# Patient Record
Sex: Male | Born: 1935 | Race: White | Hispanic: No | Marital: Single | Smoking: Former smoker
Health system: Southern US, Community
[De-identification: ages and names within clinical notes are randomized; demographics above are authoritative.]

## PROBLEM LIST (undated history)

## (undated) DIAGNOSIS — Z7401 Bed confinement status: Secondary | ICD-10-CM

## (undated) DIAGNOSIS — J189 Pneumonia, unspecified organism: Secondary | ICD-10-CM

## (undated) DIAGNOSIS — J45909 Unspecified asthma, uncomplicated: Secondary | ICD-10-CM

## (undated) DIAGNOSIS — K219 Gastro-esophageal reflux disease without esophagitis: Secondary | ICD-10-CM

## (undated) DIAGNOSIS — I10 Essential (primary) hypertension: Secondary | ICD-10-CM

## (undated) DIAGNOSIS — F0390 Unspecified dementia without behavioral disturbance: Secondary | ICD-10-CM

## (undated) DIAGNOSIS — Z66 Do not resuscitate: Secondary | ICD-10-CM

## (undated) DIAGNOSIS — E785 Hyperlipidemia, unspecified: Secondary | ICD-10-CM

## (undated) DIAGNOSIS — F419 Anxiety disorder, unspecified: Secondary | ICD-10-CM

---

## 2003-06-19 ENCOUNTER — Other Ambulatory Visit: Payer: Self-pay

## 2006-02-10 ENCOUNTER — Emergency Department: Payer: Self-pay | Admitting: Emergency Medicine

## 2006-02-21 ENCOUNTER — Emergency Department: Payer: Self-pay | Admitting: Emergency Medicine

## 2007-05-29 ENCOUNTER — Emergency Department: Payer: Self-pay | Admitting: Emergency Medicine

## 2008-02-04 ENCOUNTER — Emergency Department: Payer: Self-pay | Admitting: Emergency Medicine

## 2008-02-05 ENCOUNTER — Emergency Department (HOSPITAL_COMMUNITY): Admission: EM | Admit: 2008-02-05 | Discharge: 2008-02-05 | Payer: Self-pay | Admitting: Emergency Medicine

## 2009-07-25 ENCOUNTER — Ambulatory Visit: Payer: Self-pay | Admitting: Internal Medicine

## 2011-04-06 ENCOUNTER — Emergency Department (HOSPITAL_COMMUNITY)
Admission: EM | Admit: 2011-04-06 | Discharge: 2011-04-06 | Disposition: A | Discharge status: Home / Self Care | Payer: Medicare Other | Attending: Emergency Medicine | Admitting: Emergency Medicine

## 2011-04-06 ENCOUNTER — Emergency Department (HOSPITAL_COMMUNITY): Payer: Medicare Other

## 2011-04-06 DIAGNOSIS — R55 Syncope and collapse: Secondary | ICD-10-CM | POA: Insufficient documentation

## 2011-04-06 DIAGNOSIS — R5381 Other malaise: Secondary | ICD-10-CM | POA: Insufficient documentation

## 2011-04-06 DIAGNOSIS — R42 Dizziness and giddiness: Secondary | ICD-10-CM | POA: Insufficient documentation

## 2011-04-06 LAB — CK TOTAL AND CKMB (NOT AT ARMC)
CK, MB: 1.6 ng/mL (ref 0.3–4.0)
Total CK: 55 U/L (ref 7–232)

## 2011-04-06 LAB — DIFFERENTIAL
Basophils Absolute: 0 10*3/uL (ref 0.0–0.1)
Basophils Relative: 0 % (ref 0–1)
Eosinophils Absolute: 0.1 10*3/uL (ref 0.0–0.7)
Eosinophils Relative: 1 % (ref 0–5)
Monocytes Absolute: 1 10*3/uL (ref 0.1–1.0)
Neutro Abs: 6.8 10*3/uL (ref 1.7–7.7)

## 2011-04-06 LAB — URINALYSIS, ROUTINE W REFLEX MICROSCOPIC
Hgb urine dipstick: NEGATIVE
Protein, ur: >300 mg/dL — AB
Urobilinogen, UA: 1 mg/dL (ref 0.0–1.0)

## 2011-04-06 LAB — CBC
Hemoglobin: 13.2 g/dL (ref 13.0–17.0)
MCHC: 35.3 g/dL (ref 30.0–36.0)
Platelets: 280 10*3/uL (ref 150–400)
RDW: 13.6 % (ref 11.5–15.5)

## 2011-04-06 LAB — POCT I-STAT TROPONIN I: Troponin i, poc: 0 ng/mL (ref 0.00–0.08)

## 2011-04-06 LAB — POCT I-STAT, CHEM 8
Calcium, Ion: 1.12 mmol/L (ref 1.12–1.32)
Chloride: 99 mEq/L (ref 96–112)
HCT: 37 % — ABNORMAL LOW (ref 39.0–52.0)
Hemoglobin: 12.6 g/dL — ABNORMAL LOW (ref 13.0–17.0)

## 2011-04-06 LAB — URINE MICROSCOPIC-ADD ON

## 2011-04-07 LAB — URINE CULTURE: Culture: NO GROWTH

## 2011-05-08 LAB — POCT CARDIAC MARKERS
CKMB, poc: <1 — ABNORMAL LOW
Myoglobin, poc: 37.3
Operator id: 133351

## 2011-09-29 ENCOUNTER — Emergency Department: Payer: Self-pay | Admitting: Emergency Medicine

## 2011-09-29 LAB — URINALYSIS, COMPLETE
Bilirubin,UR: NEGATIVE
Glucose,UR: 150 mg/dL (ref 0–75)
Hyaline Cast: 3
Ph: 6 (ref 4.5–8.0)
Protein: 100
RBC,UR: 4 /HPF (ref 0–5)
Specific Gravity: 1.019 (ref 1.003–1.030)
WBC UR: 2 /HPF (ref 0–5)

## 2011-09-29 LAB — COMPREHENSIVE METABOLIC PANEL
Albumin: 3.8 g/dL (ref 3.4–5.0)
Anion Gap: 11 (ref 7–16)
BUN: 8 mg/dL (ref 7–18)
Chloride: 100 mmol/L (ref 98–107)
Creatinine: 0.75 mg/dL (ref 0.60–1.30)
Glucose: 100 mg/dL — ABNORMAL HIGH (ref 65–99)
Osmolality: 278 (ref 275–301)
Potassium: 3.4 mmol/L — ABNORMAL LOW (ref 3.5–5.1)
SGPT (ALT): 18 U/L
Sodium: 140 mmol/L (ref 136–145)
Total Protein: 7.9 g/dL (ref 6.4–8.2)

## 2011-09-29 LAB — CBC
HCT: 40.7 % (ref 40.0–52.0)
MCH: 31.4 pg (ref 26.0–34.0)
MCHC: 34.3 g/dL (ref 32.0–36.0)
Platelet: 261 10*3/uL (ref 150–440)
RDW: 14.1 % (ref 11.5–14.5)

## 2011-09-29 LAB — TROPONIN I: Troponin-I: 0.04 ng/mL

## 2011-09-29 LAB — CK TOTAL AND CKMB (NOT AT ARMC)
CK, Total: 84 U/L (ref 35–232)
CK-MB: 1.1 ng/mL (ref 0.5–3.6)

## 2012-02-13 LAB — COMPREHENSIVE METABOLIC PANEL
Alkaline Phosphatase: 83 U/L (ref 50–136)
BUN: 6 mg/dL — ABNORMAL LOW (ref 7–18)
Calcium, Total: 9.3 mg/dL (ref 8.5–10.1)
Chloride: 100 mmol/L (ref 98–107)
Co2: 32 mmol/L (ref 21–32)
EGFR (Non-African Amer.): >60
SGPT (ALT): 15 U/L
Sodium: 140 mmol/L (ref 136–145)

## 2012-02-13 LAB — URINALYSIS, COMPLETE
Bacteria: NONE SEEN
Blood: NEGATIVE
Glucose,UR: 150 mg/dL (ref 0–75)
Leukocyte Esterase: NEGATIVE
Nitrite: NEGATIVE
RBC,UR: 8 /HPF (ref 0–5)
WBC UR: <1 /HPF (ref 0–5)

## 2012-02-13 LAB — CBC
HGB: 14.1 g/dL (ref 13.0–18.0)
MCHC: 32.8 g/dL (ref 32.0–36.0)
Platelet: 267 10*3/uL (ref 150–440)
RDW: 13.8 % (ref 11.5–14.5)

## 2012-02-13 LAB — LIPASE, BLOOD: Lipase: 123 U/L (ref 73–393)

## 2012-02-14 ENCOUNTER — Inpatient Hospital Stay: Payer: Self-pay | Admitting: Surgery

## 2012-02-14 LAB — BASIC METABOLIC PANEL
Anion Gap: 7 (ref 7–16)
BUN: 5 mg/dL — ABNORMAL LOW (ref 7–18)
Creatinine: 1.01 mg/dL (ref 0.60–1.30)
EGFR (African American): >60
EGFR (Non-African Amer.): >60
Glucose: 130 mg/dL — ABNORMAL HIGH (ref 65–99)
Sodium: 136 mmol/L (ref 136–145)

## 2012-02-14 LAB — CBC WITH DIFFERENTIAL/PLATELET
Basophil %: 0.4 %
Eosinophil #: 0.1 10*3/uL (ref 0.0–0.7)
Eosinophil %: 0.7 %
HCT: 41.9 % (ref 40.0–52.0)
HGB: 13.7 g/dL (ref 13.0–18.0)
Lymphocyte #: 1.7 10*3/uL (ref 1.0–3.6)
MCH: 30.5 pg (ref 26.0–34.0)
MCHC: 32.7 g/dL (ref 32.0–36.0)
MCV: 93 fL (ref 80–100)
Monocyte #: 1.3 x10 3/mm — ABNORMAL HIGH (ref 0.2–1.0)
Neutrophil #: 7.3 10*3/uL — ABNORMAL HIGH (ref 1.4–6.5)
Neutrophil %: 70.1 %
RDW: 14 % (ref 11.5–14.5)

## 2012-02-15 LAB — COMPREHENSIVE METABOLIC PANEL WITH GFR
Albumin: 3 g/dL — ABNORMAL LOW
Alkaline Phosphatase: 80 U/L
Anion Gap: 9
BUN: 4 mg/dL — ABNORMAL LOW
Bilirubin,Total: 1.3 mg/dL — ABNORMAL HIGH
Calcium, Total: 8.2 mg/dL — ABNORMAL LOW
Chloride: 102 mmol/L
Co2: 26 mmol/L
Creatinine: 0.79 mg/dL
EGFR (African American): >60
EGFR (Non-African Amer.): >60
Glucose: 115 mg/dL — ABNORMAL HIGH
Osmolality: 272
Potassium: 3.6 mmol/L
SGOT(AST): 17 U/L
SGPT (ALT): 10 U/L — ABNORMAL LOW
Sodium: 137 mmol/L
Total Protein: 6.9 g/dL

## 2012-02-15 LAB — CBC WITH DIFFERENTIAL/PLATELET
Basophil #: 0 x10 3/mm 3
Basophil %: 0.5 %
Eosinophil #: 0.1 x10 3/mm 3
Eosinophil %: 1.3 %
HCT: 38 % — ABNORMAL LOW
HGB: 13.1 g/dL
Lymphocyte %: 12 %
Lymphs Abs: 1.2 x10 3/mm 3
MCH: 31.8 pg
MCHC: 34.3 g/dL
MCV: 93 fL
Monocyte #: 1.3 "x10 3/mm " — ABNORMAL HIGH
Monocyte %: 13.2 %
Neutrophil #: 7.3 x10 3/mm 3 — ABNORMAL HIGH
Neutrophil %: 73 %
Platelet: 195 x10 3/mm 3
RBC: 4.1 x10 6/mm 3 — ABNORMAL LOW
RDW: 13.8 %
WBC: 10 x10 3/mm 3

## 2012-02-17 LAB — CBC WITH DIFFERENTIAL/PLATELET
Basophil %: 0.9 %
Eosinophil #: 0.4 10*3/uL (ref 0.0–0.7)
Eosinophil %: 6.5 %
Lymphocyte #: 1.9 10*3/uL (ref 1.0–3.6)
Lymphocyte %: 29.8 %
MCH: 31.5 pg (ref 26.0–34.0)
MCHC: 33.9 g/dL (ref 32.0–36.0)
MCV: 93 fL (ref 80–100)
Monocyte #: 0.7 x10 3/mm (ref 0.2–1.0)
Monocyte %: 11.4 %
Neutrophil %: 51.4 %
Platelet: 192 10*3/uL (ref 150–440)
RBC: 3.83 10*6/uL — ABNORMAL LOW (ref 4.40–5.90)
RDW: 14.2 % (ref 11.5–14.5)
WBC: 6.3 10*3/uL (ref 3.8–10.6)

## 2012-02-17 LAB — COMPREHENSIVE METABOLIC PANEL
Albumin: 2.6 g/dL — ABNORMAL LOW (ref 3.4–5.0)
Alkaline Phosphatase: 70 U/L (ref 50–136)
BUN: 4 mg/dL — ABNORMAL LOW (ref 7–18)
Bilirubin,Total: 0.8 mg/dL (ref 0.2–1.0)
Co2: 26 mmol/L (ref 21–32)
Creatinine: 0.73 mg/dL (ref 0.60–1.30)
EGFR (Non-African Amer.): >60
Glucose: 88 mg/dL (ref 65–99)
Osmolality: 278 (ref 275–301)
Potassium: 3.9 mmol/L (ref 3.5–5.1)
SGPT (ALT): 12 U/L
Sodium: 141 mmol/L (ref 136–145)
Total Protein: 6.7 g/dL (ref 6.4–8.2)

## 2013-02-12 ENCOUNTER — Emergency Department (HOSPITAL_COMMUNITY)
Admission: EM | Admit: 2013-02-12 | Discharge: 2013-02-12 | Disposition: A | Discharge status: Home / Self Care | Payer: Medicare Other | Attending: Emergency Medicine | Admitting: Emergency Medicine

## 2013-02-12 ENCOUNTER — Emergency Department (HOSPITAL_COMMUNITY): Payer: Medicare Other

## 2013-02-12 ENCOUNTER — Encounter (HOSPITAL_COMMUNITY): Payer: Self-pay | Admitting: *Deleted

## 2013-02-12 DIAGNOSIS — Z79899 Other long term (current) drug therapy: Secondary | ICD-10-CM | POA: Insufficient documentation

## 2013-02-12 DIAGNOSIS — R509 Fever, unspecified: Secondary | ICD-10-CM | POA: Insufficient documentation

## 2013-02-12 DIAGNOSIS — Y9389 Activity, other specified: Secondary | ICD-10-CM | POA: Insufficient documentation

## 2013-02-12 DIAGNOSIS — IMO0002 Reserved for concepts with insufficient information to code with codable children: Secondary | ICD-10-CM | POA: Insufficient documentation

## 2013-02-12 DIAGNOSIS — W19XXXA Unspecified fall, initial encounter: Secondary | ICD-10-CM

## 2013-02-12 DIAGNOSIS — K219 Gastro-esophageal reflux disease without esophagitis: Secondary | ICD-10-CM | POA: Insufficient documentation

## 2013-02-12 DIAGNOSIS — S40012A Contusion of left shoulder, initial encounter: Secondary | ICD-10-CM

## 2013-02-12 DIAGNOSIS — S0003XA Contusion of scalp, initial encounter: Secondary | ICD-10-CM | POA: Insufficient documentation

## 2013-02-12 DIAGNOSIS — Y9289 Other specified places as the place of occurrence of the external cause: Secondary | ICD-10-CM | POA: Insufficient documentation

## 2013-02-12 DIAGNOSIS — S0083XA Contusion of other part of head, initial encounter: Secondary | ICD-10-CM

## 2013-02-12 DIAGNOSIS — Z7982 Long term (current) use of aspirin: Secondary | ICD-10-CM | POA: Insufficient documentation

## 2013-02-12 DIAGNOSIS — S40019A Contusion of unspecified shoulder, initial encounter: Secondary | ICD-10-CM | POA: Insufficient documentation

## 2013-02-12 DIAGNOSIS — F0390 Unspecified dementia without behavioral disturbance: Secondary | ICD-10-CM | POA: Insufficient documentation

## 2013-02-12 DIAGNOSIS — R296 Repeated falls: Secondary | ICD-10-CM | POA: Insufficient documentation

## 2013-02-12 HISTORY — DX: Unspecified dementia, unspecified severity, without behavioral disturbance, psychotic disturbance, mood disturbance, and anxiety: F03.90

## 2013-02-12 HISTORY — DX: Gastro-esophageal reflux disease without esophagitis: K21.9

## 2013-02-12 NOTE — ED Notes (Signed)
Family member states pt was outside earlier and fell (usure what caused the fall) Pain to left arm, limited mobility per family member. C/O right leg hurting earlier per family member. Abrasions to the face. NAD. Temp of 100.5 in triage.

## 2013-02-12 NOTE — ED Provider Notes (Signed)
History    CSN: [id number] Arrival date & time 02/12/13  1657  First MD Initiated Contact with Patient 02/12/13 1727     Chief Complaint  Patient presents with  . Fall  . Fever   (Consider location/radiation/quality/duration/timing/severity/associated sxs/prior Treatment) Patient is a 79 y.o. male presenting with fall and fever. The history is provided by a relative (pt fell in the grass and scraped his face and has left shoulder pain).  Fall This is a new problem. The current episode started 6 to 12 hours ago. The problem occurs constantly. The problem has not changed since onset.The symptoms are aggravated by bending. Nothing relieves the symptoms.  Fever  Past Medical History  Diagnosis Date  . Dementia   . GERD (gastroesophageal reflux disease)    History reviewed. No pertinent past surgical history. No family history on file. History  Substance Use Topics  . Smoking status: Never Smoker   . Smokeless tobacco: Not on file  . Alcohol Use: No    Review of Systems  Unable to perform ROS: Dementia    Allergies  Review of patient's allergies indicates no known allergies.  Home Medications   Current Outpatient Rx  Name  Route  Sig  Dispense  Refill  . aspirin EC 81 MG tablet   Oral   Take 81 mg by mouth daily.         . mirtazapine (REMERON) 15 MG tablet   Oral   Take 15 mg by mouth at bedtime.         Marland Kitchen omeprazole (PRILOSEC) 20 MG capsule   Oral   Take 20 mg by mouth daily.         . risperiDONE (RISPERDAL) 1 MG tablet   Oral   Take 1 mg by mouth at bedtime.         . senna (SENOKOT) 8.6 MG tablet   Oral   Take 2 tablets by mouth 2 (two) times daily.          BP 144/79  Pulse 93  Temp(Src) 100.5 F (38.1 C) (Oral)  Resp 20  Ht 5\' 7"  (1.702 m)  Wt 155 lb (70.308 kg)  BMI 24.27 kg/m2  SpO2 98% Physical Exam  Constitutional: He is oriented to person, place, and time. He appears well-developed.  HENT:  Abrasions to face  Eyes:  Conjunctivae and EOM are normal. No scleral icterus.  Neck: Neck supple. No thyromegaly present.  Cardiovascular: Normal rate and regular rhythm.  Exam reveals no gallop and no friction rub.   No murmur heard. Pulmonary/Chest: No stridor. He has no wheezes. He has no rales. He exhibits no tenderness.  Abdominal: He exhibits no distension. There is no tenderness. There is no rebound.  Musculoskeletal: He exhibits no edema.  Tender left shoulder  Lymphadenopathy:    He has no cervical adenopathy.  Neurological: He is oriented to person, place, and time. Coordination normal.  Skin: No rash noted. No erythema.  Psychiatric: He has a normal mood and affect. His behavior is normal.    ED Course  Procedures (including critical care time) Labs Reviewed - No data to display Dg Elbow Complete Left  02/12/2013   *RADIOLOGY REPORT*  Clinical Data: Elbow pain after fall.  LEFT ELBOW - COMPLETE 3+ VIEW  Comparison: None.  Findings: No fracture, foreign body, or acute bony findings are identified.  No elbow effusion noted.  IMPRESSION:  No significant abnormality identified.   Original Report Authenticated By: Gaylyn Rong, M.D.  Dg Shoulder Left  02/12/2013   *RADIOLOGY REPORT*  Clinical Data: Left shoulder pain. Fall.  LEFT SHOULDER - 2+ VIEW  Comparison: 02/05/2008  Findings: No fracture, dislocation, or acute bony findings identified.  IMPRESSION:  1.  No acute bony findings are identified.   Original Report Authenticated By: Gaylyn Rong, M.D.   1. Fall, initial encounter   2. Facial contusion, initial encounter   3. Shoulder contusion, left, initial encounter     MDM    Benny Lennert, MD 02/12/13 [phone]

## 2013-05-16 ENCOUNTER — Other Ambulatory Visit: Payer: Self-pay

## 2013-05-16 ENCOUNTER — Observation Stay (HOSPITAL_COMMUNITY)
Admission: EM | Admit: 2013-05-16 | Discharge: 2013-05-18 | Disposition: A | Discharge status: Home / Self Care | Payer: Medicare Other | Attending: Internal Medicine | Admitting: Internal Medicine

## 2013-05-16 ENCOUNTER — Encounter (HOSPITAL_COMMUNITY): Payer: Self-pay | Admitting: Emergency Medicine

## 2013-05-16 DIAGNOSIS — F0390 Unspecified dementia without behavioral disturbance: Secondary | ICD-10-CM | POA: Diagnosis present

## 2013-05-16 DIAGNOSIS — R42 Dizziness and giddiness: Secondary | ICD-10-CM | POA: Insufficient documentation

## 2013-05-16 DIAGNOSIS — K219 Gastro-esophageal reflux disease without esophagitis: Secondary | ICD-10-CM | POA: Diagnosis present

## 2013-05-16 DIAGNOSIS — R5381 Other malaise: Secondary | ICD-10-CM | POA: Insufficient documentation

## 2013-05-16 DIAGNOSIS — R404 Transient alteration of awareness: Principal | ICD-10-CM | POA: Insufficient documentation

## 2013-05-16 DIAGNOSIS — Z7982 Long term (current) use of aspirin: Secondary | ICD-10-CM | POA: Insufficient documentation

## 2013-05-16 DIAGNOSIS — Z79899 Other long term (current) drug therapy: Secondary | ICD-10-CM | POA: Insufficient documentation

## 2013-05-16 DIAGNOSIS — R55 Syncope and collapse: Secondary | ICD-10-CM | POA: Diagnosis present

## 2013-05-16 DIAGNOSIS — R0602 Shortness of breath: Secondary | ICD-10-CM | POA: Insufficient documentation

## 2013-05-16 DIAGNOSIS — F411 Generalized anxiety disorder: Secondary | ICD-10-CM | POA: Insufficient documentation

## 2013-05-16 DIAGNOSIS — F419 Anxiety disorder, unspecified: Secondary | ICD-10-CM

## 2013-05-16 DIAGNOSIS — R079 Chest pain, unspecified: Secondary | ICD-10-CM | POA: Insufficient documentation

## 2013-05-16 HISTORY — DX: Anxiety disorder, unspecified: F41.9

## 2013-05-16 LAB — COMPREHENSIVE METABOLIC PANEL
ALT: 9 U/L (ref 0–53)
AST: 14 U/L (ref 0–37)
Albumin: 3.6 g/dL (ref 3.5–5.2)
Alkaline Phosphatase: 94 U/L (ref 39–117)
Calcium: 9.7 mg/dL (ref 8.4–10.5)
GFR calc Af Amer: >90 mL/min (ref >90)
Glucose, Bld: 134 mg/dL — ABNORMAL HIGH (ref 70–99)
Potassium: 4.2 mEq/L (ref 3.5–5.1)
Sodium: 136 mEq/L (ref 135–145)
Total Protein: 8 g/dL (ref 6.0–8.3)

## 2013-05-16 LAB — CBC WITH DIFFERENTIAL/PLATELET
Basophils Absolute: 0 10*3/uL (ref 0.0–0.1)
Eosinophils Absolute: 0.1 10*3/uL (ref 0.0–0.7)
Eosinophils Relative: 1 % (ref 0–5)
Lymphs Abs: 1.6 10*3/uL (ref 0.7–4.0)
MCH: 31.6 pg (ref 26.0–34.0)
Neutrophils Relative %: 71 % (ref 43–77)
Platelets: 287 10*3/uL (ref 150–400)
RBC: 4.87 MIL/uL (ref 4.22–5.81)
RDW: 13.8 % (ref 11.5–15.5)
WBC: 8.5 10*3/uL (ref 4.0–10.5)

## 2013-05-16 NOTE — ED Notes (Signed)
Patient presents to ER via [facility] s/p syncope.  Patient was in shower and when he got out, he called out for his daughter and had a syncopal episode.  Patient was caught by family; denies hitting head.  CBG 109.

## 2013-05-16 NOTE — ED Provider Notes (Signed)
CSN: [id number]     Arrival date & time 05/16/13  2130 History   None    Chief Complaint  Patient presents with  . Loss of Consciousness   (Consider location/radiation/quality/duration/timing/severity/associated sxs/prior Treatment) Patient is a 79 y.o. male presenting with syncope. The history is provided by the patient and the EMS personnel. No language interpreter was used.  Loss of Consciousness Episode history:  Single Most recent episode:  Today Progression:  Worsening Chronicity:  New Witnessed: yes   Relieved by:  Nothing Worsened by:  Nothing tried Ineffective treatments:  None tried Associated symptoms: chest pain and weakness   Pt reports he was taking a shower and had pain in his chest.   EMS reports family told them pt was weak and almost   Pt denies any current chest pain.     Past Medical History  Diagnosis Date  . Dementia   . GERD (gastroesophageal reflux disease)    History reviewed. No pertinent past surgical history. No family history on file. History  Substance Use Topics  . Smoking status: Never Smoker   . Smokeless tobacco: Not on file  . Alcohol Use: No    Review of Systems  Cardiovascular: Positive for chest pain and syncope.  Neurological: Positive for weakness.  All other systems reviewed and are negative.    Allergies  Review of patient's allergies indicates no known allergies.  Home Medications   Current Outpatient Rx  Name  Route  Sig  Dispense  Refill  . aspirin EC 81 MG tablet   Oral   Take 81 mg by mouth daily.         . mirtazapine (REMERON) 15 MG tablet   Oral   Take 15 mg by mouth at bedtime.         Marland Kitchen omeprazole (PRILOSEC) 20 MG capsule   Oral   Take 20 mg by mouth daily.         . risperiDONE (RISPERDAL) 1 MG tablet   Oral   Take 1 mg by mouth at bedtime.         . senna (SENOKOT) 8.6 MG tablet   Oral   Take 2 tablets by mouth 2 (two) times daily.          BP 142/70  Pulse 60  Temp(Src) 98.6 F (37  C) (Oral)  Resp 20  Wt 150 lb (68.04 kg)  BMI 23.49 kg/m2  SpO2 98% Physical Exam  Nursing note and vitals reviewed. Constitutional: He appears well-developed and well-nourished.  HENT:  Head: Normocephalic and atraumatic.  Mouth/Throat: Oropharynx is clear and moist.  Very hard of hearing  Eyes: Pupils are equal, round, and reactive to light.  Neck: Normal range of motion.  Cardiovascular:  irregular  Pulmonary/Chest: Effort normal and breath sounds normal.  Abdominal: Soft.  Musculoskeletal: Normal range of motion.  Neurological: He is alert.  Skin: Skin is warm.  Psychiatric: He has a normal mood and affect.    ED Course  Procedures (including critical care time) Labs Review Labs Reviewed - No data to display Imaging Review No results found.  MDM   1. Near syncope     Date: 05/16/2013  Rate: 64  Rhythm: sinus arrhythmia  QRS Axis: normal  Intervals: normal  ST/T Wave abnormalities: normal  Conduction Disutrbances:none  Narrative Interpretation:   Old EKG Reviewed: none available  Pt followed by [facility]  I spoke to Dr. Orvan Falconer hospitalist who will admit  Elson Areas, PA-C 05/16/13  [address]  [place] Sk[place], [place] 05/16/13 2353

## 2013-05-17 ENCOUNTER — Encounter (HOSPITAL_COMMUNITY): Payer: Self-pay

## 2013-05-17 DIAGNOSIS — F419 Anxiety disorder, unspecified: Secondary | ICD-10-CM | POA: Diagnosis present

## 2013-05-17 DIAGNOSIS — K219 Gastro-esophageal reflux disease without esophagitis: Secondary | ICD-10-CM | POA: Diagnosis present

## 2013-05-17 DIAGNOSIS — R55 Syncope and collapse: Secondary | ICD-10-CM | POA: Diagnosis present

## 2013-05-17 DIAGNOSIS — F0390 Unspecified dementia without behavioral disturbance: Secondary | ICD-10-CM | POA: Diagnosis present

## 2013-05-17 LAB — CBC
HCT: 41.3 % (ref 39.0–52.0)
MCH: 31 pg (ref 26.0–34.0)
MCHC: 33.7 g/dL (ref 30.0–36.0)
MCV: 92.2 fL (ref 78.0–100.0)
RDW: 13.9 % (ref 11.5–15.5)
WBC: 8.2 10*3/uL (ref 4.0–10.5)

## 2013-05-17 LAB — URINALYSIS, ROUTINE W REFLEX MICROSCOPIC
Bilirubin Urine: NEGATIVE
Glucose, UA: NEGATIVE mg/dL
Specific Gravity, Urine: 1.02 (ref 1.005–1.030)
Urobilinogen, UA: 0.2 mg/dL (ref 0.0–1.0)
pH: 6 (ref 5.0–8.0)

## 2013-05-17 LAB — COMPREHENSIVE METABOLIC PANEL
Albumin: 3.3 g/dL — ABNORMAL LOW (ref 3.5–5.2)
Alkaline Phosphatase: 81 U/L (ref 39–117)
BUN: 18 mg/dL (ref 6–23)
Creatinine, Ser: 0.78 mg/dL (ref 0.50–1.35)
GFR calc non Af Amer: 83 mL/min — ABNORMAL LOW (ref >90)
Total Protein: 7 g/dL (ref 6.0–8.3)

## 2013-05-17 LAB — HEMOGLOBIN A1C: Hgb A1c MFr Bld: 6 % — ABNORMAL HIGH (ref <5.7)

## 2013-05-17 LAB — TSH: TSH: 0.863 u[IU]/mL (ref 0.350–4.500)

## 2013-05-17 LAB — MAGNESIUM: Magnesium: 2 mg/dL (ref 1.5–2.5)

## 2013-05-17 LAB — TROPONIN I: Troponin I: <0.3 ng/mL (ref <0.30)

## 2013-05-17 LAB — URINE MICROSCOPIC-ADD ON

## 2013-05-17 MED ORDER — SODIUM CHLORIDE 0.9 % IJ SOLN
3.0000 mL | Freq: Two times a day (BID) | INTRAMUSCULAR | Status: DC
  Administered 2013-05-17 – 2013-05-18 (×2): 3 mL via INTRAVENOUS

## 2013-05-17 MED ORDER — ALBUTEROL SULFATE HFA 108 (90 BASE) MCG/ACT IN AERS: 2.0000 | INHALATION_SPRAY | Freq: Four times a day (QID) | RESPIRATORY_TRACT | Status: DC | PRN

## 2013-05-17 MED ORDER — ONDANSETRON HCL 4 MG/2ML IJ SOLN: 4.0000 mg | INTRAMUSCULAR | Status: DC | PRN

## 2013-05-17 MED ORDER — ACETAMINOPHEN 325 MG PO TABS: 650.0000 mg | ORAL_TABLET | ORAL | Status: DC | PRN

## 2013-05-17 MED ORDER — ASPIRIN EC 81 MG PO TBEC
81.0000 mg | DELAYED_RELEASE_TABLET | Freq: Every morning | ORAL | Status: DC
  Administered 2013-05-17 – 2013-05-18 (×2): 81 mg via ORAL
  Filled 2013-05-17 (×2): qty 1

## 2013-05-17 MED ORDER — PANTOPRAZOLE SODIUM 40 MG PO TBEC
40.0000 mg | DELAYED_RELEASE_TABLET | Freq: Every day | ORAL | Status: DC
  Administered 2013-05-17 – 2013-05-18 (×2): 40 mg via ORAL
  Filled 2013-05-17 (×2): qty 1

## 2013-05-17 MED ORDER — RISPERIDONE 1 MG PO TABS
1.0000 mg | ORAL_TABLET | Freq: Every day | ORAL | Status: DC
  Administered 2013-05-17: 1 mg via ORAL
  Filled 2013-05-17: qty 1

## 2013-05-17 MED ORDER — ENOXAPARIN SODIUM 40 MG/0.4ML ~~LOC~~ SOLN
40.0000 mg | SUBCUTANEOUS | Status: DC
  Administered 2013-05-17 – 2013-05-18 (×2): 40 mg via SUBCUTANEOUS
  Filled 2013-05-17 (×2): qty 0.4

## 2013-05-17 MED ORDER — MIRTAZAPINE 30 MG PO TABS
15.0000 mg | ORAL_TABLET | Freq: Every day | ORAL | Status: DC
  Administered 2013-05-17: 15 mg via ORAL
  Filled 2013-05-17: qty 1

## 2013-05-17 MED ORDER — POTASSIUM CHLORIDE IN NACL 20-0.9 MEQ/L-% IV SOLN
INTRAVENOUS | Status: DC
  Administered 2013-05-17: 05:00:00 via INTRAVENOUS

## 2013-05-17 MED ORDER — FLEET ENEMA 7-19 GM/118ML RE ENEM: 1.0000 | ENEMA | Freq: Once | RECTAL | Status: AC | PRN

## 2013-05-17 NOTE — Progress Notes (Signed)
UR Chart Review Completed  

## 2013-05-17 NOTE — Progress Notes (Signed)
[facility] PROGRESS NOTE  Yashas Camilli RUE:[id number] DOB: 01-Nov-1930 DOA: 05/16/2013 PCP: No primary provider on file.  Assessment/Plan: 1. Syncope versus presyncope: Orthostatic blood pressures were negative. Orthostatic pulse equivocal. 2. Possible chest pain: patient denies this now. History unreliable.  3. Dementia:Appears to be stable.  4. Generalized weakness: Nonfocal exam. Physical therapy consultation.   Check orthostatics  Discontinue IV fluids  Followup 2-D echocardiogram   Physical therapy consultation  Would anticipate discharge within the next 24 hours  Pending studies:   TSH  hemoglobin A1c  Code Status: Full code DVT prophylaxis: Lovenox Family Communication: None present Disposition Plan: Home  Brendia Sacks, MD  [facility]  Pager [phone] If 7PM-7AM, please contact night-coverage at [email or web address], password [facility] 05/17/2013, 11:14 AM  LOS: 1 day   Summary: 79 year old man presented to the emergency department with history of syncope with associated chest pain.  Consultants:    Procedures:  2-D echocardiogram  HPI/Subjective: No complaints. No chest pain or shortness of breath. He does remember passing out yesterday. He had lightheadedness before he passed out. History appears to be unreliable. He has no memory of chest pain. Admission history and physical is not available.  Objective: Filed Vitals:   05/17/13 0112 05/17/13 0803 05/17/13 0805 05/17/13 0806  BP: 144/70 111/57 125/78 144/72  Pulse: 60 59 84 88  Temp: 98.4 F (36.9 C) 98.2 F (36.8 C) 98.3 F (36.8 C) 98 F (36.7 C)  TempSrc: Oral Oral Oral Oral  Resp: 18 18 18 20   Height:    6' (1.829 m)  Weight: 65.7 kg (144 lb 13.5 oz)     SpO2: 100% 98% 99% 95%    Intake/Output Summary (Last 24 hours) at 05/17/13 1114 Last data filed at 05/17/13 0855  Gross per 24 hour  Intake    215 ml  Output    350 ml  Net   -135 ml     Filed Weights   05/16/13 2132  05/17/13 0112  Weight: 68.04 kg (150 lb) 65.7 kg (144 lb 13.5 oz)    Exam:   Afebrile, vital signs stable.  General: Appears calm and comfortable. Speech fluent and clear.  Psychiatric: Grossly normal mood and affect. Alert. Oriented to self only.  Cardiovascular: Regular rate and rhythm. No murmur, rub, gallop. No lower extremity edema.  Respiratory: Clear to auscultation bilaterally. No wheezes, rales, rhonchi. Normal respiratory effort.  Musculoskeletal: Grossly normal.  Data Reviewed:  CBC unremarkable.  Troponin negative.  Complete metabolic panel unremarkable.  Urinalysis negative.  No imaging  EKG: Sinus rhythm, PVC. No acute changes.  Scheduled Meds: . aspirin EC  81 mg Oral q morning - 10a  . enoxaparin (LOVENOX) injection  40 mg Subcutaneous Q24H  . mirtazapine  15 mg Oral QHS  . pantoprazole  40 mg Oral Daily  . risperiDONE  1 mg Oral QHS  . sodium chloride  3 mL Intravenous Q12H   Continuous Infusions: . 0.9 % NaCl with KCl 20 mEq / L 125 mL/hr at 05/17/13 0507    Principal Problem:   Syncope Active Problems:   Near syncope   Dementia   GERD (gastroesophageal reflux disease)   Anxiety   Time spent 25 minutes

## 2013-05-17 NOTE — Evaluation (Signed)
Physical Therapy Evaluation Patient Details Name: Chad Everett MRN: [id number] DOB: 1930/11/17 Today's Date: 05/17/2013 Time: 4098-1191 PT Time Calculation (min): 28 min  PT Assessment / Plan / Recommendation History of Present Illness  Pt with a hx of dementia is admitted for an episode of syncope at home.  No other hx is available at this time.  Pt is alert and cooperative, extremely HOH and when asked with whom he lives he replies "my mother and my family".  Clinical Impression   Pt was seen for evaluation.  There were no episodes of syncope nor did he acknowledge having any sx of syncope.  His gait is stable with no assistive device for functional distances.  No further PT is needed.    PT Assessment  Patent does not need any further PT services    Follow Up Recommendations  No PT follow up    Does the patient have the potential to tolerate intense rehabilitation      Barriers to Discharge        Equipment Recommendations  None recommended by PT    Recommendations for Other Services     Frequency      Precautions / Restrictions Precautions Precautions: None Restrictions Weight Bearing Restrictions: No   Pertinent Vitals/Pain       Mobility  Bed Mobility Bed Mobility: Supine to Sit;Sit to Supine Supine to Sit: 7: Independent;HOB flat Sit to Supine: HOB flat Transfers Transfers: Sit to Stand;Stand to Sit Sit to Stand: 7: Independent;From bed;Without upper extremity assist Stand to Sit: 7: Independent;To bed;Without upper extremity assist Ambulation/Gait Ambulation/Gait Assistance: 7: Independent Ambulation Distance (Feet): 250 Feet Assistive device: None Gait Pattern: Trunk flexed;Decreased hip/knee flexion - right;Decreased hip/knee flexion - left Gait velocity: WNL Stairs: No Wheelchair Mobility Wheelchair Mobility: No    Exercises     PT Diagnosis:    PT Problem List:   PT Treatment Interventions:       PT Goals(Current goals can be found in the  care plan section) Acute Rehab PT Goals PT Goal Formulation: No goals set, d/c therapy  Visit Information  Last PT Received On: 05/17/13 History of Present Illness: Pt with a hx of dementia is admitted for an episode of syncope at home.  No other hx is available at this time.  Pt is alert and cooperative, extremely HOH and when asked with whom he lives he replies "my mother and my family".       Prior Functioning  Home Living Family/patient expects to be discharged to:: Private residence Additional Comments: pt is unable to provide information above Prior Function Level of Independence: Independent Comments: this is assumed Communication Communication: Surveyor, mining Arousal/Alertness: Awake/alert Behavior During Therapy: WFL for tasks assessed/performed Overall Cognitive Status: History of cognitive impairments - at baseline    Extremity/Trunk Assessment Lower Extremity Assessment Lower Extremity Assessment: Overall WFL for tasks assessed Cervical / Trunk Assessment Cervical / Trunk Assessment: Kyphotic   Balance Balance Balance Assessed: Yes High Level Balance High Level Balance Activites: Side stepping;Backward walking;Direction changes;Turns;Sudden stops;Head turns High Level Balance Comments: no LOB with above activities  End of Session PT - End of Session Equipment Utilized During Treatment: Gait belt Activity Tolerance: Patient tolerated treatment well Patient left: in bed;with call bell/phone within reach;with bed alarm set;with nursing/sitter in room Nurse Communication: Mobility status  GP Functional Assessment Tool Used: clinical judgement Functional Limitation: Mobility: Walking and moving around Mobility: Walking and Moving Around Current Status ([id number]): 0 percent impaired,  limited or restricted Mobility: Walking and Moving Around Goal Status [phone]): 0 percent impaired, limited or restricted Mobility: Walking and Moving Around Discharge Status  [phone]): 0 percent impaired, limited or restricted   Myrlene Broker L 05/17/2013, 1:20 PM

## 2013-05-17 NOTE — H&P (Signed)
[facility] History and Physical  Chad Everett  WUJ:[id number]  DOB: October 14, 1930   DOA: 05/17/2013   PCP:   No primary provider on file.   Chief Complaint:  Syncopal in shower  HPI: Chad Everett is a 79 y.o. male.   Elderly Caucasian gentleman with dementia, lives in home with his daughter, who brings him to our emergency room after she heard him in difficulties in the shower. She found them slumped over not passed out but unable to support himself and sweating profusely.  She reports a shower was not particularly hot to than usual; she denies previous similar episode  Episode was apparently associated with chest pain and shortness of breath; no fever cough or cold no nausea, vomiting or diarrhea.   His daughter was present reports that he is now back to its baseline  Rewiew of Systems:   Unable to obtain because of patient's dementia   Past Medical History  Diagnosis Date  . Dementia   . GERD (gastroesophageal reflux disease)   . Anxiety     History reviewed. No pertinent past surgical history.  Medications:  HOME MEDS: Prior to Admission medications   Medication Sig Start Date End Date Taking? Authorizing Provider  albuterol (PROVENTIL HFA;VENTOLIN HFA) 108 (90 BASE) MCG/ACT inhaler Inhale 2 puffs into the lungs every 6 (six) hours as needed for wheezing or shortness of breath.   Yes Historical Provider, MD  aspirin EC 81 MG tablet Take 81 mg by mouth every morning.    Yes Historical Provider, MD  mirtazapine (REMERON) 15 MG tablet Take 15 mg by mouth at bedtime.   Yes Historical Provider, MD  omeprazole (PRILOSEC) 20 MG capsule Take 20 mg by mouth daily.   Yes Historical Provider, MD  risperiDONE (RISPERDAL) 1 MG tablet Take 1 mg by mouth at bedtime.   Yes Historical Provider, MD     Allergies:  Allergies  Allergen Reactions  . Onion     Social History:   reports that he has never smoked. He does not have any smokeless tobacco history on file. He reports that  he does not drink alcohol or use illicit drugs.  Family History: Family History  Problem Relation Age of Onset  . Stroke Sister      Physical Exam: Filed Vitals:   05/16/13 2200 05/16/13 2300 05/17/13 0000 05/17/13 0112  BP: 128/80 100/59 99/38 144/70  Pulse: 62 73  60  Temp:    98.4 F (36.9 C)  TempSrc:    Oral  Resp: 10 18 14 18   Weight:    65.7 kg (144 lb 13.5 oz)  SpO2: 97% 97%  100%   Blood pressure 144/70, pulse 60, temperature 98.4 F (36.9 C), temperature source Oral, resp. rate 18, weight 65.7 kg (144 lb 13.5 oz), SpO2 100.00%. Body mass index is 22.68 kg/(m^2).   GEN:  Pleasant elderly Caucasian gentleman lying bed in no acute distress;  HEENT: Mucous membranes pink and anicteric; PERRLA; EOM intact; no cervical lymphadenopathy nor thyromegaly or carotid bruit; no JVD; Breasts:: Not examined CHEST WALL: No tenderness CHEST: Normal respiration, clear to auscultation bilaterally HEART: Regular rate and rhythm; no murmurs rubs or gallops ABDOMEN:  soft non-tender; no masses, no organomegaly, normal abdominal bowel sounds; no pannus; no intertriginous candida. Rectal Exam: Not done EXTREMITIES:  age-appropriate arthropathy of the hands and knees; no edema; no ulcerations. Genitalia: not examined PULSES: 2+ and symmetric SKIN: Normal hydration no rash or ulceration CNS:  no focal lateralizing neurologic deficit  Labs on Admission:  Basic Metabolic Panel:  Recent Labs Lab 05/16/13 2203  NA 136  K 4.2  CL 97  CO2 29  GLUCOSE 134*  BUN 16  CREATININE 0.84  CALCIUM 9.7   Liver Function Tests:  Recent Labs Lab 05/16/13 2203  AST 14  ALT 9  ALKPHOS 94  BILITOT 1.0  PROT 8.0  ALBUMIN 3.6   No results found for this basename: LIPASE, AMYLASE,  in the last 168 hours No results found for this basename: AMMONIA,  in the last 168 hours CBC:  Recent Labs Lab 05/16/13 2203  WBC 8.5  NEUTROABS 6.0  HGB 15.4  HCT 44.8  MCV 92.0  PLT 287   Cardiac  Enzymes:  Recent Labs Lab 05/16/13 2203  TROPONINI <0.30   BNP: No components found with this basename: POCBNP,  D-dimer: No components found with this basename: D-DIMER,  CBG: No results found for this basename: GLUCAP,  in the last 168 hours  Radiological Exams on Admission: No results found.  EKG: Independently reviewed. Sinus rhythm with occasional PVCs   Assessment/Plan  Principal Problem:   Syncope Active Problems:   Near syncope   Dementia   GERD (gastroesophageal reflux disease)   Anxiety    PLAN: Observe this gentleman overnight for hydration and syncope work up   Other plans as per orders.  Code Status: Full code Family Communication:  Plans discuss with daughter at bedside Disposition Plan:    Leatrice Parilla Nocturnist [facility] Pager [phone]   05/17/2013, 2:57 AM

## 2013-05-17 NOTE — Care Management Note (Addendum)
    Page 1 of 1   05/18/2013     2:09:05 PM   CARE MANAGEMENT NOTE 05/18/2013  Patient:  Chad Everett, Chad Everett   Account Number:  [id number]  Date Initiated:  05/17/2013  Documentation initiated by:  Sharrie Rothman  Subjective/Objective Assessment:   Pt admitted from home with CP and near syncope. Pt lives with his son and granddaughter. The pts granddaughter states that pt is independent at home with ADL's. Does not require walking aides.     Action/Plan:   Pt to return home at discharge. No CM needs noted.   Anticipated DC Date:  05/18/2013   Anticipated DC Plan:  HOME/SELF CARE      DC Planning Services  CM consult      Choice offered to / List presented to:             Status of service:   Medicare Important Message given?   (If response is "NO", the following Medicare IM given date fields will be blank) Date Medicare IM given:   Date Additional Medicare IM given:    Discharge Disposition:  HOME/SELF CARE  Per UR Regulation:    If discussed at Long Length of Stay Meetings, dates discussed:    Comments:  05/18/13 1410 Arlyss Queen, RN BSN CM Pt discharged home today with granddaughter. No CM needs noted.  05/17/13 1522 Arlyss Queen, RN BSN CM

## 2013-05-18 DIAGNOSIS — F411 Generalized anxiety disorder: Secondary | ICD-10-CM

## 2013-05-18 DIAGNOSIS — I517 Cardiomegaly: Secondary | ICD-10-CM

## 2013-05-18 DIAGNOSIS — K219 Gastro-esophageal reflux disease without esophagitis: Secondary | ICD-10-CM

## 2013-05-18 NOTE — ED Provider Notes (Signed)
Medical screening examination/treatment/procedure(s) were conducted as a shared visit with non-physician practitioner(s) and myself.  I personally evaluated the patient during the encounter Pt had syncope.  Lungs clear.  Heart nl.    Benny Lennert, MD 05/18/13 [phone]

## 2013-05-18 NOTE — Progress Notes (Signed)
Patient's family states understanding of discharge instructions 

## 2013-05-18 NOTE — Discharge Summary (Signed)
Physician Discharge Summary  Mike Hamre XBJ:[id number] DOB: Mar 09, 1931 DOA: 05/16/2013  PCP: No primary provider on file.  Admit date: 05/16/2013 Discharge date: 05/18/2013  Time spent: 35 minutes  Recommendations for Outpatient Follow-up:  1. Follow up with primary care doctor in 1-2 weeks  Discharge Diagnoses:  Principal Problem:   Syncope Active Problems:   Near syncope   Dementia   GERD (gastroesophageal reflux disease)   Anxiety   Discharge Condition: improved  Diet recommendation: low salt  Filed Weights   05/16/13 2132 05/17/13 0112 05/18/13 0656  Weight: 68.04 kg (150 lb) 65.7 kg (144 lb 13.5 oz) 66.3 kg (146 lb 2.6 oz)    History of present illness:  Chad Everett is a 79 y.o. male. Elderly Caucasian gentleman with dementia, lives in home with his daughter, who brings him to our emergency room after she heard him in difficulties in the shower. She found them slumped over not passed out but unable to support himself and sweating profusely.  She reports a shower was not particularly hot to than usual; she denies previous similar episode  Episode was apparently associated with chest pain and shortness of breath; no fever cough or cold no nausea, vomiting or diarrhea.  His daughter was present reports that he is now back to its baseline   Hospital Course:  This patient was admitted to the hospital with an episode of possible syncope. EKG was found to be unremarkable. Cardiac enzymes were negative. Orthostatic blood pressures were found to be negative. Orthostatic pulse was equivocal. Patient was initially started on IV fluids which were subsequently discontinued. 2-D echocardiogram has been done with results pending. This will be followed up. The patient feels significantly improved and does not have any complaints at this time. He does not have any dizziness on standing, no chest pain or shortness of breath. Physical therapy has seen the patient and did not recommend any  followup. He'll be discharged home today.  Procedures:  Echo, results pending at time of discharge  Consultations:  none  Discharge Exam: Filed Vitals:   05/18/13 1058  BP: 104/70  Pulse: 80  Temp: 98 F (36.7 C)  Resp: 20    General: NAD, no complaints Cardiovascular: S1, S2, RRR, no m,g,r Respiratory: CTA B  Discharge Instructions     Medication List    ASK your doctor about these medications       albuterol 108 (90 BASE) MCG/ACT inhaler  Commonly known as:  PROVENTIL HFA;VENTOLIN HFA  Inhale 2 puffs into the lungs every 6 (six) hours as needed for wheezing or shortness of breath.     aspirin EC 81 MG tablet  Take 81 mg by mouth every morning.     mirtazapine 15 MG tablet  Commonly known as:  REMERON  Take 15 mg by mouth at bedtime.     omeprazole 20 MG capsule  Commonly known as:  PRILOSEC  Take 20 mg by mouth daily.     risperiDONE 1 MG tablet  Commonly known as:  RISPERDAL  Take 1 mg by mouth at bedtime.       Allergies  Allergen Reactions  . Onion       The results of significant diagnostics from this hospitalization (including imaging, microbiology, ancillary and laboratory) are listed below for reference.    Significant Diagnostic Studies: No results found.  Microbiology: No results found for this or any previous visit (from the past 240 hour(s)).   Labs: Basic Metabolic Panel:  Recent Labs Lab  05/16/13 2203 05/17/13 0447  NA 136 137  K 4.2 3.6  CL 97 100  CO2 29 27  GLUCOSE 134* 97  BUN 16 18  CREATININE 0.84 0.78  CALCIUM 9.7 9.1  MG  --  2.0   Liver Function Tests:  Recent Labs Lab 05/16/13 2203 05/17/13 0447  AST 14 11  ALT 9 8  ALKPHOS 94 81  BILITOT 1.0 0.9  PROT 8.0 7.0  ALBUMIN 3.6 3.3*   No results found for this basename: LIPASE, AMYLASE,  in the last 168 hours No results found for this basename: AMMONIA,  in the last 168 hours CBC:  Recent Labs Lab 05/16/13 2203 05/17/13 0447  WBC 8.5 8.2   NEUTROABS 6.0  --   HGB 15.4 13.9  HCT 44.8 41.3  MCV 92.0 92.2  PLT 287 296   Cardiac Enzymes:  Recent Labs Lab 05/16/13 2203 05/17/13 1232 05/17/13 1821  TROPONINI <0.30 <0.30 <0.30   BNP: BNP (last 3 results) No results found for this basename: PROBNP,  in the last 8760 hours CBG: No results found for this basename: GLUCAP,  in the last 168 hours     Signed:  Mikiyah Glasner  [facility] 05/18/2013, 1:38 PM

## 2013-05-18 NOTE — Progress Notes (Signed)
[facility]   2D echo completed 05/18/2013.   Cindy Kaisei Gilbo, RDCS  

## 2014-08-10 ENCOUNTER — Emergency Department (HOSPITAL_COMMUNITY): Payer: Medicare Other

## 2014-08-10 ENCOUNTER — Inpatient Hospital Stay (HOSPITAL_COMMUNITY)
Admission: EM | Admit: 2014-08-10 | Discharge: 2014-08-16 | DRG: 177 | Disposition: A | Discharge status: Home / Self Care | Payer: Medicare Other | Attending: Internal Medicine | Admitting: Internal Medicine

## 2014-08-10 ENCOUNTER — Encounter (HOSPITAL_COMMUNITY): Payer: Self-pay | Admitting: *Deleted

## 2014-08-10 DIAGNOSIS — F0390 Unspecified dementia without behavioral disturbance: Secondary | ICD-10-CM | POA: Diagnosis present

## 2014-08-10 DIAGNOSIS — G92 Toxic encephalopathy: Secondary | ICD-10-CM | POA: Diagnosis present

## 2014-08-10 DIAGNOSIS — I5032 Chronic diastolic (congestive) heart failure: Secondary | ICD-10-CM | POA: Diagnosis present

## 2014-08-10 DIAGNOSIS — J690 Pneumonitis due to inhalation of food and vomit: Principal | ICD-10-CM | POA: Diagnosis present

## 2014-08-10 DIAGNOSIS — J9601 Acute respiratory failure with hypoxia: Secondary | ICD-10-CM | POA: Diagnosis present

## 2014-08-10 DIAGNOSIS — Z7982 Long term (current) use of aspirin: Secondary | ICD-10-CM

## 2014-08-10 DIAGNOSIS — E872 Acidosis: Secondary | ICD-10-CM | POA: Diagnosis present

## 2014-08-10 DIAGNOSIS — E876 Hypokalemia: Secondary | ICD-10-CM | POA: Diagnosis present

## 2014-08-10 DIAGNOSIS — E860 Dehydration: Secondary | ICD-10-CM | POA: Diagnosis present

## 2014-08-10 DIAGNOSIS — R531 Weakness: Secondary | ICD-10-CM

## 2014-08-10 DIAGNOSIS — R1110 Vomiting, unspecified: Secondary | ICD-10-CM

## 2014-08-10 DIAGNOSIS — F0391 Unspecified dementia with behavioral disturbance: Secondary | ICD-10-CM | POA: Diagnosis present

## 2014-08-10 DIAGNOSIS — J189 Pneumonia, unspecified organism: Secondary | ICD-10-CM

## 2014-08-10 DIAGNOSIS — J449 Chronic obstructive pulmonary disease, unspecified: Secondary | ICD-10-CM | POA: Diagnosis present

## 2014-08-10 DIAGNOSIS — I2510 Atherosclerotic heart disease of native coronary artery without angina pectoris: Secondary | ICD-10-CM | POA: Diagnosis present

## 2014-08-10 DIAGNOSIS — K219 Gastro-esophageal reflux disease without esophagitis: Secondary | ICD-10-CM | POA: Diagnosis present

## 2014-08-10 DIAGNOSIS — R1114 Bilious vomiting: Secondary | ICD-10-CM

## 2014-08-10 DIAGNOSIS — Z823 Family history of stroke: Secondary | ICD-10-CM

## 2014-08-10 LAB — BASIC METABOLIC PANEL
ANION GAP: 8 (ref 5–15)
BUN: 12 mg/dL (ref 6–23)
CHLORIDE: 101 meq/L (ref 96–112)
CO2: 28 mmol/L (ref 19–32)
CREATININE: 0.98 mg/dL (ref 0.50–1.35)
Calcium: 8.7 mg/dL (ref 8.4–10.5)
GFR calc Af Amer: 87 mL/min — ABNORMAL LOW (ref >90)
GFR calc non Af Amer: 75 mL/min — ABNORMAL LOW (ref >90)
GLUCOSE: 155 mg/dL — AB (ref 70–99)
Potassium: 3.7 mmol/L (ref 3.5–5.1)
Sodium: 137 mmol/L (ref 135–145)

## 2014-08-10 LAB — CBC
HCT: 41.3 % (ref 39.0–52.0)
HEMOGLOBIN: 13.7 g/dL (ref 13.0–17.0)
MCH: 31.7 pg (ref 26.0–34.0)
MCHC: 33.2 g/dL (ref 30.0–36.0)
MCV: 95.6 fL (ref 78.0–100.0)
Platelets: 231 10*3/uL (ref 150–400)
RBC: 4.32 MIL/uL (ref 4.22–5.81)
RDW: 14 % (ref 11.5–15.5)
WBC: 9 10*3/uL (ref 4.0–10.5)

## 2014-08-10 LAB — I-STAT CG4 LACTIC ACID, ED: LACTIC ACID, VENOUS: 3.59 mmol/L — AB (ref 0.5–2.2)

## 2014-08-10 MED ORDER — IPRATROPIUM-ALBUTEROL 0.5-2.5 (3) MG/3ML IN SOLN
RESPIRATORY_TRACT | Status: AC
  Administered 2014-08-10: 3 mL
  Filled 2014-08-10: qty 3

## 2014-08-10 MED ORDER — ALBUTEROL SULFATE (2.5 MG/3ML) 0.083% IN NEBU
5.0000 mg | INHALATION_SOLUTION | Freq: Once | RESPIRATORY_TRACT | Status: AC
  Administered 2014-08-10: 2.5 mg via RESPIRATORY_TRACT
  Filled 2014-08-10: qty 6

## 2014-08-10 NOTE — ED Provider Notes (Signed)
CSN: [id number]     Arrival date & time 08/10/14  2225 History  This chart was scribed for Joya Gaskins, MD by Modena Jansky, ED Scribe. This patient was seen in room APA04/APA04 and the patient's care was started at 11:19 PM.   Chief Complaint  Patient presents with  . Shortness of Breath   Patient is a 80 y.o. male presenting with weakness. The history is provided by the patient. No language interpreter was used.  Weakness This is a new problem. The current episode started 3 to 5 hours ago. The problem occurs constantly. The problem has been resolved. Associated symptoms include shortness of breath. Nothing aggravates the symptoms. Nothing relieves the symptoms.   Level 5 Caveat due to dementia HPI Comments: Chad Everett is a 80 y.o. male with a hx of dementia who presents to the Emergency Department complaining of constant moderate weakness that started today. Granddaughter reports that pt has had the sniffles and cough the last couple of days. She states that then today pt stumbled into kitchen during dinner and said "I'm drunk" with slurred speech. She reports that pt was weak and his eyes rolled back. She states that pt had diaphoresis, vomiting, and bowel incontinence. She denies any LOC in pt.  He will have episodes of incontinence at times per granddaughter   Past Medical History  Diagnosis Date  . Dementia   . GERD (gastroesophageal reflux disease)   . Anxiety    History reviewed. No pertinent past surgical history. Family History  Problem Relation Age of Onset  . Stroke Sister    History  Substance Use Topics  . Smoking status: Never Smoker   . Smokeless tobacco: Not on file  . Alcohol Use: No    Review of Systems  Unable to perform ROS: Dementia  Constitutional: Positive for diaphoresis.  Respiratory: Positive for cough and shortness of breath.   Gastrointestinal: Positive for vomiting.  Neurological: Positive for weakness. Negative for syncope.    Allergies   Onion  Home Medications   Prior to Admission medications   Medication Sig Start Date End Date Taking? Authorizing Provider  albuterol (PROVENTIL HFA;VENTOLIN HFA) 108 (90 BASE) MCG/ACT inhaler Inhale 2 puffs into the lungs every 6 (six) hours as needed for wheezing or shortness of breath.    Historical Provider, MD  aspirin EC 81 MG tablet Take 81 mg by mouth every morning.     Historical Provider, MD  mirtazapine (REMERON) 15 MG tablet Take 15 mg by mouth at bedtime.    Historical Provider, MD  omeprazole (PRILOSEC) 20 MG capsule Take 20 mg by mouth daily.    Historical Provider, MD  risperiDONE (RISPERDAL) 1 MG tablet Take 1 mg by mouth at bedtime.    Historical Provider, MD   BP 152/53 mmHg  Pulse 85  Temp(Src) 98 F (36.7 C) (Oral)  Resp 14  SpO2 99% Physical Exam  Nursing note and vitals reviewed. CONSTITUTIONAL: Elderly and frail HEAD: Normocephalic/atraumatic EYES: EOMI/PERRL ENMT: Mucous membranes moist NECK: supple no meningeal signs SPINE/BACK:entire spine nontender CV: S1/S2 noted, no murmurs/rubs/gallops noted LUNGS: Coarse breath sounds bilaterally,  ABDOMEN: soft, nontender, no rebound or guarding, bowel sounds noted throughout abdomen GU:no cva tenderness NEURO: Pt is awake/alert/appropriate, moves all extremitiesx4.  No facial droop. No arm or leg drift, pt is mildly confused at baseline, EXTREMITIES: pulses normal/equal, full ROM SKIN: warm, color normal PSYCH: no abnormalities of mood noted, alert and oriented to situation   ED Course  Procedures  DIAGNOSTIC STUDIES: Oxygen Saturation is 99% on RA, normal by my interpretation.    COORDINATION OF CARE: 11:23 PM- Pt advised of plan for treatment which includes medication, radiology, and labs and pt agrees.   12:58 AM Lactate elevated IV fluids ordered He is in no distress He denies HA/CP/abd pain He has no focal weakness on exam 4:42 AM After IV fluids, lactate increased He walked to bathroom,  felt weak and vomited in toilet (nurse reports it was "bilious") While in bed he denies complaint and has no focal abd tenderness Due to persistent vomiting and elevated lactate will admit D/w dr Alvester [place], will admit but he requests CT imaging of abdomen/pelvis Labs Review Labs Reviewed  BASIC METABOLIC PANEL - Abnormal; Notable for the following:    Glucose, Bld 155 (*)    GFR calc non Af Amer 75 (*)    GFR calc Af Amer 87 (*)    All other components within normal limits  I-STAT CG4 LACTIC ACID, ED - Abnormal; Notable for the following:    Lactic Acid, Venous 3.59 (*)    All other components within normal limits  CBC  TROPONIN I  URINALYSIS, ROUTINE W REFLEX MICROSCOPIC    Imaging Review Dg Chest 2 View (if Patient Has Fever And/or Copd)  08/11/2014   CLINICAL DATA:  Cold symptoms for 2 days. Short of breath. Weakness. Initial encounter.  EXAM: CHEST  2 VIEW  COMPARISON:  04/06/2011.  FINDINGS: Cardiopericardial silhouette within normal limits. Mediastinal contours normal. Trachea midline. No airspace disease or effusion. Monitoring leads project over the chest. Lung volumes are lower than on the prior exam.  IMPRESSION: No active cardiopulmonary disease.   Electronically Signed   By: Andreas NewportGeoffrey  Lamke M.D.   On: 08/11/2014 00:11   Ct Head Wo Contrast  08/11/2014   CLINICAL DATA:  Altered mental status  EXAM: CT HEAD WITHOUT CONTRAST  TECHNIQUE: Contiguous axial images were obtained from the base of the skull through the vertex without intravenous contrast.  COMPARISON:  None.  FINDINGS: Skull and Sinuses:Negative for fracture or destructive process. The mastoids, middle ears, and imaged paranasal sinuses are clear.  Orbits: No acute abnormality.  Brain: No evidence of acute infarction, hemorrhage, hydrocephalus, or mass lesion/mass effect.  There is diffuse brain atrophy with prominent hippocampal volume loss that is suggestive of Alzheimer's disease in this patient with history of dementia.  There is mild small-vessel ischemic change around the lateral ventricles, typical for age.  IMPRESSION: 1. No acute intracranial disease. 2. Brain atrophy, as above.   Electronically Signed   By: Tiburcio PeaJonathan  Watts M.D.   On: 08/11/2014 00:24     Date: 08/11/2014 [id number]  Rate: 96  Rhythm: normal sinus rhythm  QRS Axis: normal  Intervals: normal  ST/T Wave abnormalities: nonspecific ST changes  Conduction Disutrbances:none   Medications  albuterol (PROVENTIL) (2.5 MG/3ML) 0.083% nebulizer solution 5 mg (2.5 mg Nebulization Given 08/10/14 2333)  ipratropium-albuterol (DUONEB) 0.5-2.5 (3) MG/3ML nebulizer solution (3 mLs  Given 08/10/14 2333)  sodium chloride 0.9 % bolus 1,000 mL (0 mLs Intravenous Stopped 08/11/14 0209)  sodium chloride 0.9 % bolus 1,000 mL (0 mLs Intravenous Stopped 08/11/14 0310)      MDM   Final diagnoses:  Weakness  Vomiting  Dehydration  Bilious vomiting with nausea    Nursing notes including past medical history and social history reviewed and considered in documentation xrays/imaging reviewed by myself and considered during evaluation Labs/vital reviewed myself and considered during evaluation   I  personally performed the services described in this documentation, which was scribed in my presence. The recorded information has been reviewed and is accurate.       Joya Gaskinsonald W Doyt Castellana, MD 08/11/14 [phone]

## 2014-08-10 NOTE — ED Notes (Signed)
Pt arrived by EMS from home. Reported pt has had a cold for the past few days & SOB for the past 2 days. Pt was given an albuterol tx by EMS.

## 2014-08-11 ENCOUNTER — Inpatient Hospital Stay (HOSPITAL_COMMUNITY): Payer: Medicare Other

## 2014-08-11 ENCOUNTER — Emergency Department (HOSPITAL_COMMUNITY): Payer: Medicare Other

## 2014-08-11 DIAGNOSIS — Z823 Family history of stroke: Secondary | ICD-10-CM | POA: Diagnosis not present

## 2014-08-11 DIAGNOSIS — F0391 Unspecified dementia with behavioral disturbance: Secondary | ICD-10-CM | POA: Diagnosis present

## 2014-08-11 DIAGNOSIS — E876 Hypokalemia: Secondary | ICD-10-CM | POA: Diagnosis present

## 2014-08-11 DIAGNOSIS — G92 Toxic encephalopathy: Secondary | ICD-10-CM | POA: Diagnosis present

## 2014-08-11 DIAGNOSIS — E860 Dehydration: Secondary | ICD-10-CM | POA: Diagnosis present

## 2014-08-11 DIAGNOSIS — J9610 Chronic respiratory failure, unspecified whether with hypoxia or hypercapnia: Secondary | ICD-10-CM

## 2014-08-11 DIAGNOSIS — I2510 Atherosclerotic heart disease of native coronary artery without angina pectoris: Secondary | ICD-10-CM | POA: Diagnosis present

## 2014-08-11 DIAGNOSIS — Z7982 Long term (current) use of aspirin: Secondary | ICD-10-CM | POA: Diagnosis not present

## 2014-08-11 DIAGNOSIS — E872 Acidosis: Secondary | ICD-10-CM | POA: Diagnosis present

## 2014-08-11 DIAGNOSIS — J449 Chronic obstructive pulmonary disease, unspecified: Secondary | ICD-10-CM | POA: Diagnosis present

## 2014-08-11 DIAGNOSIS — J690 Pneumonitis due to inhalation of food and vomit: Secondary | ICD-10-CM | POA: Diagnosis present

## 2014-08-11 DIAGNOSIS — R531 Weakness: Secondary | ICD-10-CM

## 2014-08-11 DIAGNOSIS — I5032 Chronic diastolic (congestive) heart failure: Secondary | ICD-10-CM | POA: Diagnosis present

## 2014-08-11 DIAGNOSIS — K219 Gastro-esophageal reflux disease without esophagitis: Secondary | ICD-10-CM | POA: Diagnosis present

## 2014-08-11 DIAGNOSIS — J9601 Acute respiratory failure with hypoxia: Secondary | ICD-10-CM | POA: Diagnosis present

## 2014-08-11 LAB — CBC WITH DIFFERENTIAL/PLATELET
BASOS ABS: 0 10*3/uL (ref 0.0–0.1)
Basophils Absolute: 0 10*3/uL (ref 0.0–0.1)
Basophils Relative: 0 % (ref 0–1)
Basophils Relative: 0 % (ref 0–1)
EOS ABS: 0 10*3/uL (ref 0.0–0.7)
EOS PCT: 0 % (ref 0–5)
Eosinophils Absolute: 0 10*3/uL (ref 0.0–0.7)
Eosinophils Relative: 0 % (ref 0–5)
HCT: 34.3 % — ABNORMAL LOW (ref 39.0–52.0)
HEMATOCRIT: 34.7 % — AB (ref 39.0–52.0)
HEMOGLOBIN: 11.8 g/dL — AB (ref 13.0–17.0)
Hemoglobin: 11.5 g/dL — ABNORMAL LOW (ref 13.0–17.0)
LYMPHS ABS: 0.5 10*3/uL — AB (ref 0.7–4.0)
LYMPHS ABS: 0.7 10*3/uL (ref 0.7–4.0)
LYMPHS PCT: 7 % — AB (ref 12–46)
Lymphocytes Relative: 6 % — ABNORMAL LOW (ref 12–46)
MCH: 31.9 pg (ref 26.0–34.0)
MCH: 31.7 pg (ref 26.0–34.0)
MCHC: 34 g/dL (ref 30.0–36.0)
MCHC: 33.5 g/dL (ref 30.0–36.0)
MCV: 93.8 fL (ref 78.0–100.0)
MCV: 94.5 fL (ref 78.0–100.0)
MONO ABS: 0.8 10*3/uL (ref 0.1–1.0)
MONO ABS: 1 10*3/uL (ref 0.1–1.0)
Monocytes Relative: 9 % (ref 3–12)
Monocytes Relative: 9 % (ref 3–12)
NEUTROS ABS: 9 10*3/uL — AB (ref 1.7–7.7)
Neutro Abs: 8.2 10*3/uL — ABNORMAL HIGH (ref 1.7–7.7)
Neutrophils Relative %: 85 % — ABNORMAL HIGH (ref 43–77)
Neutrophils Relative %: 84 % — ABNORMAL HIGH (ref 43–77)
PLATELETS: 191 10*3/uL (ref 150–400)
Platelets: 202 10*3/uL (ref 150–400)
RBC: 3.7 MIL/uL — ABNORMAL LOW (ref 4.22–5.81)
RBC: 3.63 MIL/uL — ABNORMAL LOW (ref 4.22–5.81)
RDW: 13.9 % (ref 11.5–15.5)
RDW: 14.1 % (ref 11.5–15.5)
WBC: 9.6 10*3/uL (ref 4.0–10.5)
WBC: 10.8 10*3/uL — ABNORMAL HIGH (ref 4.0–10.5)

## 2014-08-11 LAB — URINALYSIS, ROUTINE W REFLEX MICROSCOPIC
Bilirubin Urine: NEGATIVE
Glucose, UA: NEGATIVE mg/dL
Glucose, UA: NEGATIVE mg/dL
KETONES UR: NEGATIVE mg/dL
LEUKOCYTES UA: NEGATIVE
LEUKOCYTES UA: NEGATIVE
Nitrite: NEGATIVE
Nitrite: NEGATIVE
Protein, ur: 100 mg/dL — AB
Protein, ur: 30 mg/dL — AB
Specific Gravity, Urine: 1.02 (ref 1.005–1.030)
UROBILINOGEN UA: 1 mg/dL (ref 0.0–1.0)
Urobilinogen, UA: 1 mg/dL (ref 0.0–1.0)
pH: 6 (ref 5.0–8.0)
pH: 6 (ref 5.0–8.0)

## 2014-08-11 LAB — COMPREHENSIVE METABOLIC PANEL
ALT: 9 U/L (ref 0–53)
AST: 16 U/L (ref 0–37)
Albumin: 3.1 g/dL — ABNORMAL LOW (ref 3.5–5.2)
Alkaline Phosphatase: 77 U/L (ref 39–117)
Anion gap: 6 (ref 5–15)
BUN: 10 mg/dL (ref 6–23)
CO2: 24 mmol/L (ref 19–32)
Calcium: 7.6 mg/dL — ABNORMAL LOW (ref 8.4–10.5)
Chloride: 102 mEq/L (ref 96–112)
Creatinine, Ser: 0.71 mg/dL (ref 0.50–1.35)
GFR calc non Af Amer: 86 mL/min — ABNORMAL LOW (ref >90)
GLUCOSE: 146 mg/dL — AB (ref 70–99)
Potassium: 3.5 mmol/L (ref 3.5–5.1)
Sodium: 132 mmol/L — ABNORMAL LOW (ref 135–145)
TOTAL PROTEIN: 5.9 g/dL — AB (ref 6.0–8.3)
Total Bilirubin: 1 mg/dL (ref 0.3–1.2)

## 2014-08-11 LAB — SALICYLATE LEVEL: Salicylate Lvl: <4 mg/dL (ref 2.8–20.0)

## 2014-08-11 LAB — LACTIC ACID, PLASMA
LACTIC ACID, VENOUS: 1.8 mmol/L (ref 0.5–2.2)
Lactic Acid, Venous: 3 mmol/L — ABNORMAL HIGH (ref 0.5–2.2)
Lactic Acid, Venous: 2.1 mmol/L (ref 0.5–2.2)
Lactic Acid, Venous: 2.6 mmol/L — ABNORMAL HIGH (ref 0.5–2.2)
Lactic Acid, Venous: 1.1 mmol/L (ref 0.5–2.2)

## 2014-08-11 LAB — HEPATIC FUNCTION PANEL
ALBUMIN: 3.3 g/dL — AB (ref 3.5–5.2)
ALK PHOS: 82 U/L (ref 39–117)
ALT: 11 U/L (ref 0–53)
AST: 17 U/L (ref 0–37)
BILIRUBIN TOTAL: 0.9 mg/dL (ref 0.3–1.2)
Bilirubin, Direct: 0.2 mg/dL (ref 0.0–0.3)
Indirect Bilirubin: 0.7 mg/dL (ref 0.3–0.9)
Total Protein: 6.4 g/dL (ref 6.0–8.3)

## 2014-08-11 LAB — URINE MICROSCOPIC-ADD ON

## 2014-08-11 LAB — VOLATILES,BLD-ACETONE,ETHANOL,ISOPROP,METHANOL
ACETONE, BLOOD: NOT DETECTED
ETHANOL, BLOOD: NOT DETECTED
ISOPROPANOL, BLOOD: NOT DETECTED
Methanol, blood: NOT DETECTED

## 2014-08-11 LAB — I-STAT CG4 LACTIC ACID, ED: Lactic Acid, Venous: 3.77 mmol/L — ABNORMAL HIGH (ref 0.5–2.2)

## 2014-08-11 LAB — TROPONIN I: Troponin I: <0.03 ng/mL (ref <0.031)

## 2014-08-11 LAB — LIPASE, BLOOD: LIPASE: 24 U/L (ref 11–59)

## 2014-08-11 LAB — PROCALCITONIN

## 2014-08-11 MED ORDER — IOHEXOL 300 MG/ML  SOLN
100.0000 mL | Freq: Once | INTRAMUSCULAR | Status: AC | PRN
  Administered 2014-08-11: 100 mL via INTRAVENOUS

## 2014-08-11 MED ORDER — SODIUM CHLORIDE 0.9 % IJ SOLN
3.0000 mL | Freq: Two times a day (BID) | INTRAMUSCULAR | Status: DC
  Administered 2014-08-11 – 2014-08-13 (×4): 3 mL via INTRAVENOUS

## 2014-08-11 MED ORDER — SODIUM CHLORIDE 0.9 % IV BOLUS (SEPSIS)
1000.0000 mL | Freq: Once | INTRAVENOUS | Status: AC
  Administered 2014-08-11: 1000 mL via INTRAVENOUS

## 2014-08-11 MED ORDER — AZITHROMYCIN 250 MG PO TABS
500.0000 mg | ORAL_TABLET | Freq: Every day | ORAL | Status: DC
  Administered 2014-08-11 – 2014-08-12 (×2): 500 mg via ORAL
  Filled 2014-08-11 (×2): qty 2

## 2014-08-11 MED ORDER — MIRTAZAPINE 30 MG PO TABS
15.0000 mg | ORAL_TABLET | Freq: Every day | ORAL | Status: DC
  Administered 2014-08-11 – 2014-08-14 (×4): 15 mg via ORAL
  Filled 2014-08-11 (×5): qty 1

## 2014-08-11 MED ORDER — AZITHROMYCIN 250 MG PO TABS: 500.0000 mg | ORAL_TABLET | Freq: Every day | ORAL | Status: DC

## 2014-08-11 MED ORDER — ACETAMINOPHEN 500 MG PO TABS
500.0000 mg | ORAL_TABLET | Freq: Four times a day (QID) | ORAL | Status: DC | PRN
  Administered 2014-08-11 – 2014-08-13 (×3): 500 mg via ORAL
  Filled 2014-08-11 (×3): qty 1

## 2014-08-11 MED ORDER — PANTOPRAZOLE SODIUM 40 MG PO TBEC
40.0000 mg | DELAYED_RELEASE_TABLET | Freq: Every day | ORAL | Status: DC
  Administered 2014-08-11 – 2014-08-15 (×5): 40 mg via ORAL
  Filled 2014-08-11 (×5): qty 1

## 2014-08-11 MED ORDER — SODIUM CHLORIDE 0.9 % IV SOLN
INTRAVENOUS | Status: DC
  Administered 2014-08-11 (×2): via INTRAVENOUS

## 2014-08-11 MED ORDER — RISPERIDONE 1 MG PO TABS
1.0000 mg | ORAL_TABLET | Freq: Every day | ORAL | Status: DC
  Administered 2014-08-11 – 2014-08-14 (×4): 1 mg via ORAL
  Filled 2014-08-11 (×6): qty 1

## 2014-08-11 MED ORDER — ALBUTEROL SULFATE (2.5 MG/3ML) 0.083% IN NEBU: 3.0000 mL | INHALATION_SOLUTION | Freq: Four times a day (QID) | RESPIRATORY_TRACT | Status: DC | PRN

## 2014-08-11 MED ORDER — DEXTROSE 5 % IV SOLN
INTRAVENOUS | Status: AC
  Filled 2014-08-11: qty 10

## 2014-08-11 MED ORDER — IOHEXOL 300 MG/ML  SOLN
25.0000 mL | Freq: Once | INTRAMUSCULAR | Status: AC | PRN
  Administered 2014-08-11: 25 mL via ORAL

## 2014-08-11 MED ORDER — ONDANSETRON HCL 4 MG PO TABS: 4.0000 mg | ORAL_TABLET | Freq: Four times a day (QID) | ORAL | Status: DC | PRN

## 2014-08-11 MED ORDER — CEFTRIAXONE SODIUM IN DEXTROSE 20 MG/ML IV SOLN
1.0000 g | INTRAVENOUS | Status: DC
  Administered 2014-08-11: 1 g via INTRAVENOUS
  Filled 2014-08-11 (×3): qty 50

## 2014-08-11 MED ORDER — HEPARIN SODIUM (PORCINE) 5000 UNIT/ML IJ SOLN
5000.0000 [IU] | Freq: Three times a day (TID) | INTRAMUSCULAR | Status: DC
  Administered 2014-08-11 – 2014-08-15 (×14): 5000 [IU] via SUBCUTANEOUS
  Filled 2014-08-11 (×13): qty 1

## 2014-08-11 MED ORDER — ONDANSETRON HCL 4 MG/2ML IJ SOLN: 4.0000 mg | Freq: Four times a day (QID) | INTRAMUSCULAR | Status: DC | PRN

## 2014-08-11 MED ORDER — ALBUTEROL SULFATE HFA 108 (90 BASE) MCG/ACT IN AERS
2.0000 | INHALATION_SPRAY | Freq: Four times a day (QID) | RESPIRATORY_TRACT | Status: DC | PRN
  Filled 2014-08-11: qty 6.7

## 2014-08-11 MED ORDER — ASPIRIN EC 81 MG PO TBEC
81.0000 mg | DELAYED_RELEASE_TABLET | Freq: Every morning | ORAL | Status: DC
  Administered 2014-08-11 – 2014-08-15 (×5): 81 mg via ORAL
  Filled 2014-08-11 (×5): qty 1

## 2014-08-11 NOTE — H&P (Signed)
Hospitalist Admission History and Physical  Patient name: Chad Everett Medical record number: [id number] Date of birth: [birth year] Age: 81 y.o. Gender: male  Primary Care Provider: No primary care provider on file.  Chief Complaint: weakness, lactic acidosis  History of Present Illness:This is a 81 y.o. year old male with significant past medical history of dementia, GERD, anxiety presenting with weakness, lactic acidosis. Level V caveat as pt poor historian in setting of dementia. No family at bedside. Per report, pt w/ cold like sxs over last 2 days. + cough. Unclear about fever. Was acting confused at dinner yesterday with family. Had some ? Slurred speech. Pt denies any CP, SOB. Does report weakness. Minimal to mild abd pain.  On presentation to ER, T 98, HR 70s-100s, resp 10s, BP 120s-160s, Satting 99%. WBC 9, hgb 13.7, Cr 0.98, lactate 28. UA negative for infection. CXR WNL. Head CT WNL. Lactate 3.59-->3.77 s/p IVF. Lipase, LFTs, CT abd and pel pending.    Assessment and Plan: Chad Everett is a 81 y.o. year old male presenting with weakness, lactic acidosis   Active Problems:   Weakness   1- Weakness -unclear etiology currently  -workup fairly benign as of yet apart from elevated lactate -MRI brain w/o contrast r/o CVA -hydrate pt  -f/u LFTs, lipase, CT abd and pelv   2- Lactic acidosis -unclear etiology  -minimal to mild abd pain on exam  -f/u CT abd and pelvis w/ contrast  -check methanol, ethanol, salicylate level  -hydrate  -trend lactate   3- Mood  -stable  -cont home regimen   FEN/GI: npo for now. PPI Prophylaxis: sub q heparin  Disposition: pending further evaluation Code Status: Full Code    Patient Active Problem List   Diagnosis Date Noted  . Weakness 08/11/2014  . Near syncope 05/17/2013  . Syncope 05/17/2013  . Dementia   . GERD (gastroesophageal reflux disease)   . Anxiety    Past Medical History: Past Medical History  Diagnosis Date  .  Dementia   . GERD (gastroesophageal reflux disease)   . Anxiety     Past Surgical History: History reviewed. No pertinent past surgical history.  Social History: History   Social History  . Marital Status: Widowed    Spouse Name: N/A    Number of Children: N/A  . Years of Education: N/A   Social History Main Topics  . Smoking status: Never Smoker   . Smokeless tobacco: None  . Alcohol Use: No  . Drug Use: No  . Sexual Activity: Not Currently   Other Topics Concern  . None   Social History Narrative    Family History: Family History  Problem Relation Age of Onset  . Stroke Sister     Allergies: Allergies  Allergen Reactions  . Onion     Current Facility-Administered Medications  Medication Dose Route Frequency Provider Last Rate Last Dose  . 0.9 %  sodium chloride infusion   Intravenous Continuous Doree Albee, MD      . albuterol (PROVENTIL HFA;VENTOLIN HFA) 108 (90 BASE) MCG/ACT inhaler 2 puff  2 puff Inhalation Q6H PRN Doree Albee, MD      . aspirin EC tablet 81 mg  81 mg Oral q morning - 10a Doree Albee, MD      . heparin injection 5,000 Units  5,000 Units Subcutaneous 3 times per day Doree Albee, MD      . mirtazapine (REMERON) tablet 15 mg  15 mg Oral QHS Doree Albee, MD      .  ondansetron (ZOFRAN) tablet 4 mg  4 mg Oral Q6H PRN Doree Albee, MD       Or  . ondansetron [facility]) injection 4 mg  4 mg Intravenous Q6H PRN Doree Albee, MD      . pantoprazole (PROTONIX) EC tablet 40 mg  40 mg Oral Daily Doree Albee, MD      . risperiDONE (RISPERDAL) tablet 1 mg  1 mg Oral QHS Doree Albee, MD      . sodium chloride 0.9 % injection 3 mL  3 mL Intravenous Q12H Doree Albee, MD       Current Outpatient Prescriptions  Medication Sig Dispense Refill  . albuterol (PROVENTIL HFA;VENTOLIN HFA) 108 (90 BASE) MCG/ACT inhaler Inhale 2 puffs into the lungs every 6 (six) hours as needed for wheezing or shortness of breath.    Marland Kitchen aspirin EC 81 MG tablet Take  81 mg by mouth every morning.     . mirtazapine (REMERON) 15 MG tablet Take 15 mg by mouth at bedtime.    Marland Kitchen omeprazole (PRILOSEC) 20 MG capsule Take 20 mg by mouth daily.    . risperiDONE (RISPERDAL) 1 MG tablet Take 1 mg by mouth at bedtime.     Review Of Systems: 12 point ROS negative except as noted above in HPI.  Physical Exam: Filed Vitals:   08/11/14 0330  BP: 149/62  Pulse: 74  Temp:   Resp:     General: alert and cooperative HEENT: PERRLA and extra ocular movement intact Heart: S1, S2 normal, no murmur, rub or gallop, regular rate and rhythm Lungs: clear to auscultation, no wheezes or rales and unlabored breathing Abdomen: + bowel sounds, minimal to mild generalized abd TTP  Extremities: extremities normal, atraumatic, no cyanosis or edema Skin:no rashes Neurology: normal without focal findings  Labs and Imaging: Lab Results  Component Value Date/Time   NA 137 08/10/2014 10:50 PM   K 3.7 08/10/2014 10:50 PM   CL 101 08/10/2014 10:50 PM   CO2 28 08/10/2014 10:50 PM   BUN 12 08/10/2014 10:50 PM   CREATININE 0.98 08/10/2014 10:50 PM   GLUCOSE 155* 08/10/2014 10:50 PM   Lab Results  Component Value Date   WBC 9.0 08/10/2014   HGB 13.7 08/10/2014   HCT 41.3 08/10/2014   MCV 95.6 08/10/2014   PLT 231 08/10/2014   Urinalysis    Component Value Date/Time   COLORURINE AMBER* 08/11/2014 0025   APPEARANCEUR CLEAR 08/11/2014 0025   LABSPEC >1.030* 08/11/2014 0025   PHURINE 6.0 08/11/2014 0025   GLUCOSEU NEGATIVE 08/11/2014 0025   HGBUR TRACE* 08/11/2014 0025   BILIRUBINUR SMALL* 08/11/2014 0025   KETONESUR TRACE* 08/11/2014 0025   PROTEINUR 100* 08/11/2014 0025   UROBILINOGEN 1.0 08/11/2014 0025   NITRITE NEGATIVE 08/11/2014 0025   LEUKOCYTESUR NEGATIVE 08/11/2014 0025       Dg Chest 2 View (if Patient Has Fever And/or Copd)  08/11/2014   CLINICAL DATA:  Cold symptoms for 2 days. Short of breath. Weakness. Initial encounter.  EXAM: CHEST  2 VIEW  COMPARISON:   04/06/2011.  FINDINGS: Cardiopericardial silhouette within normal limits. Mediastinal contours normal. Trachea midline. No airspace disease or effusion. Monitoring leads project over the chest. Lung volumes are lower than on the prior exam.  IMPRESSION: No active cardiopulmonary disease.   Electronically Signed   By: Andreas Newport M.D.   On: 08/11/2014 00:11   Ct Head Wo Contrast  08/11/2014   CLINICAL DATA:  Altered mental status  EXAM: CT HEAD WITHOUT  CONTRAST  TECHNIQUE: Contiguous axial images were obtained from the base of the skull through the vertex without intravenous contrast.  COMPARISON:  None.  FINDINGS: Skull and Sinuses:Negative for fracture or destructive process. The mastoids, middle ears, and imaged paranasal sinuses are clear.  Orbits: No acute abnormality.  Brain: No evidence of acute infarction, hemorrhage, hydrocephalus, or mass lesion/mass effect.  There is diffuse brain atrophy with prominent hippocampal volume loss that is suggestive of Alzheimer's disease in this patient with history of dementia. There is mild small-vessel ischemic change around the lateral ventricles, typical for age.  IMPRESSION: 1. No acute intracranial disease. 2. Brain atrophy, as above.   Electronically Signed   By: Tiburcio Pea M.D.   On: 08/11/2014 00:24           Doree Albee MD  Pager: [phone]

## 2014-08-11 NOTE — Progress Notes (Signed)
ANTIBIOTIC CONSULT NOTE - INITIAL  Pharmacy Consult for Rocephin Indication: UTI  Allergies  Allergen Reactions  . Onion    Patient Measurements: Weight: 144 lb 14.4 oz (65.726 kg)  Vital Signs: Temp: 98 F (36.7 C) (01/01 1356) Temp Source: Oral (01/01 1356) BP: 112/48 mmHg (01/01 1356) Pulse Rate: 68 (01/01 1617) Intake/Output from previous day: 12/31 0701 - 01/01 0700 In: -  Out: 175 [Urine:175] Intake/Output from this shift:    Labs:  Recent Labs  08/10/14 2250 08/11/14 0533 08/11/14 0850  WBC 9.0 9.6 10.8*  HGB 13.7 11.8* 11.5*  PLT 231 191 202  CREATININE 0.98 0.71  --    CrCl cannot be calculated (Unknown ideal weight.). No results for input(s): VANCOTROUGH, VANCOPEAK, VANCORANDOM, GENTTROUGH, GENTPEAK, GENTRANDOM, TOBRATROUGH, TOBRAPEAK, TOBRARND, AMIKACINPEAK, AMIKACINTROU, AMIKACIN in the last 72 hours.   Microbiology: Recent Results (from the past 720 hour(s))  Culture, blood (routine x 2)     Status: None (Preliminary result)   Collection Time: 08/11/14  6:48 AM  Result Value Ref Range Status   Specimen Description ARM LEFT  Final   Special Requests BOTTLES DRAWN AEROBIC AND ANAEROBIC 8CC EACH  Final   Culture PENDING  Incomplete   Report Status PENDING  Incomplete  Culture, blood (routine x 2)     Status: None (Preliminary result)   Collection Time: 08/11/14  6:52 AM  Result Value Ref Range Status   Specimen Description HAND LEFT  Final   Special Requests BOTTLES DRAWN AEROBIC AND ANAEROBIC [facility] EACH  Final   Culture PENDING  Incomplete   Report Status PENDING  Incomplete    Medical History: Past Medical History  Diagnosis Date  . Dementia   . GERD (gastroesophageal reflux disease)   . Anxiety    Anti-infectives    Start     Dose/Rate Route Frequency Ordered Stop   08/11/14 1900  azithromycin (ZITHROMAX) tablet 500 mg  Status:  Discontinued     500 mg Oral Daily 08/11/14 1858 08/11/14 1859   08/11/14 1900  azithromycin (ZITHROMAX)  tablet 500 mg     500 mg Oral Daily 08/11/14 1859 08/16/14 0959   08/11/14 1615  cefTRIAXone (ROCEPHIN) 1 g in dextrose 5 % 50 mL IVPB - Premix     1 g100 mL/hr over 30 Minutes Intravenous Every 24 hours 08/11/14 1611       Assessment: 81yo male admitted with fever and suspected UTI.  Asked to initiate Rocephin.  Goal of Therapy:  Eradicate infection.  Plan:  Rocephin 1gm IV q24hrs Monitor labs, progress, and cultures  Valrie Hart A 08/11/2014,7:16 PM

## 2014-08-11 NOTE — ED Notes (Signed)
Family contact information:    Edson Snowball- Granddaughter [phone] Orley Lawry- Son [phone]

## 2014-08-11 NOTE — Care Management Note (Unsigned)
    Page 1 of 1   08/15/2014     11:55:45 AM CARE MANAGEMENT NOTE 08/15/2014  Patient:  Chad Everett, Chad Everett   Account Number:  [id number]  Date Initiated:  08/11/2014  Documentation initiated by:  Anibal Henderson  Subjective/Objective Assessment:   Admitted with sepsis, dementia. pt is from home with family and their plan is to return home at present     Action/Plan:   will follow for needs   Anticipated DC Date:  08/14/2014   Anticipated DC Plan:  HOME/SELF CARE      DC Planning Services  CM consult      Choice offered to / List presented to:             Status of service:  Completed, signed off Medicare Important Message given?  YES (If response is "NO", the following Medicare IM given date fields will be blank) Date Medicare IM given:  08/11/2014 Medicare IM given by:  Anibal Henderson Date Additional Medicare IM given:  08/15/2014 Additional Medicare IM given by:  JESSICA CHILDRESS  Discharge Disposition:  HOME/SELF CARE  Per UR Regulation:    If discussed at Long Length of Stay Meetings, dates discussed:   08/15/2014    Comments:  08/15/2013 1130 Kathyrn Sheriff, RN, MSN, PCCN Pt plans to discharge home with family today. Pt has no CM needs at this time. 08/11/14 1200 Anibal Henderson RN/CM

## 2014-08-11 NOTE — Significant Event (Signed)
CXR=? PNA Start zithro in addition.

## 2014-08-11 NOTE — Progress Notes (Signed)
1:08 PM I agree with HPI/GPe and A/P per Dr. Alvester Morin         81 y/o m prior h/o syncope, gerd, false-positive stress test anxiety 07/02/2004 with negative cardiac catheterization LVH, potential COPD, weakness , moderate to severe dementia admitted overnight 08/11/14 with weakness, lactic acidosis with unclear etiology and found ultimately to have a temperature of 101.6, urinalysis showed specific gravity 1.030 trace hemoglobin greater than 100 protein but no leukocyte esterase or nitrates., 2 view chest x-ray showed no active process, CT of the head showed no acute intracranial abnormality, CT abdomen pelvis showed no acute abnormality Repeat CBC trended up for in terms of WBC going from 9.6-10.8 with a fever next line patient was cultured for urine source.  HEENT PLEAS[employer]ADY DISORIENTED. THINKS THAT HE IS AT A [employer]STRUCTION PLACE TO WORK  CHEST Clinically clear no added sound  CARDIAC S1-S2 no murmur rub or gallop  ABDOMEN Soft nontender nondistended no rebound no guarding  NEURO Confused  SKIN/MUSCULAR Range of motion is regular    Patient Active Problem List   Diagnosis Date Noted  . Weakness 08/11/2014  . Near syncope 05/17/2013  . Syncope 05/17/2013  . Dementia   . GERD (gastroesophageal reflux disease)   . Anxiety     MRIs unavailable at [facility] today therefore because patient is nonfocal and has no neurological findings or neck stiffness, I will discontinue this order I believe the etiology is infectious in ? either bronchitis, aspiration or urinary tract infection. I will start him on Rocephin pending urine culture , monitor closely and repeat chest x-ray today. He will need to be monitored relatively closely although his lactic acid is now trending downward from 3-2 range  I have added a Pro calcitonin to his labs to see if this is a bacterial infection I will update family when available   Rest as per history of present illness   Pleas Koch, MD [facility] [phone]

## 2014-08-11 NOTE — ED Notes (Signed)
Patient ambulated to restroom with 1 stand by assist. Patient had a shakey gait while ambulating and became nauseated and had one episode of emesis. Patient states "i am cold" when asked why he was shaking. Patient placed back in bed, family at bedside, EDP notified of patients progress.

## 2014-08-12 ENCOUNTER — Inpatient Hospital Stay (HOSPITAL_COMMUNITY): Payer: Medicare Other

## 2014-08-12 DIAGNOSIS — K219 Gastro-esophageal reflux disease without esophagitis: Secondary | ICD-10-CM

## 2014-08-12 DIAGNOSIS — G92 Toxic encephalopathy: Secondary | ICD-10-CM

## 2014-08-12 LAB — COMPREHENSIVE METABOLIC PANEL
ALK PHOS: 61 U/L (ref 39–117)
ALT: 10 U/L (ref 0–53)
AST: 12 U/L (ref 0–37)
Albumin: 2.7 g/dL — ABNORMAL LOW (ref 3.5–5.2)
Anion gap: 3 — ABNORMAL LOW (ref 5–15)
BILIRUBIN TOTAL: 1.1 mg/dL (ref 0.3–1.2)
BUN: 15 mg/dL (ref 6–23)
CO2: 25 mmol/L (ref 19–32)
Calcium: 7.7 mg/dL — ABNORMAL LOW (ref 8.4–10.5)
Chloride: 106 mEq/L (ref 96–112)
Creatinine, Ser: 0.92 mg/dL (ref 0.50–1.35)
GFR calc Af Amer: 89 mL/min — ABNORMAL LOW (ref >90)
GFR, EST NON AFRICAN AMERICAN: 77 mL/min — AB (ref >90)
Glucose, Bld: 97 mg/dL (ref 70–99)
POTASSIUM: 3.1 mmol/L — AB (ref 3.5–5.1)
Sodium: 134 mmol/L — ABNORMAL LOW (ref 135–145)
Total Protein: 5.5 g/dL — ABNORMAL LOW (ref 6.0–8.3)

## 2014-08-12 LAB — CBC WITH DIFFERENTIAL/PLATELET
BASOS ABS: 0 10*3/uL (ref 0.0–0.1)
Basophils Relative: 0 % (ref 0–1)
EOS ABS: 0.1 10*3/uL (ref 0.0–0.7)
Eosinophils Relative: 1 % (ref 0–5)
HCT: 32.6 % — ABNORMAL LOW (ref 39.0–52.0)
HEMOGLOBIN: 10.9 g/dL — AB (ref 13.0–17.0)
Lymphocytes Relative: 12 % (ref 12–46)
Lymphs Abs: 1.4 10*3/uL (ref 0.7–4.0)
MCH: 31.8 pg (ref 26.0–34.0)
MCHC: 33.4 g/dL (ref 30.0–36.0)
MCV: 95 fL (ref 78.0–100.0)
MONO ABS: 1.4 10*3/uL — AB (ref 0.1–1.0)
Monocytes Relative: 12 % (ref 3–12)
NEUTROS ABS: 8.4 10*3/uL — AB (ref 1.7–7.7)
Neutrophils Relative %: 75 % (ref 43–77)
PLATELETS: 178 10*3/uL (ref 150–400)
RBC: 3.43 MIL/uL — ABNORMAL LOW (ref 4.22–5.81)
RDW: 14.3 % (ref 11.5–15.5)
WBC: 11.3 10*3/uL — ABNORMAL HIGH (ref 4.0–10.5)

## 2014-08-12 LAB — LACTIC ACID, PLASMA
LACTIC ACID, VENOUS: 0.7 mmol/L (ref 0.5–2.2)
Lactic Acid, Venous: 0.8 mmol/L (ref 0.5–2.2)
Lactic Acid, Venous: 0.7 mmol/L (ref 0.5–2.2)
Lactic Acid, Venous: 0.7 mmol/L (ref 0.5–2.2)

## 2014-08-12 MED ORDER — POTASSIUM CHLORIDE CRYS ER 20 MEQ PO TBCR
40.0000 meq | EXTENDED_RELEASE_TABLET | Freq: Every day | ORAL | Status: DC
  Administered 2014-08-12 – 2014-08-15 (×4): 40 meq via ORAL
  Filled 2014-08-12 (×4): qty 2

## 2014-08-12 MED ORDER — CLINDAMYCIN HCL 150 MG PO CAPS
300.0000 mg | ORAL_CAPSULE | Freq: Three times a day (TID) | ORAL | Status: DC
  Administered 2014-08-12 – 2014-08-13 (×4): 300 mg via ORAL
  Filled 2014-08-12 (×4): qty 2

## 2014-08-12 NOTE — Progress Notes (Signed)
Chad Everett RUE:[id number] DOB: 21-Sep-1930 DOA: 08/10/2014 PCP: No primary care provider on file.  Brief narrative: 81 y/o m prior h/o syncope, gerd, false-positive stress test anxiety 07/02/2004 with negative cardiac catheterization LVH, potential COPD, weakness , moderate to severe dementia admitted overnight 08/11/14 with weakness, lactic acidosis with unclear etiology and found ultimately to have a temperature of 101.6, urinalysis showed specific gravity 1.030 trace hemoglobin greater than 100 protein but no leukocyte esterase or nitrates., 2 view chest x-ray showed no active process, CT of the head showed no acute intracranial abnormality, CT abdomen pelvis showed no acute abnormality Patient ultimately was found to have a potential pneumonia and was started on ceftriaxone and azithromycin. He remained confused   Past medical history-As per Problem list  Chart reviewed as below- Reviewed  Consultants:  None  Procedures:  None  Antibiotics:  Ceftriaxone 08/11/14  Azithromycin 08/11/14   Subjective  Confused Does not know where he is Remembers names of his children however Does not know the date or the year No spontaneous other complaints and his review of systems is unreliable given his level of confusion and potential dementia   Objective    Interim History:   Telemetry: pvc's c NSR   Objective: Filed Vitals:   08/11/14 1356 08/11/14 1617 08/11/14 2120 08/12/14 0631  BP: 112/48  120/46 130/42  Pulse: 68 68 72 91  Temp: 98 F (36.7 C)  99.8 F (37.7 C) 99.9 F (37.7 C)  TempSrc: Oral  Oral Oral  Resp: Height:      Weight:    67 kg (147 lb 11.3 oz)  SpO2: 95% 95% 91% 90%    Intake/Output Summary (Last 24 hours) at 08/12/14 1414 Last data filed at 08/12/14 1000  Gross per 24 hour  Intake 2068.33 ml  Output    650 ml  Net 1418.33 ml    Exam:  General: eomi, ncat Cardiovascular: s1 s2 no m/r/g Respiratory: crackles diffusely in the  lower posterior lung fields however no rales or fremitus Abdomen: Soft nontender nondistended no rebound no guarding Skin no lower extremity edema Neuro intact  Data Reviewed: Basic Metabolic Panel:  Recent Labs Lab 08/10/14 2250 08/11/14 0533 08/12/14 0525  NA 137 132* 134*  K 3.7 3.5 3.1*  CL 101 102 106  CO2 GLUCOSE 155* 146* 97  BUN CREATININE 0.98 0.71 0.92  CALCIUM 8.7 7.6* 7.7*   Liver Function Tests:  Recent Labs Lab 08/11/14 0419 08/11/14 0533 08/12/14 0525  AST ALT ALKPHOS 82 77 61  BILITOT 0.9 1.0 1.1  PROT 6.4 5.9* 5.5*  ALBUMIN 3.3* 3.1* 2.7*    Recent Labs Lab 08/11/14 0419  LIPASE 24   No results for input(s): AMMONIA in the last 168 hours. CBC:  Recent Labs Lab 08/10/14 2250 08/11/14 0533 08/11/14 0850 08/12/14 0525  WBC 9.0 9.6 10.8* 11.3*  NEUTROABS  --  8.2* 9.0* 8.4*  HGB 13.7 11.8* 11.5* 10.9*  HCT 41.3 34.7* 34.3* 32.6*  MCV 95.6 93.8 94.5 95.0  PLT 231 191 202 178   Cardiac Enzymes:  Recent Labs Lab 08/10/14 2245 08/11/14 0419  TROPONINI <0.03 <0.03   BNP: Invalid input(s): POCBNP CBG: No results for input(s): GLUCAP in the last 168 hours.  Recent Results (from the past 240 hour(s))  Culture, blood (routine x 2)     Status: None (Preliminary result)   Collection  Time: 08/11/14  6:48 AM  Result Value Ref Range Status   Specimen Description ARM LEFT  Final   Special Requests BOTTLES DRAWN AEROBIC AND ANAEROBIC 8CC EACH  Final   Culture NO GROWTH 1 DAY  Final   Report Status PENDING  Incomplete  Culture, blood (routine x 2)     Status: None (Preliminary result)   Collection Time: 08/11/14  6:52 AM  Result Value Ref Range Status   Specimen Description HAND LEFT  Final   Special Requests BOTTLES DRAWN AEROBIC AND ANAEROBIC 6CC EACH  Final   Culture NO GROWTH 1 DAY  Final   Report Status PENDING  Incomplete     Studies:              All Imaging reviewed and is as per above  notation   Scheduled Meds: . aspirin EC  81 mg Oral q morning - 10a  . azithromycin  500 mg Oral Daily  . cefTRIAXone (ROCEPHIN)  IV  1 g Intravenous Q24H  . heparin  5,000 Units Subcutaneous 3 times per day  . mirtazapine  15 mg Oral QHS  . pantoprazole  40 mg Oral Daily  . risperiDONE  1 mg Oral QHS  . sodium chloride  3 mL Intravenous Q12H   Continuous Infusions: . sodium chloride 50 mL/hr at 08/11/14 2053     Assessment/Plan: 1. Potential aspiration pneumonia-transitioned ceftriaxone and azithromycin to oral clindamycin 300 mg every 8 hourly. We will ask speech therapy to see the patient in consult. Saline lock IV. Lactic acid trending downward, Pro calcitonin negligible. We will change patient's diet in the interim to a chopped dysphagia 2 diet-I suspect the patient will need some amount of support at home with home health and may benefit from skilled nursing facility placement  2. Toxic metabolic encephalopathy-likely infectious as above 3. ? Pyelonephritis-UC from 08/11/13 pending 4. Diastolic CHF, compensated d/c saline-Rpt CXR 2 view in am 5. Hypokalemia-replace orally kdur 40 daily 6. Dementia with behavioral disturbances-moderate to severe -corresponding with an MMSE of at least 20 to 23 , continue Remeron 15 daily at bedtime, risperidone 1 mg daily at bedtime 7. Nonobstructive CAD-monitor, continue aspirin 81 mg 8. COPD-no wheeze 9. GERD-pantoprazole 40 daily  Code Status: full Family Communication:  [place], Bou [phone]     [place], [place] [phone]   Called both #'s -no asnwer Disposition Plan: likely will need SNF   Pleas Koch, MD  [facility] Pager [phone] 08/12/2014, 2:14 PM    LOS: 2 days

## 2014-08-13 DIAGNOSIS — J690 Pneumonitis due to inhalation of food and vomit: Principal | ICD-10-CM

## 2014-08-13 DIAGNOSIS — F0390 Unspecified dementia without behavioral disturbance: Secondary | ICD-10-CM

## 2014-08-13 DIAGNOSIS — J449 Chronic obstructive pulmonary disease, unspecified: Secondary | ICD-10-CM | POA: Diagnosis present

## 2014-08-13 DIAGNOSIS — E876 Hypokalemia: Secondary | ICD-10-CM

## 2014-08-13 DIAGNOSIS — I5032 Chronic diastolic (congestive) heart failure: Secondary | ICD-10-CM

## 2014-08-13 DIAGNOSIS — J9601 Acute respiratory failure with hypoxia: Secondary | ICD-10-CM

## 2014-08-13 LAB — COMPREHENSIVE METABOLIC PANEL
ALBUMIN: 2.8 g/dL — AB (ref 3.5–5.2)
ALK PHOS: 59 U/L (ref 39–117)
ALT: 9 U/L (ref 0–53)
AST: 11 U/L (ref 0–37)
Anion gap: 5 (ref 5–15)
BILIRUBIN TOTAL: 1.2 mg/dL (ref 0.3–1.2)
BUN: 12 mg/dL (ref 6–23)
CO2: 27 mmol/L (ref 19–32)
Calcium: 8.1 mg/dL — ABNORMAL LOW (ref 8.4–10.5)
Chloride: 108 mEq/L (ref 96–112)
Creatinine, Ser: 0.83 mg/dL (ref 0.50–1.35)
GFR, EST NON AFRICAN AMERICAN: 80 mL/min — AB (ref >90)
GLUCOSE: 103 mg/dL — AB (ref 70–99)
POTASSIUM: 3.3 mmol/L — AB (ref 3.5–5.1)
Sodium: 140 mmol/L (ref 135–145)
Total Protein: 6.1 g/dL (ref 6.0–8.3)

## 2014-08-13 LAB — URINE CULTURE
Colony Count: NO GROWTH
Culture: NO GROWTH

## 2014-08-13 LAB — CBC WITH DIFFERENTIAL/PLATELET
BASOS ABS: 0 10*3/uL (ref 0.0–0.1)
Basophils Relative: 0 % (ref 0–1)
EOS ABS: 0.1 10*3/uL (ref 0.0–0.7)
Eosinophils Relative: 1 % (ref 0–5)
HCT: 35.3 % — ABNORMAL LOW (ref 39.0–52.0)
Hemoglobin: 11.6 g/dL — ABNORMAL LOW (ref 13.0–17.0)
LYMPHS ABS: 1.6 10*3/uL (ref 0.7–4.0)
Lymphocytes Relative: 16 % (ref 12–46)
MCH: 31.7 pg (ref 26.0–34.0)
MCHC: 32.9 g/dL (ref 30.0–36.0)
MCV: 96.4 fL (ref 78.0–100.0)
Monocytes Absolute: 1.1 10*3/uL — ABNORMAL HIGH (ref 0.1–1.0)
Monocytes Relative: 12 % (ref 3–12)
NEUTROS ABS: 7 10*3/uL (ref 1.7–7.7)
NEUTROS PCT: 71 % (ref 43–77)
Platelets: 189 10*3/uL (ref 150–400)
RBC: 3.66 MIL/uL — AB (ref 4.22–5.81)
RDW: 14.8 % (ref 11.5–15.5)
WBC: 10 10*3/uL (ref 4.0–10.5)

## 2014-08-13 MED ORDER — PIPERACILLIN-TAZOBACTAM 3.375 G IVPB
3.3750 g | Freq: Three times a day (TID) | INTRAVENOUS | Status: DC
  Administered 2014-08-13 – 2014-08-15 (×6): 3.375 g via INTRAVENOUS
  Filled 2014-08-13 (×9): qty 50

## 2014-08-13 MED ORDER — POTASSIUM CHLORIDE CRYS ER 20 MEQ PO TBCR
40.0000 meq | EXTENDED_RELEASE_TABLET | Freq: Once | ORAL | Status: AC
  Administered 2014-08-13: 40 meq via ORAL
  Filled 2014-08-13: qty 2

## 2014-08-13 MED ORDER — PIPERACILLIN-TAZOBACTAM 3.375 G IVPB
INTRAVENOUS | Status: AC
  Filled 2014-08-13: qty 100

## 2014-08-13 NOTE — Evaluation (Signed)
Clinical/Bedside Swallow Evaluation Patient Details  Name: Chad Everett MRN: [id number] DOB: 02/16/1931  Today's Date: 08/13/2014 Time: 10:45 AM  - 11:04 AM    Past Medical History:  Past Medical History  Diagnosis Date  . Dementia   . GERD (gastroesophageal reflux disease)   . Anxiety    Past Surgical History: History reviewed. No pertinent past surgical history. HPI:  [name] is an 81 y/o m prior h/o syncope, gerd, false-positive stress test anxiety 07/02/2004 with negative cardiac catheterization LVH, potential COPD, weakness , moderate to severe dementia admitted overnight 08/11/14 with weakness, lactic acidosis with unclear etiology and found ultimately to have a temperature of 101.6, urinalysis showed specific gravity 1.030 trace hemoglobin greater than 100 protein but no leukocyte esterase or nitrates., 2 view chest x-ray showed no active process, CT of the head showed no acute intracranial abnormality, CT abdomen pelvis showed no acute abnormality. Patient ultimately was found to have a potential pneumonia and was started on ceftriaxone and azithromycin. MD notes "Potential aspiration pneumonia-transitioned ceftriaxone and azithromycin to oral clindamycin 300 mg every 8 hourly. We will ask speech therapy to see the patient in consult. Saline lock IV. Lactic acid trending downward, Pro calcitonin negligible. We will change patient's diet in the interim to a chopped dysphagia 2 diet-I suspect the patient will need some amount of support at home with home health and may benefit from skilled nursing facility placement". SLP asked to evaluate swallow function.   Assessment/Recommendations/Treatment Plan   SLP Assessment Clinical Impression Statement: [name] denies difficulty swallowing, agreeable to clinical swallow evaluation. Oral motor examination is unremarkable except for upper dentures (edentulous lower), strong but congested volitional cough. Pt tolerated thin water via straw  sips and puree without incident. When taking potassium pill with water, the pill was noted to pocket in right buccal cavity and pt unable to clear with liquid wash. SLP manipulated pill slightly and presented puree and pt able to swallow pill. Pt with one episode coughing when taking pills with water. Mild lingual residuals with solid trials, improved with mech soft. Recommend continuing diet as ordered (D2/thin) with SLP follow up for diet tolerance, upgrades, and determine whether MBSS indicated (does not appear so at this time). Above to RN. Place larger pills in puree and follow with liquid wash. Risk for Aspiration: Mild Other Related Risk Factors: History of pneumonia, Decreased respiratory status, Cognitive impairment  Swallow Evaluation Recommendations Diet Recommendations: Dysphagia 2 (Fine chop), Thin liquid Liquid Administration via: Cup, Straw Medication Administration: Whole meds with puree (smaller pills ok with water) Supervision: Full supervision/cueing for compensatory strategies Compensations: Slow rate, Small sips/bites, Multiple dry swallows after each bite/sip Postural Changes and/or Swallow Maneuvers: Seated upright 90 degrees, Upright 30-60 min after meal Oral Care Recommendations: Oral care BID, Staff/trained caregiver to provide oral care Other Recommendations: Clarify dietary restrictions Follow up Recommendations: 24 hour supervision/assistance  Treatment Plan Treatment Plan Recommendations: Therapy as outlined in treatment plan below Speech Therapy Frequency (ACUTE ONLY): min 2x/week Treatment Duration: 2 weeks Interventions: Aspiration precaution training, Patient/family education, Trials of upgraded texture/liquids, Diet toleration management by SLP, Compensatory techniques  Prognosis Prognosis for Safe Diet Advancement: Good Barriers to Reach Goals: Cognitive deficits  Individuals Consulted Consulted and Agree with Results and Recommendations: Patient, Patient  unable/family or caregiver not available, RN  Swallowing Goals    Pt will demonstrate safe and efficient consumption of least restrictive diet with use of strategies as needed.   Swallow Study Prior Functional Status  Lives with family at home, history of dementia  General  Date of Onset: 08/10/14 HPI: [name] is an 81 y/o m prior h/o syncope, gerd, false-positive stress test anxiety 07/02/2004 with negative cardiac catheterization LVH, potential COPD, weakness , moderate to severe dementia admitted overnight 08/11/14 with weakness, lactic acidosis with unclear etiology and found ultimately to have a temperature of 101.6, urinalysis showed specific gravity 1.030 trace hemoglobin greater than 100 protein but no leukocyte esterase or nitrates., 2 view chest x-ray showed no active process, CT of the head showed no acute intracranial abnormality, CT abdomen pelvis showed no acute abnormality. Patient ultimately was found to have a potential pneumonia and was started on ceftriaxone and azithromycin. MD notes "Potential aspiration pneumonia-transitioned ceftriaxone and azithromycin to oral clindamycin 300 mg every 8 hourly. We will ask speech therapy to see the patient in consult. Saline lock IV. Lactic acid trending downward, Pro calcitonin negligible. We will change patient's diet in the interim to a chopped dysphagia 2 diet-I suspect the patient will need some amount of support at home with home health and may benefit from skilled nursing facility placement". SLP asked to evaluate swallow function. Type of Study: Bedside swallow evaluation Previous Swallow Assessment: None on record Diet Prior to this Study: Dysphagia 2 (chopped), Thin liquids Temperature Spikes Noted: No Respiratory Status: Nasal cannula History of Recent Intubation: No Behavior/Cognition: Alert, Cooperative, Pleasant mood, Hard of hearing Oral Cavity - Dentition: Dentures, top Self-Feeding Abilities: Able to feed  self Patient Positioning: Upright in bed Baseline Vocal Quality: Clear Volitional Cough: Strong, Congested Volitional Swallow: Able to elicit  Oral Motor/Sensory Function  Overall Oral Motor/Sensory Function: Appears within functional limits for tasks assessed  Consistency Results  Ice Chips Ice chips: Not tested  Thin Liquid Thin Liquid: Within functional limits Presentation: Self Fed;Straw  Nectar Thick Liquid Nectar Thick Liquid: Not tested  Honey Thick Liquid Honey Thick Liquid: Not tested  Puree Puree: Within functional limits Presentation: Spoon  Solid Solid: Impaired Presentation: Self Fed Oral Phase Impairments: Impaired mastication Oral Phase Functional Implications: Oral residue  Thank you,  Chad Everett, CCC-SLP [phone]   Chad Everett 08/13/2014,11:14 AM

## 2014-08-13 NOTE — Progress Notes (Signed)
Utilization review Completed Shaiann Mcmanamon RN BSN   

## 2014-08-13 NOTE — Progress Notes (Signed)
[facility] PROGRESS NOTE  Chad Everett ONG:[id number] DOB: 08-02-1931 DOA: 08/10/2014 PCP: No primary care provider on file.  Assessment/Plan: 1. Aspiration pneumonia. Initially started on Rocephin and azithromycin. Patient continues to have fevers. Will change antibiotic therapy to Zosyn for better anaerobic coverage. Seen by speech therapy recommendations were for dysphagia 2 diet. Continue current treatments. 2. Acute encephalopathy superimposed on underlying dementia. Appears to be approaching his baseline. 3. COPD. No evidence of wheezing. 4. Acute respiratory failure with hypoxia. Related to #1. Wean off oxygen as tolerated. 5. Chronic diastolic congestive heart failure. Appears compensated at this point. 6. Hypokalemia. Replace  Code Status: full code Family Communication: no family present Disposition Plan: may need SNF   Consultants:    Procedures:    Antibiotics:  Ceftriaxone 08/11/14>>1/2  Azithromycin 08/11/14>>1/2  Zosyn 1/3  HPI/Subjective: Confused. Denies any complaints  Objective: Filed Vitals:   08/13/14 1858  BP:   Pulse:   Temp: 98.9 F (37.2 C)  Resp:     Intake/Output Summary (Last 24 hours) at 08/13/14 1943 Last data filed at 08/13/14 1846  Gross per 24 hour  Intake    480 ml  Output   1675 ml  Net  -1195 ml   Filed Weights   08/11/14 0700 08/12/14 0631 08/13/14 0528  Weight: 65.726 kg (144 lb 14.4 oz) 67 kg (147 lb 11.3 oz) 64.9 kg (143 lb 1.3 oz)    Exam:   General:  NAD  Cardiovascular: s1, s2, rrr  Respiratory: crackles at bases  Abdomen: soft, nt, nd, bs+  Musculoskeletal: no edema b/l   Data Reviewed: Basic Metabolic Panel:  Recent Labs Lab 08/10/14 2250 08/11/14 0533 08/12/14 0525 08/13/14 0543  NA 137 132* 134* 140  K 3.7 3.5 3.1* 3.3*  CL 101 102 106 108  CO2 GLUCOSE 155* 146* 97 103*  BUN CREATININE 0.98 0.71 0.92 0.83  CALCIUM 8.7 7.6* 7.7* 8.1*   Liver Function  Tests:  Recent Labs Lab 08/11/14 0419 08/11/14 0533 08/12/14 0525 08/13/14 0543  AST ALT ALKPHOS 82 77 61 59  BILITOT 0.9 1.0 1.1 1.2  PROT 6.4 5.9* 5.5* 6.1  ALBUMIN 3.3* 3.1* 2.7* 2.8*    Recent Labs Lab 08/11/14 0419  LIPASE 24   No results for input(s): AMMONIA in the last 168 hours. CBC:  Recent Labs Lab 08/10/14 2250 08/11/14 0533 08/11/14 0850 08/12/14 0525 08/13/14 0543  WBC 9.0 9.6 10.8* 11.3* 10.0  NEUTROABS  --  8.2* 9.0* 8.4* 7.0  HGB 13.7 11.8* 11.5* 10.9* 11.6*  HCT 41.3 34.7* 34.3* 32.6* 35.3*  MCV 95.6 93.8 94.5 95.0 96.4  PLT 231 191 202 178 189   Cardiac Enzymes:  Recent Labs Lab 08/10/14 2245 08/11/14 0419  TROPONINI <0.03 <0.03   BNP (last 3 results) No results for input(s): PROBNP in the last 8760 hours. CBG: No results for input(s): GLUCAP in the last 168 hours.  Recent Results (from the past 240 hour(s))  Culture, blood (routine x 2)     Status: None (Preliminary result)   Collection Time: 08/11/14  6:48 AM  Result Value Ref Range Status   Specimen Description ARM LEFT  Final   Special Requests BOTTLES DRAWN AEROBIC AND ANAEROBIC 8CC EACH  Final   Culture NO GROWTH 2 DAYS  Final   Report Status PENDING  Incomplete  Culture, blood (routine x 2)  Status: None (Preliminary result)   Collection Time: 08/11/14  6:52 AM  Result Value Ref Range Status   Specimen Description HAND LEFT  Final   Special Requests BOTTLES DRAWN AEROBIC AND ANAEROBIC 6CC EACH  Final   Culture NO GROWTH 2 DAYS  Final   Report Status PENDING  Incomplete  Culture, Urine     Status: None   Collection Time: 08/11/14  8:34 AM  Result Value Ref Range Status   Specimen Description URINE, CATHETERIZED  Final   Special Requests NONE  Final   Colony Count NO GROWTH Performed at [facility]   Final   Culture NO GROWTH Performed at [facility]   Final   Report Status 08/13/2014 FINAL  Final     Studies: Dg Chest  1 View  08/12/2014   CLINICAL DATA:  Pneumonia.  Gastroesophageal reflux.  EXAM: CHEST - 1 VIEW  COMPARISON:  08/11/2014  FINDINGS: Progressive right perihilar and infrahilar airspace opacity. The patient is rotated to the Right on today's radiograph, reducing diagnostic sensitivity and specificity. Upper zone pulmonary vascular indistinctness. Upper normal heart size.  IMPRESSION: 1. Progressive right perihilar and infrahilar airspace opacity, suspicious for pneumonia. Followup chest radiography is recommended in 4 weeks time to ensure resolution and exclude underlying malignancy. 2. Mild cephalization of blood flow could reflect pulmonary venous hypertension.   Electronically Signed   By: Herbie Baltimore M.D.   On: 08/12/2014 15:37    Scheduled Meds: . aspirin EC  81 mg Oral q morning - 10a  . clindamycin  300 mg Oral 3 times per day  . heparin  5,000 Units Subcutaneous 3 times per day  . mirtazapine  15 mg Oral QHS  . pantoprazole  40 mg Oral Daily  . potassium chloride  40 mEq Oral Daily  . risperiDONE  1 mg Oral QHS  . sodium chloride  3 mL Intravenous Q12H   Continuous Infusions:   Active Problems:   Dementia   Weakness   Aspiration pneumonia   COPD (chronic obstructive pulmonary disease)   Acute respiratory failure with hypoxia   Hypokalemia   Chronic diastolic congestive heart failure    Time spent:    [facility]  [facility] Pager [phone]. If 7PM-7AM, please contact night-coverage at [email or web address], password [facility] 08/13/2014, 7:43 PM  LOS: 3 days

## 2014-08-14 DIAGNOSIS — F0391 Unspecified dementia with behavioral disturbance: Secondary | ICD-10-CM

## 2014-08-14 LAB — CBC WITH DIFFERENTIAL/PLATELET
BASOS ABS: 0 10*3/uL (ref 0.0–0.1)
Basophils Relative: 0 % (ref 0–1)
EOS PCT: 3 % (ref 0–5)
Eosinophils Absolute: 0.2 10*3/uL (ref 0.0–0.7)
HCT: 35.8 % — ABNORMAL LOW (ref 39.0–52.0)
Hemoglobin: 11.8 g/dL — ABNORMAL LOW (ref 13.0–17.0)
LYMPHS ABS: 1.7 10*3/uL (ref 0.7–4.0)
Lymphocytes Relative: 21 % (ref 12–46)
MCH: 31.7 pg (ref 26.0–34.0)
MCHC: 33 g/dL (ref 30.0–36.0)
MCV: 96.2 fL (ref 78.0–100.0)
MONO ABS: 0.9 10*3/uL (ref 0.1–1.0)
Monocytes Relative: 11 % (ref 3–12)
NEUTROS ABS: 5.5 10*3/uL (ref 1.7–7.7)
Neutrophils Relative %: 65 % (ref 43–77)
PLATELETS: 223 10*3/uL (ref 150–400)
RBC: 3.72 MIL/uL — ABNORMAL LOW (ref 4.22–5.81)
RDW: 14.6 % (ref 11.5–15.5)
WBC: 8.4 10*3/uL (ref 4.0–10.5)

## 2014-08-14 LAB — BASIC METABOLIC PANEL
Anion gap: 5 (ref 5–15)
BUN: 9 mg/dL (ref 6–23)
CHLORIDE: 104 meq/L (ref 96–112)
CO2: 28 mmol/L (ref 19–32)
Calcium: 8.3 mg/dL — ABNORMAL LOW (ref 8.4–10.5)
Creatinine, Ser: 0.87 mg/dL (ref 0.50–1.35)
GFR calc Af Amer: >90 mL/min (ref >90)
GFR calc non Af Amer: 79 mL/min — ABNORMAL LOW (ref >90)
GLUCOSE: 100 mg/dL — AB (ref 70–99)
Potassium: 3.9 mmol/L (ref 3.5–5.1)
Sodium: 137 mmol/L (ref 135–145)

## 2014-08-14 LAB — URINE CULTURE
Colony Count: NO GROWTH
Culture: NO GROWTH

## 2014-08-14 NOTE — Progress Notes (Signed)
Speech Language Pathology Treatment: Dysphagia  Patient Details Name: Chad Everett MRN: [id number] DOB: 03/19/1931 Today's Date: 08/14/2014 Time: 1215-1225 SLP Time Calculation (min) (ACUTE ONLY): 10 min  Assessment / Plan / Recommendation Clinical Impression  Chad Everett had just finished his lunch meal upon SLP arrival. CNA assisted with his meal and said that he "did very well" with lunch. He did do some coughing with his sausage in AM. Pt agreeable to water via straw sips and he showed no overt signs or symptoms of aspiration or penetration. D2/thin continues to be appropriate. SLP will follow for upgrades as appropriate.   HPI HPI: Chad Everett is an 81 y/o m prior h/o syncope, gerd, false-positive stress test anxiety 07/02/2004 with negative cardiac catheterization LVH, potential COPD, weakness , moderate to severe dementia admitted overnight 08/11/14 with weakness, lactic acidosis with unclear etiology and found ultimately to have a temperature of 101.6, urinalysis showed specific gravity 1.030 trace hemoglobin greater than 100 protein but no leukocyte esterase or nitrates., 2 view chest x-ray showed no active process, CT of the head showed no acute intracranial abnormality, CT abdomen pelvis showed no acute abnormality. Patient ultimately was found to have a potential pneumonia and was started on ceftriaxone and azithromycin. MD notes "Potential aspiration pneumonia-transitioned ceftriaxone and azithromycin to oral clindamycin 300 mg every 8 hourly. We will ask speech therapy to see the patient in consult. Saline lock IV. Lactic acid trending downward, Pro calcitonin negligible. We will change patient's diet in the interim to a chopped dysphagia 2 diet-I suspect the patient will need some amount of support at home with home health and may benefit from skilled nursing facility placement". SLP asked to evaluate swallow function.   Pertinent Vitals Pain Assessment: No/denies pain  SLP Plan  Continue  with current plan of care    Recommendations Diet recommendations: Dysphagia 2 (fine chop);Thin liquid Liquids provided via: Cup;Straw Medication Administration: Whole meds with puree Supervision: Full supervision/cueing for compensatory strategies Compensations: Slow rate;Small sips/bites;Multiple dry swallows after each bite/sip Postural Changes and/or Swallow Maneuvers: Seated upright 90 degrees;Upright 30-60 min after meal             Oral Care Recommendations: Oral care BID;Staff/trained caregiver to provide oral care Follow up Recommendations: 24 hour supervision/assistance Plan: Continue with current plan of care   Thank you,  Havery Moros, CCC-SLP [phone]      PORTER,DABNEY 08/14/2014, 1:47 PM

## 2014-08-14 NOTE — Evaluation (Signed)
Physical Therapy Evaluation Patient Details Name: Chad Everett MRN: [id number] DOB: [birth year] Today's Date: 08/14/2014   History of Present Illness  History of Present Illness:This is a 81 y.o. year old male with significant past medical history of dementia, GERD, anxiety presenting with weakness, lactic acidosis. Level V caveat as pt poor historian in setting of dementia. No family at bedside. Per report, pt w/ cold like sxs over last 2 days. + cough. Unclear about fever. Was acting confused at dinner yesterday with family. Had some ? Slurred speech. Pt denies any CP, SOB. Does report weakness. Minimal to mild abd pain.    Clinical Impression  Pt was seen for evaluation.  He was alert and very pleasant/cooperative but quite confused.  He was not oriented to place, time or situation.  He is currently functioning at his baseline.  He states that he uses a walker or cane for gait.  He was independent with transfers and ambulated 350' using a walker with good stability.  I will refer him to nursing service to continue gait while in house.    Follow Up Recommendations No PT follow up    Equipment Recommendations  None recommended by PT    Recommendations for Other Services   none    Precautions / Restrictions Precautions Precautions: Fall Restrictions Weight Bearing Restrictions: No      Mobility  Bed Mobility Overal bed mobility: Modified Independent                Transfers Overall transfer level: Modified independent Equipment used: Rolling walker (2 wheeled)                Ambulation/Gait Ambulation/Gait assistance: Modified independent (Device/Increase time) Ambulation Distance (Feet): 350 Feet Assistive device: Rolling walker (2 wheeled) Gait Pattern/deviations: Trunk flexed;Shuffle   Gait velocity interpretation: at or above normal speed for age/gender General Gait Details: gait is stable  Stairs            Wheelchair Mobility    Modified Rankin  (Stroke Patients Only)       Balance Overall balance assessment: No apparent balance deficits (not formally assessed)                                           Pertinent Vitals/Pain Pain Assessment: No/denies pain    Home Living Family/patient expects to be discharged to:: Private residence Living Arrangements: Other relatives Available Help at Discharge: Family             Additional Comments: pt is unable to provide information above    Prior Function Level of Independence:  (unknown)               Hand Dominance        Extremity/Trunk Assessment   Upper Extremity Assessment: Overall WFL for tasks assessed           Lower Extremity Assessment: Overall WFL for tasks assessed         Communication   Communication: HOH  Cognition Arousal/Alertness: Awake/alert Behavior During Therapy: WFL for tasks assessed/performed Overall Cognitive Status: History of cognitive impairments - at baseline                      General Comments      Exercises        Assessment/Plan    PT Assessment Patent does not need any  further PT services  PT Diagnosis     PT Problem List    PT Treatment Interventions     PT Goals (Current goals can be found in the Care Plan section) Acute Rehab PT Goals PT Goal Formulation: All assessment and education complete, DC therapy    Frequency     Barriers to discharge  unknown      Co-evaluation               End of Session Equipment Utilized During Treatment: Gait belt Activity Tolerance: Patient tolerated treatment well Patient left: in chair;with call bell/phone within reach;with nursing/sitter in room           Time: 1610-9604 PT Time Calculation (min) (ACUTE ONLY): 28 min   Charges:   PT Evaluation $Initial PT Evaluation Tier I: 1 Procedure     PT G CodesMyrlene Broker L 08/14/2014, 9:36 AM

## 2014-08-14 NOTE — Progress Notes (Signed)
ANTIBIOTIC CONSULT NOTE - INITIAL  Pharmacy Consult for Zosyn Indication: aspiration pna  Allergies  Allergen Reactions  . Onion    Patient Measurements: Height:  (167.6 cm) (per patient ) Weight: 141 lb 8.6 oz (64.2 kg) IBW/kg (Calculated) : 63.8  Vital Signs: Temp: 98.4 F (36.9 C) (01/04 0617) Temp Source: Oral (01/04 0617) BP: 154/54 mmHg (01/04 0617) Pulse Rate: 74 (01/04 0617) Intake/Output from previous day: 01/03 0701 - 01/04 0700 In: 100 [IV Piggyback:100] Out: 2700 [Urine:2700] Intake/Output from this shift: Total I/O In: 240 [P.O.:240] Out: 100 [Urine:100]  Labs:  Recent Labs  08/12/14 0525 08/13/14 0543 08/14/14 0530  WBC 11.3* 10.0 8.4  HGB 10.9* 11.6* 11.8*  PLT 178 189 223  CREATININE 0.92 0.83 0.87   Estimated Creatinine Clearance: 60.1 mL/min (by C-G formula based on Cr of 0.87). No results for input(s): VANCOTROUGH, VANCOPEAK, VANCORANDOM, GENTTROUGH, GENTPEAK, GENTRANDOM, TOBRATROUGH, TOBRAPEAK, TOBRARND, AMIKACINPEAK, AMIKACINTROU, AMIKACIN in the last 72 hours.   Microbiology: Recent Results (from the past 720 hour(s))  Culture, blood (routine x 2)     Status: None (Preliminary result)   Collection Time: 08/11/14  6:48 AM  Result Value Ref Range Status   Specimen Description ARM LEFT  Final   Special Requests BOTTLES DRAWN AEROBIC AND ANAEROBIC 8CC EACH  Final   Culture NO GROWTH 3 DAYS  Final   Report Status PENDING  Incomplete  Culture, blood (routine x 2)     Status: None (Preliminary result)   Collection Time: 08/11/14  6:52 AM  Result Value Ref Range Status   Specimen Description HAND LEFT  Final   Special Requests BOTTLES DRAWN AEROBIC AND ANAEROBIC 6CC EACH  Final   Culture NO GROWTH 3 DAYS  Final   Report Status PENDING  Incomplete  Culture, Urine     Status: None   Collection Time: 08/11/14  8:34 AM  Result Value Ref Range Status   Specimen Description URINE, CATHETERIZED  Final   Special Requests NONE  Final   Colony  Count NO GROWTH Performed at [facility]   Final   Culture NO GROWTH Performed at [facility]   Final   Report Status 08/13/2014 FINAL  Final  Urine culture     Status: None   Collection Time: 08/11/14  8:34 AM  Result Value Ref Range Status   Specimen Description URINE, CATHETERIZED  Final   Special Requests NONE  Final   Colony Count NO GROWTH Performed at [facility]   Final   Culture NO GROWTH Performed at [facility]   Final   Report Status 08/14/2014 FINAL  Final    Medical History: Past Medical History  Diagnosis Date  . Dementia   . GERD (gastroesophageal reflux disease)   . Anxiety    Anti-infectives    Start     Dose/Rate Route Frequency Ordered Stop   08/13/14 2000  piperacillin-tazobactam (ZOSYN) IVPB 3.375 g     3.375 g12.5 mL/hr over 240 Minutes Intravenous Every 8 hours 08/13/14 1958     08/12/14 1430  clindamycin (CLEOCIN) capsule 300 mg  Status:  Discontinued     300 mg Oral 3 times per day 08/12/14 1420 08/13/14 1945   08/11/14 1900  azithromycin (ZITHROMAX) tablet 500 mg  Status:  Discontinued     500 mg Oral Daily 08/11/14 1858 08/11/14 1859   08/11/14 1900  azithromycin (ZITHROMAX) tablet 500 mg  Status:  Discontinued     500 mg Oral Daily  08/11/14 1859 08/12/14 1420   08/11/14 1615  cefTRIAXone (ROCEPHIN) 1 g in dextrose 5 % 50 mL IVPB - Premix  Status:  Discontinued     1 g100 mL/hr over 30 Minutes Intravenous Every 24 hours 08/11/14 1611 08/12/14 1420     Assessment: Assessment/Plan: 1. Aspiration pneumonia. Initially started on Rocephin and azithromycin. Patient continues to have fevers. Will change antibiotic therapy to Zosyn for better anaerobic coverage. Seen by speech therapy recommendations were for dysphagia 2 diet. Continue current treatments. 2. Acute encephalopathy superimposed on underlying dementia. Appears to be approaching his baseline. 3. COPD. No evidence of wheezing. 4. Acute respiratory  failure with hypoxia. Related to #1. Wean off oxygen as tolerated.  Goal of Therapy:  Eradicate infection.  Plan:  Zosyn 3.375gm IV q8h, each dose over 4 hrs Monitor labs, cultures, progress, and LOT Deescalate abx when improved / appropriate  Margo Aye, Lynnann Knudsen A 08/14/2014,10:50 AM

## 2014-08-14 NOTE — Progress Notes (Signed)
[facility] PROGRESS NOTE  Chad Everett ZOX:[id number] DOB: 1931/07/29 DOA: 08/10/2014 PCP: No primary care provider on file.  Assessment/Plan: 1. Aspiration pneumonia. Currently on Zosyn for antibiotic coverage. If remains afebrile overnight, will consider transition to Augmentin tomorrow. Seen by speech therapy recommendations were for dysphagia 2 diet. Continue current treatments. 2. Acute encephalopathy superimposed on underlying dementia. Appears to be approaching his baseline. 3. COPD. No evidence of wheezing. 4. Acute respiratory failure with hypoxia. Related to #1. Wean off oxygen as tolerated. 5. Chronic diastolic congestive heart failure. Appears compensated at this point. 6. Hypokalemia. Replace  Code Status: full code Family Communication: no family present Disposition Plan: Discharge home once improved, likely in a.m.   Consultants:    Procedures:    Antibiotics:  Ceftriaxone 08/11/14>>1/2  Azithromycin 08/11/14>>1/2  Zosyn 1/3  HPI/Subjective: Confused. Denies any complaints  Objective: Filed Vitals:   08/14/14 1412  BP: 131/62  Pulse: 77  Temp: 97.9 F (36.6 C)  Resp: 20    Intake/Output Summary (Last 24 hours) at 08/14/14 1946 Last data filed at 08/14/14 1232  Gross per 24 hour  Intake    580 ml  Output   2700 ml  Net  -2120 ml   Filed Weights   08/12/14 0631 08/13/14 0528 08/14/14 0617  Weight: 67 kg (147 lb 11.3 oz) 64.9 kg (143 lb 1.3 oz) 64.2 kg (141 lb 8.6 oz)    Exam:   General:  NAD  Cardiovascular: s1, s2, rrr  Respiratory: crackles at bases  Abdomen: soft, nt, nd, bs+  Musculoskeletal: no edema b/l   Data Reviewed: Basic Metabolic Panel:  Recent Labs Lab 08/10/14 2250 08/11/14 0533 08/12/14 0525 08/13/14 0543 08/14/14 0530  NA 137 132* 134* 140 137  K 3.7 3.5 3.1* 3.3* 3.9  CL 101 102 106 108 104  CO2 GLUCOSE 155* 146* 97 103* 100*  BUN CREATININE 0.98 0.71 0.92 0.83 0.87   CALCIUM 8.7 7.6* 7.7* 8.1* 8.3*   Liver Function Tests:  Recent Labs Lab 08/11/14 0419 08/11/14 0533 08/12/14 0525 08/13/14 0543  AST ALT ALKPHOS 82 77 61 59  BILITOT 0.9 1.0 1.1 1.2  PROT 6.4 5.9* 5.5* 6.1  ALBUMIN 3.3* 3.1* 2.7* 2.8*    Recent Labs Lab 08/11/14 0419  LIPASE 24   No results for input(s): AMMONIA in the last 168 hours. CBC:  Recent Labs Lab 08/11/14 0533 08/11/14 0850 08/12/14 0525 08/13/14 0543 08/14/14 0530  WBC 9.6 10.8* 11.3* 10.0 8.4  NEUTROABS 8.2* 9.0* 8.4* 7.0 5.5  HGB 11.8* 11.5* 10.9* 11.6* 11.8*  HCT 34.7* 34.3* 32.6* 35.3* 35.8*  MCV 93.8 94.5 95.0 96.4 96.2  PLT 191 202 178 189 223   Cardiac Enzymes:  Recent Labs Lab 08/10/14 2245 08/11/14 0419  TROPONINI <0.03 <0.03   BNP (last 3 results) No results for input(s): PROBNP in the last 8760 hours. CBG: No results for input(s): GLUCAP in the last 168 hours.  Recent Results (from the past 240 hour(s))  Culture, blood (routine x 2)     Status: None (Preliminary result)   Collection Time: 08/11/14  6:48 AM  Result Value Ref Range Status   Specimen Description ARM LEFT  Final   Special Requests BOTTLES DRAWN AEROBIC AND ANAEROBIC 8CC EACH  Final   Culture NO GROWTH 3 DAYS  Final   Report Status PENDING  Incomplete  Culture, blood (  routine x 2)     Status: None (Preliminary result)   Collection Time: 08/11/14  6:52 AM  Result Value Ref Range Status   Specimen Description HAND LEFT  Final   Special Requests BOTTLES DRAWN AEROBIC AND ANAEROBIC 6CC EACH  Final   Culture NO GROWTH 3 DAYS  Final   Report Status PENDING  Incomplete  Culture, Urine     Status: None   Collection Time: 08/11/14  8:34 AM  Result Value Ref Range Status   Specimen Description URINE, CATHETERIZED  Final   Special Requests NONE  Final   Colony Count NO GROWTH Performed at [facility]   Final   Culture NO GROWTH Performed at [facility]   Final   Report  Status 08/13/2014 FINAL  Final  Urine culture     Status: None   Collection Time: 08/11/14  8:34 AM  Result Value Ref Range Status   Specimen Description URINE, CATHETERIZED  Final   Special Requests NONE  Final   Colony Count NO GROWTH Performed at [facility]   Final   Culture NO GROWTH Performed at [facility]   Final   Report Status 08/14/2014 FINAL  Final     Studies: No results found.  Scheduled Meds: . aspirin EC  81 mg Oral q morning - 10a  . heparin  5,000 Units Subcutaneous 3 times per day  . mirtazapine  15 mg Oral QHS  . pantoprazole  40 mg Oral Daily  . piperacillin-tazobactam (ZOSYN)  IV  3.375 g Intravenous Q8H  . potassium chloride  40 mEq Oral Daily  . risperiDONE  1 mg Oral QHS  . sodium chloride  3 mL Intravenous Q12H   Continuous Infusions:   Active Problems:   Dementia   Weakness   Aspiration pneumonia   COPD (chronic obstructive pulmonary disease)   Acute respiratory failure with hypoxia   Hypokalemia   Chronic diastolic congestive heart failure    Time spent:    [facility]  [facility] Pager [phone]. If 7PM-7AM, please contact night-coverage at [email or web address], password [facility] 08/14/2014, 7:46 PM  LOS: 4 days

## 2014-08-15 LAB — CBC WITH DIFFERENTIAL/PLATELET
BASOS PCT: 0 % (ref 0–1)
Basophils Absolute: 0 10*3/uL (ref 0.0–0.1)
EOS PCT: 3 % (ref 0–5)
Eosinophils Absolute: 0.2 10*3/uL (ref 0.0–0.7)
HCT: 36 % — ABNORMAL LOW (ref 39.0–52.0)
HEMOGLOBIN: 11.9 g/dL — AB (ref 13.0–17.0)
Lymphocytes Relative: 22 % (ref 12–46)
Lymphs Abs: 1.6 10*3/uL (ref 0.7–4.0)
MCH: 31.6 pg (ref 26.0–34.0)
MCHC: 33.1 g/dL (ref 30.0–36.0)
MCV: 95.7 fL (ref 78.0–100.0)
Monocytes Absolute: 0.9 10*3/uL (ref 0.1–1.0)
Monocytes Relative: 13 % — ABNORMAL HIGH (ref 3–12)
NEUTROS ABS: 4.4 10*3/uL (ref 1.7–7.7)
NEUTROS PCT: 62 % (ref 43–77)
Platelets: 282 10*3/uL (ref 150–400)
RBC: 3.76 MIL/uL — AB (ref 4.22–5.81)
RDW: 14.5 % (ref 11.5–15.5)
WBC: 7.1 10*3/uL (ref 4.0–10.5)

## 2014-08-15 MED ORDER — AMOXICILLIN-POT CLAVULANATE 875-125 MG PO TABS: 1.0000 | ORAL_TABLET | Freq: Two times a day (BID) | ORAL | Status: DC

## 2014-08-15 NOTE — Progress Notes (Signed)
Speech Language Pathology Treatment: Dysphagia  Patient Details Name: Chad Everett MRN: [id number] DOB: 1931/06/29 Today's Date: 08/15/2014 Time: 4270-6237 SLP Time Calculation (min) (ACUTE ONLY): 25 min  Assessment / Plan / Recommendation Clinical Impression  Pt sleeping soundly with lights off, shade down upon SLP arrival; sitter present. Pt did not eat breakfast due to somnolence. Pt roused with lights on and repositioned in bed with assist from sitter. Pt observed with thin liquids via straw sips and graham crackers. Mastication is slow and deliberate due to pt only having upper dentures (edentulous lower), min lingual residue clears with liquid wash. Sitter reported that pt had some difficulty masticating sausage yesterday (some coughing). Will upgrade diet texture to D3/mechanical soft and continue thin liquids. No family present at this time; pt with cognitive impairment.   HPI HPI: Chad Everett is an 81 y/o m prior h/o syncope, gerd, false-positive stress test anxiety 07/02/2004 with negative cardiac catheterization LVH, potential COPD, weakness , moderate to severe dementia admitted overnight 08/11/14 with weakness, lactic acidosis with unclear etiology and found ultimately to have a temperature of 101.6, urinalysis showed specific gravity 1.030 trace hemoglobin greater than 100 protein but no leukocyte esterase or nitrates., 2 view chest x-ray showed no active process, CT of the head showed no acute intracranial abnormality, CT abdomen pelvis showed no acute abnormality. Patient ultimately was found to have a potential pneumonia and was started on ceftriaxone and azithromycin. MD notes "Potential aspiration pneumonia-transitioned ceftriaxone and azithromycin to oral clindamycin 300 mg every 8 hourly. We will ask speech therapy to see the patient in consult. Saline lock IV. Lactic acid trending downward, Pro calcitonin negligible. We will change patient's diet in the interim to a chopped dysphagia 2  diet-I suspect the patient will need some amount of support at home with home health and may benefit from skilled nursing facility placement". SLP asked to evaluate swallow function.   Pertinent Vitals Pain Assessment: No/denies pain  SLP Plan  All goals met    Recommendations Diet recommendations: Dysphagia 3 (mechanical soft);Thin liquid Liquids provided via: Cup;Straw Medication Administration: Whole meds with puree Supervision: Intermittent supervision to cue for compensatory strategies (due to cognitive impairment; set up assist) Compensations: Slow rate;Small sips/bites;Multiple dry swallows after each bite/sip Postural Changes and/or Swallow Maneuvers: Seated upright 90 degrees;Upright 30-60 min after meal              Oral Care Recommendations: Oral care BID;Staff/trained caregiver to provide oral care Follow up Recommendations: 24 hour supervision/assistance Plan: All goals met    Thank you,  Genene Churn, [place]      [place] 08/15/2014, 12:03 PM

## 2014-08-15 NOTE — Progress Notes (Signed)
Called patient's family at [phone] to notify them of patient discharge.  Patient's family had been notified earlier of patient's discharged and told daytime nurse they were on their way, were having family issues. Spoke to patient's daughter Lurline HareShana, [name] stated her boyfriend was on the way to pick up patient, that he was unfamiliar with the area but should be there soon.  As of 2130, family has not arrived.  Will continue to monitor patient, sitter with patient in room at bedside.

## 2014-08-15 NOTE — Progress Notes (Signed)
Patient's family arrived to pick up patient.  Went over discharge instructions with family member, no written prescriptions seen at this time in patient's chart.  Family verbalized understanding.  Patient placed in wheelchair, wheeled outside and assisted into vehicle.  Patient left floor in stable condition.  Patient's hearing aid and watch placed in white patient belonging bag and sent with patient.

## 2014-08-16 LAB — CULTURE, BLOOD (ROUTINE X 2)
CULTURE: NO GROWTH
CULTURE: NO GROWTH

## 2014-08-16 NOTE — Discharge Summary (Signed)
Physician Discharge Summary  Logon Uttech ZOX:[id number] DOB: 1931-07-12 DOA: 08/10/2014  PCP: No primary care provider on file.  Admit date: 08/10/2014 Discharge date: 08/15/2014  Time spent: 40 minutes  Recommendations for Outpatient Follow-up:  1. Follow up with primary care doctor in 1-2 weeks  Discharge Diagnoses:  Active Problems:   Dementia   Weakness   Aspiration pneumonia   COPD (chronic obstructive pulmonary disease)   Acute respiratory failure with hypoxia   Hypokalemia   Chronic diastolic congestive heart failure   Discharge Condition: improved  Diet recommendation: low salt, mechanical soft, thin liquids  Filed Weights   08/13/14 0528 08/14/14 0617 08/15/14 0458  Weight: 64.9 kg (143 lb 1.3 oz) 64.2 kg (141 lb 8.6 oz) 63.141 kg (139 lb 3.2 oz)    History of present illness:  This patient presented to the hospital with cough and possible fever. He was also noted to have generalized weakness and lactic acidosis. He did not have any clear source of infection on admission, so he was admitted for further evaluation.   Hospital Course:  Patient's generalized weakness was felt to be related to his underlying infectious process. Stroke work up was negative. He has clinically improved since admission.  He was found to have a pneumonia on imaging and had intermittent fevers in the hospital. He was started on appropriate antibiotics and appears to have clinically improved. He has not had any further fevers and wbc count is normal. He will be transitioned to oral augmentin to complete his course. He was seen by speech therapy who at the time of discharge have recommended mechanical soft diet with thin liquids  The remainder of his medical issues remained stable.  Procedures:    Consultations:  Speech therapy  Discharge Exam: Filed Vitals:   08/15/14 2224  BP: 108/50  Pulse: 76  Temp: 100.2 F (37.9 C)  Resp: 20    General: NAD Cardiovascular: s1, s2,  rrr Respiratory: cta b  Discharge Instructions   Discharge Instructions    Call MD for:  difficulty breathing, headache or visual disturbances    Complete by:  As directed      Call MD for:  temperature >100.4    Complete by:  As directed      Diet - low sodium heart healthy    Complete by:  As directed      Increase activity slowly    Complete by:  As directed           Discharge Medication List as of 08/15/2014  5:23 PM    START taking these medications   Details  amoxicillin-clavulanate (AUGMENTIN) 875-125 MG per tablet Take 1 tablet by mouth 2 (two) times daily., Starting 08/15/2014, Until Discontinued, Print      CONTINUE these medications which have NOT CHANGED   Details  albuterol (PROVENTIL HFA;VENTOLIN HFA) 108 (90 BASE) MCG/ACT inhaler Inhale 2 puffs into the lungs every 6 (six) hours as needed for wheezing or shortness of breath., Until Discontinued, Historical Med    aspirin EC 81 MG tablet Take 81 mg by mouth every morning. , Until Discontinued, Historical Med    mirtazapine (REMERON) 15 MG tablet Take 15 mg by mouth at bedtime., Until Discontinued, Historical Med    omeprazole (PRILOSEC) 20 MG capsule Take 20 mg by mouth daily., Until Discontinued, Historical Med    risperiDONE (RISPERDAL) 1 MG tablet Take 1 mg by mouth at bedtime., Until Discontinued, Historical Med       Allergies  Allergen  Reactions  . Onion    Follow-up Information    Follow up with follow up with primary care physician in 1-2 weeks.       The results of significant diagnostics from this hospitalization (including imaging, microbiology, ancillary and laboratory) are listed below for reference.    Significant Diagnostic Studies: Dg Chest 1 View  08/12/2014   CLINICAL DATA:  Pneumonia.  Gastroesophageal reflux.  EXAM: CHEST - 1 VIEW  COMPARISON:  08/11/2014  FINDINGS: Progressive right perihilar and infrahilar airspace opacity. The patient is rotated to the Right on today's radiograph,  reducing diagnostic sensitivity and specificity. Upper zone pulmonary vascular indistinctness. Upper normal heart size.  IMPRESSION: 1. Progressive right perihilar and infrahilar airspace opacity, suspicious for pneumonia. Followup chest radiography is recommended in 4 weeks time to ensure resolution and exclude underlying malignancy. 2. Mild cephalization of blood flow could reflect pulmonary venous hypertension.   Electronically Signed   By: Herbie BaltimoreWalt  Liebkemann M.D.   On: 08/12/2014 15:37   Dg Chest 2 View  08/11/2014   CLINICAL DATA:  Shortness of breath.  EXAM: CHEST - 2 VIEW  COMPARISON:  08/10/2014  FINDINGS: Stable COPD. More focal opacity in the right perihilar region present compared to the prior chest x-ray which may be consistent with developing pneumonia. No edema, pneumothorax or pleural effusion. The heart size and mediastinal contours are normal.  IMPRESSION: Developing right perihilar opacity which may represent pneumonia. Otherwise stable COPD.   Electronically Signed   By: Irish LackGlenn  Yamagata M.D.   On: 08/11/2014 14:27   Dg Chest 2 View (if Patient Has Fever And/or Copd)  08/11/2014   CLINICAL DATA:  Cold symptoms for 2 days. Short of breath. Weakness. Initial encounter.  EXAM: CHEST  2 VIEW  COMPARISON:  04/06/2011.  FINDINGS: Cardiopericardial silhouette within normal limits. Mediastinal contours normal. Trachea midline. No airspace disease or effusion. Monitoring leads project over the chest. Lung volumes are lower than on the prior exam.  IMPRESSION: No active cardiopulmonary disease.   Electronically Signed   By: Andreas NewportGeoffrey  Lamke M.D.   On: 08/11/2014 00:11   Ct Head Wo Contrast  08/11/2014   CLINICAL DATA:  Altered mental status  EXAM: CT HEAD WITHOUT CONTRAST  TECHNIQUE: Contiguous axial images were obtained from the base of the skull through the vertex without intravenous contrast.  COMPARISON:  None.  FINDINGS: Skull and Sinuses:Negative for fracture or destructive process. The mastoids,  middle ears, and imaged paranasal sinuses are clear.  Orbits: No acute abnormality.  Brain: No evidence of acute infarction, hemorrhage, hydrocephalus, or mass lesion/mass effect.  There is diffuse brain atrophy with prominent hippocampal volume loss that is suggestive of Alzheimer's disease in this patient with history of dementia. There is mild small-vessel ischemic change around the lateral ventricles, typical for age.  IMPRESSION: 1. No acute intracranial disease. 2. Brain atrophy, as above.   Electronically Signed   By: Tiburcio PeaJonathan  Watts M.D.   On: 08/11/2014 00:24   Ct Abdomen Pelvis W Contrast  08/11/2014   CLINICAL DATA:  Abdominal pain with vomiting and diarrhea  EXAM: CT ABDOMEN AND PELVIS WITH CONTRAST  TECHNIQUE: Multidetector CT imaging of the abdomen and pelvis was performed using the standard protocol following bolus administration of intravenous contrast.  CONTRAST:  25mL OMNIPAQUE IOHEXOL 300 MG/ML SOLN, 100mL OMNIPAQUE IOHEXOL 300 MG/ML SOLN  COMPARISON:  None.  FINDINGS: Diagnostic quality limited by motion.  BODY WALL: Unremarkable.  LOWER CHEST: Unremarkable.  ABDOMEN/PELVIS:  Liver: No focal abnormality.  Biliary: No evidence of biliary obstruction or stone.  Pancreas: Unremarkable.  Spleen: Unremarkable.  Adrenals: 8 mm nodule in the left adrenal gland. In this patient with no indication of malignancy history, this is likely an incidental adenoma.  Kidneys and ureters: Bilateral nephrolithiasis with stones measuring up to 5 mm. No hydronephrosis or ureteral calculus.  Bladder: Small stone at the bladder base. There is no indication of recent passage.  Reproductive: Unremarkable for age.  Bowel: No obstruction. Normal appendix.  Retroperitoneum: No mass or adenopathy.  Peritoneum: No ascites or pneumoperitoneum.  Vascular: No acute abnormality.  OSSEOUS: No acute abnormalities.  IMPRESSION: 1. No findings to explain acute abdominal pain. 2. Bilateral nephrolithiasis.   Electronically Signed    By: Tiburcio Pea M.D.   On: 08/11/2014 05:30    Microbiology: Recent Results (from the past 240 hour(s))  Culture, blood (routine x 2)     Status: None (Preliminary result)   Collection Time: 08/11/14  6:48 AM  Result Value Ref Range Status   Specimen Description BLOOD LEFT ARM  Final   Special Requests BOTTLES DRAWN AEROBIC AND ANAEROBIC 8CC EACH  Final   Culture NO GROWTH 4 DAYS  Final   Report Status PENDING  Incomplete  Culture, blood (routine x 2)     Status: None (Preliminary result)   Collection Time: 08/11/14  6:52 AM  Result Value Ref Range Status   Specimen Description BLOOD LEFT HAND  Final   Special Requests BOTTLES DRAWN AEROBIC AND ANAEROBIC 6CC EACH  Final   Culture NO GROWTH 4 DAYS  Final   Report Status PENDING  Incomplete  Culture, Urine     Status: None   Collection Time: 08/11/14  8:34 AM  Result Value Ref Range Status   Specimen Description URINE, CATHETERIZED  Final   Special Requests NONE  Final   Colony Count NO GROWTH Performed at [facility]   Final   Culture NO GROWTH Performed at [facility]   Final   Report Status 08/13/2014 FINAL  Final  Urine culture     Status: None   Collection Time: 08/11/14  8:34 AM  Result Value Ref Range Status   Specimen Description URINE, CATHETERIZED  Final   Special Requests NONE  Final   Colony Count NO GROWTH Performed at [facility]   Final   Culture NO GROWTH Performed at [facility]   Final   Report Status 08/14/2014 FINAL  Final     Labs: Basic Metabolic Panel:  Recent Labs Lab 08/10/14 2250 08/11/14 0533 08/12/14 0525 08/13/14 0543 08/14/14 0530  NA 137 132* 134* 140 137  K 3.7 3.5 3.1* 3.3* 3.9  CL 101 102 106 108 104  CO2 GLUCOSE 155* 146* 97 103* 100*  BUN CREATININE 0.98 0.71 0.92 0.83 0.87  CALCIUM 8.7 7.6* 7.7* 8.1* 8.3*   Liver Function Tests:  Recent Labs Lab 08/11/14 0419 08/11/14 0533 08/12/14 0525  08/13/14 0543  AST ALT ALKPHOS 82 77 61 59  BILITOT 0.9 1.0 1.1 1.2  PROT 6.4 5.9* 5.5* 6.1  ALBUMIN 3.3* 3.1* 2.7* 2.8*    Recent Labs Lab 08/11/14 0419  LIPASE 24   No results for input(s): AMMONIA in the last 168 hours. CBC:  Recent Labs Lab 08/11/14 0850 08/12/14 0525 08/13/14 0543 08/14/14 0530 08/15/14 0529  WBC 10.8* 11.3* 10.0 8.4  7.1  NEUTROABS 9.0* 8.4* 7.0 5.5 4.4  HGB 11.5* 10.9* 11.6* 11.8* 11.9*  HCT 34.3* 32.6* 35.3* 35.8* 36.0*  MCV 94.5 95.0 96.4 96.2 95.7  PLT 202 178 189 223 282   Cardiac Enzymes:  Recent Labs Lab 08/10/14 2245 08/11/14 0419  TROPONINI <0.03 <0.03   BNP: BNP (last 3 results) No results for input(s): PROBNP in the last 8760 hours. CBG: No results for input(s): GLUCAP in the last 168 hours.     Signed:  Harlyn Italiano  [facility] 08/16/2014, 12:03 AM

## 2014-08-16 NOTE — Progress Notes (Signed)
UR chart review completed.  

## 2014-10-18 ENCOUNTER — Inpatient Hospital Stay: Payer: Self-pay | Admitting: Specialist

## 2014-10-21 ENCOUNTER — Inpatient Hospital Stay (HOSPITAL_COMMUNITY)
Admission: EM | Admit: 2014-10-21 | Discharge: 2014-10-25 | DRG: 177 | Disposition: A | Discharge status: Home / Self Care | Payer: Medicare Other | Attending: Internal Medicine | Admitting: Internal Medicine

## 2014-10-21 ENCOUNTER — Emergency Department (HOSPITAL_COMMUNITY): Payer: Medicare Other

## 2014-10-21 ENCOUNTER — Encounter (HOSPITAL_COMMUNITY): Payer: Self-pay | Admitting: Emergency Medicine

## 2014-10-21 DIAGNOSIS — R262 Difficulty in walking, not elsewhere classified: Secondary | ICD-10-CM | POA: Diagnosis present

## 2014-10-21 DIAGNOSIS — J9601 Acute respiratory failure with hypoxia: Secondary | ICD-10-CM | POA: Diagnosis present

## 2014-10-21 DIAGNOSIS — I5031 Acute diastolic (congestive) heart failure: Secondary | ICD-10-CM

## 2014-10-21 DIAGNOSIS — E876 Hypokalemia: Secondary | ICD-10-CM | POA: Diagnosis present

## 2014-10-21 DIAGNOSIS — Z87891 Personal history of nicotine dependence: Secondary | ICD-10-CM

## 2014-10-21 DIAGNOSIS — Z66 Do not resuscitate: Secondary | ICD-10-CM | POA: Diagnosis present

## 2014-10-21 DIAGNOSIS — R1310 Dysphagia, unspecified: Secondary | ICD-10-CM | POA: Diagnosis present

## 2014-10-21 DIAGNOSIS — Z7982 Long term (current) use of aspirin: Secondary | ICD-10-CM

## 2014-10-21 DIAGNOSIS — R0609 Other forms of dyspnea: Secondary | ICD-10-CM

## 2014-10-21 DIAGNOSIS — F0390 Unspecified dementia without behavioral disturbance: Secondary | ICD-10-CM | POA: Diagnosis present

## 2014-10-21 DIAGNOSIS — E860 Dehydration: Secondary | ICD-10-CM | POA: Diagnosis present

## 2014-10-21 DIAGNOSIS — Y95 Nosocomial condition: Secondary | ICD-10-CM | POA: Diagnosis present

## 2014-10-21 DIAGNOSIS — J690 Pneumonitis due to inhalation of food and vomit: Principal | ICD-10-CM | POA: Diagnosis present

## 2014-10-21 DIAGNOSIS — I5032 Chronic diastolic (congestive) heart failure: Secondary | ICD-10-CM | POA: Diagnosis present

## 2014-10-21 DIAGNOSIS — J189 Pneumonia, unspecified organism: Secondary | ICD-10-CM | POA: Insufficient documentation

## 2014-10-21 DIAGNOSIS — R0902 Hypoxemia: Secondary | ICD-10-CM

## 2014-10-21 DIAGNOSIS — F419 Anxiety disorder, unspecified: Secondary | ICD-10-CM | POA: Diagnosis present

## 2014-10-21 DIAGNOSIS — K219 Gastro-esophageal reflux disease without esophagitis: Secondary | ICD-10-CM | POA: Diagnosis present

## 2014-10-21 DIAGNOSIS — R531 Weakness: Secondary | ICD-10-CM

## 2014-10-21 DIAGNOSIS — G9340 Encephalopathy, unspecified: Secondary | ICD-10-CM | POA: Diagnosis present

## 2014-10-21 DIAGNOSIS — R0989 Other specified symptoms and signs involving the circulatory and respiratory systems: Secondary | ICD-10-CM

## 2014-10-21 DIAGNOSIS — Z9102 Food additives allergy status: Secondary | ICD-10-CM

## 2014-10-21 DIAGNOSIS — R0600 Dyspnea, unspecified: Secondary | ICD-10-CM | POA: Diagnosis present

## 2014-10-21 DIAGNOSIS — R0602 Shortness of breath: Secondary | ICD-10-CM | POA: Diagnosis not present

## 2014-10-21 DIAGNOSIS — F0280 Dementia in other diseases classified elsewhere without behavioral disturbance: Secondary | ICD-10-CM | POA: Diagnosis present

## 2014-10-21 DIAGNOSIS — J449 Chronic obstructive pulmonary disease, unspecified: Secondary | ICD-10-CM | POA: Diagnosis present

## 2014-10-21 DIAGNOSIS — K5900 Constipation, unspecified: Secondary | ICD-10-CM | POA: Diagnosis present

## 2014-10-21 DIAGNOSIS — I5030 Unspecified diastolic (congestive) heart failure: Secondary | ICD-10-CM | POA: Insufficient documentation

## 2014-10-21 HISTORY — DX: Pneumonia, unspecified organism: J18.9

## 2014-10-21 LAB — COMPREHENSIVE METABOLIC PANEL
ALBUMIN: 2.9 g/dL — AB (ref 3.5–5.2)
ALT: 15 U/L (ref 0–53)
ANION GAP: 7 (ref 5–15)
AST: 18 U/L (ref 0–37)
Alkaline Phosphatase: 58 U/L (ref 39–117)
BILIRUBIN TOTAL: 1 mg/dL (ref 0.3–1.2)
BUN: 13 mg/dL (ref 6–23)
CALCIUM: 8.2 mg/dL — AB (ref 8.4–10.5)
CO2: 29 mmol/L (ref 19–32)
CREATININE: 0.98 mg/dL (ref 0.50–1.35)
Chloride: 102 mmol/L (ref 96–112)
GFR calc Af Amer: 86 mL/min — ABNORMAL LOW (ref >90)
GFR calc non Af Amer: 74 mL/min — ABNORMAL LOW (ref >90)
Glucose, Bld: 147 mg/dL — ABNORMAL HIGH (ref 70–99)
Potassium: 3.6 mmol/L (ref 3.5–5.1)
Sodium: 138 mmol/L (ref 135–145)
TOTAL PROTEIN: 6.7 g/dL (ref 6.0–8.3)

## 2014-10-21 LAB — CBC WITH DIFFERENTIAL/PLATELET
Basophils Absolute: 0 10*3/uL (ref 0.0–0.1)
Basophils Relative: 0 % (ref 0–1)
EOS PCT: 0 % (ref 0–5)
Eosinophils Absolute: 0 10*3/uL (ref 0.0–0.7)
HCT: 36.5 % — ABNORMAL LOW (ref 39.0–52.0)
Hemoglobin: 12.3 g/dL — ABNORMAL LOW (ref 13.0–17.0)
LYMPHS ABS: 1.2 10*3/uL (ref 0.7–4.0)
Lymphocytes Relative: 11 % — ABNORMAL LOW (ref 12–46)
MCH: 31.7 pg (ref 26.0–34.0)
MCHC: 33.7 g/dL (ref 30.0–36.0)
MCV: 94.1 fL (ref 78.0–100.0)
MONOS PCT: 12 % (ref 3–12)
Monocytes Absolute: 1.3 10*3/uL — ABNORMAL HIGH (ref 0.1–1.0)
NEUTROS PCT: 77 % (ref 43–77)
Neutro Abs: 8.8 10*3/uL — ABNORMAL HIGH (ref 1.7–7.7)
PLATELETS: 207 10*3/uL (ref 150–400)
RBC: 3.88 MIL/uL — ABNORMAL LOW (ref 4.22–5.81)
RDW: 15 % (ref 11.5–15.5)
WBC: 11.3 10*3/uL — AB (ref 4.0–10.5)

## 2014-10-21 LAB — TROPONIN I: Troponin I: 0.03 ng/mL (ref <0.031)

## 2014-10-21 LAB — BRAIN NATRIURETIC PEPTIDE: B Natriuretic Peptide: 201 pg/mL — ABNORMAL HIGH (ref 0.0–100.0)

## 2014-10-21 LAB — I-STAT CG4 LACTIC ACID, ED: Lactic Acid, Venous: 1.65 mmol/L (ref 0.5–2.0)

## 2014-10-21 MED ORDER — SODIUM CHLORIDE 0.9 % IJ SOLN
3.0000 mL | Freq: Two times a day (BID) | INTRAMUSCULAR | Status: DC
  Administered 2014-10-22 – 2014-10-24 (×5): 3 mL via INTRAVENOUS

## 2014-10-21 MED ORDER — ONDANSETRON HCL 4 MG PO TABS: 4.0000 mg | ORAL_TABLET | Freq: Four times a day (QID) | ORAL | Status: DC | PRN

## 2014-10-21 MED ORDER — SODIUM CHLORIDE 0.9 % IJ SOLN
3.0000 mL | INTRAMUSCULAR | Status: DC | PRN
  Administered 2014-10-22: 3 mL via INTRAVENOUS
  Filled 2014-10-21: qty 3

## 2014-10-21 MED ORDER — ASPIRIN EC 81 MG PO TBEC
81.0000 mg | DELAYED_RELEASE_TABLET | Freq: Every morning | ORAL | Status: DC
  Administered 2014-10-22 – 2014-10-25 (×4): 81 mg via ORAL
  Filled 2014-10-21 (×5): qty 1

## 2014-10-21 MED ORDER — PANTOPRAZOLE SODIUM 40 MG PO TBEC
40.0000 mg | DELAYED_RELEASE_TABLET | Freq: Every day | ORAL | Status: DC
  Administered 2014-10-22 – 2014-10-25 (×4): 40 mg via ORAL
  Filled 2014-10-21 (×5): qty 1

## 2014-10-21 MED ORDER — DEXTROSE 5 % IV SOLN
2.0000 g | INTRAVENOUS | Status: DC
  Filled 2014-10-21: qty 2

## 2014-10-21 MED ORDER — ONDANSETRON HCL 4 MG/2ML IJ SOLN: 4.0000 mg | Freq: Four times a day (QID) | INTRAMUSCULAR | Status: DC | PRN

## 2014-10-21 MED ORDER — IPRATROPIUM-ALBUTEROL 0.5-2.5 (3) MG/3ML IN SOLN
3.0000 mL | Freq: Four times a day (QID) | RESPIRATORY_TRACT | Status: DC
  Administered 2014-10-21 – 2014-10-25 (×13): 3 mL via RESPIRATORY_TRACT
  Filled 2014-10-21 (×13): qty 3

## 2014-10-21 MED ORDER — VANCOMYCIN HCL 500 MG IV SOLR
500.0000 mg | Freq: Two times a day (BID) | INTRAVENOUS | Status: DC
  Administered 2014-10-22 – 2014-10-24 (×6): 500 mg via INTRAVENOUS
  Filled 2014-10-21 (×7): qty 500

## 2014-10-21 MED ORDER — VANCOMYCIN HCL IN DEXTROSE 1-5 GM/200ML-% IV SOLN
1000.0000 mg | Freq: Once | INTRAVENOUS | Status: AC
Start: 2014-10-21 — End: 2014-10-21
  Administered 2014-10-21: 1000 mg via INTRAVENOUS
  Filled 2014-10-21: qty 200

## 2014-10-21 MED ORDER — ACETAMINOPHEN 325 MG PO TABS: 650.0000 mg | ORAL_TABLET | Freq: Four times a day (QID) | ORAL | Status: DC | PRN

## 2014-10-21 MED ORDER — FUROSEMIDE 10 MG/ML IJ SOLN
20.0000 mg | Freq: Two times a day (BID) | INTRAMUSCULAR | Status: DC
  Administered 2014-10-22: 20 mg via INTRAVENOUS
  Filled 2014-10-21: qty 2

## 2014-10-21 MED ORDER — SODIUM CHLORIDE 0.9 % IV SOLN
1000.0000 mL | INTRAVENOUS | Status: DC
  Administered 2014-10-21: 1000 mL via INTRAVENOUS

## 2014-10-21 MED ORDER — SODIUM CHLORIDE 0.9 % IJ SOLN
3.0000 mL | Freq: Two times a day (BID) | INTRAMUSCULAR | Status: DC
  Administered 2014-10-22 – 2014-10-24 (×6): 3 mL via INTRAVENOUS

## 2014-10-21 MED ORDER — GUAIFENESIN 100 MG/5ML PO SOLN
150.0000 mg | ORAL | Status: DC | PRN
  Filled 2014-10-21: qty 10

## 2014-10-21 MED ORDER — ALBUTEROL SULFATE (2.5 MG/3ML) 0.083% IN NEBU
2.5000 mg | INHALATION_SOLUTION | Freq: Four times a day (QID) | RESPIRATORY_TRACT | Status: DC
Start: 2014-10-21 — End: 2014-10-21

## 2014-10-21 MED ORDER — SODIUM CHLORIDE 0.9 % IV SOLN
250.0000 mL | INTRAVENOUS | Status: DC | PRN
  Administered 2014-10-22: 250 mL via INTRAVENOUS

## 2014-10-21 MED ORDER — ACETAMINOPHEN 650 MG RE SUPP: 650.0000 mg | Freq: Four times a day (QID) | RECTAL | Status: DC | PRN

## 2014-10-21 MED ORDER — DEXTROSE 5 % IV SOLN
2.0000 g | Freq: Once | INTRAVENOUS | Status: AC
  Administered 2014-10-21: 2 g via INTRAVENOUS
  Filled 2014-10-21: qty 2

## 2014-10-21 MED ORDER — FUROSEMIDE 10 MG/ML IJ SOLN
40.0000 mg | Freq: Once | INTRAMUSCULAR | Status: AC
  Administered 2014-10-21: 40 mg via INTRAVENOUS
  Filled 2014-10-21: qty 4

## 2014-10-21 MED ORDER — ALBUTEROL SULFATE (2.5 MG/3ML) 0.083% IN NEBU: 2.5000 mg | INHALATION_SOLUTION | RESPIRATORY_TRACT | Status: DC | PRN

## 2014-10-21 MED ORDER — ENOXAPARIN SODIUM 40 MG/0.4ML ~~LOC~~ SOLN
40.0000 mg | SUBCUTANEOUS | Status: DC
Start: 2014-10-21 — End: 2014-10-25
  Administered 2014-10-22 – 2014-10-24 (×4): 40 mg via SUBCUTANEOUS
  Filled 2014-10-21 (×4): qty 0.4

## 2014-10-21 MED ORDER — IPRATROPIUM BROMIDE 0.02 % IN SOLN: 0.5000 mg | Freq: Four times a day (QID) | RESPIRATORY_TRACT | Status: DC

## 2014-10-21 NOTE — H&P (Signed)
[facility]   Chief Complaint:  Lethargy  HPI:  81 yr old male who   has a past medical history of Dementia; GERD (gastroesophageal reflux disease); Anxiety; and Pneumonia.   Today  was brought to the ED for worsening lethargy and generalized weakness. Patient was [facility] yesterday after he was therefore shortness of breath. As per family patient has been sleeping most of the time, and has been weak more than usual. Patient is unable to provide any history at this time. As per family patient had a choking episode at [facility], chest x-ray today does not show pulmonary vascular congestion, with possible pneumonia.  Patient's echocardiogram showed he has grade 1 diastolic dysfunction, with EF 65-78%60-65%. BNP is mildly elevated to 201. lactic acid 1.65. CT head showed no acute abnormality.   Allergies:   Allergies  Allergen Reactions  . Onion Other (See Comments)    Indigestion      Past Medical History  Diagnosis Date  . Dementia   . GERD (gastroesophageal reflux disease)   . Anxiety   . Pneumonia     History reviewed. No pertinent past surgical history.  Prior to Admission medications   Medication Sig Start Date End Date Taking? Authorizing Provider  albuterol (PROVENTIL HFA;VENTOLIN HFA) 108 (90 BASE) MCG/ACT inhaler Inhale 2 puffs into the lungs every 6 (six) hours as needed for wheezing or shortness of breath.   Yes Historical Provider, MD  Ascorbic Acid (VITAMIN C PO) Take 500 mg by mouth daily.   Yes Historical Provider, MD  aspirin EC 81 MG tablet Take 81 mg by mouth every morning.    Yes Historical Provider, MD  guaifenesin (ROBITUSSIN) 100 MG/5ML syrup Take 150 mg by mouth every 4 (four) hours as needed for cough.   Yes Historical Provider, MD  levofloxacin (LEVAQUIN) 750 MG tablet Take 750 mg by mouth daily.   Yes Historical Provider, MD  omeprazole (PRILOSEC) 20 MG capsule Take 20  mg by mouth daily.   Yes Historical Provider, MD  predniSONE (DELTASONE) 10 MG tablet Take 10-50 mg by mouth daily with breakfast. Takes 5 tablets on day 1, 4 day 2, 3 day 3, 2 day 4, 1 day 1   Yes Historical Provider, MD  amoxicillin-clavulanate (AUGMENTIN) 875-125 MG per tablet Take 1 tablet by mouth 2 (two) times daily. Patient not taking: Reported on 10/21/2014 08/15/14   Erick BlinksJehanzeb Memon, MD    Social History:  reports that he quit smoking about 31 years ago. His smoking use included Cigarettes. He has a 10 pack-year smoking history. He has never used smokeless tobacco. He reports that he does not drink alcohol or use illicit drugs.  Family History  Problem Relation Age of Onset  . Stroke Sister      All the positives are listed in BOLD  Review of Systems:  HEENT: Headache, blurred vision, runny nose, sore throat Neck: Hypothyroidism, hyperthyroidism,,lymphadenopathy Chest : Shortness of breath, history of COPD, Asthma Heart : Chest pain, history of coronary arterey disease GI:  Nausea, vomiting, diarrhea, constipation, GERD GU: Dysuria, urgency, frequency of urination, hematuria Neuro: Stroke, seizures, syncope Psych: Depression, anxiety, hallucinations   Physical Exam: Blood pressure 132/52, pulse 86, temperature 98.5 F (36.9 C), temperature source Rectal, resp. rate 25, height 5\' 6"  (1.676 m), weight 66.225 kg (146 lb), SpO2 94 %. Constitutional:   Patient is a well-developed and well-nourished male in no acute distress and  cooperative with exam. Head: Normocephalic and atraumatic Mouth: Mucus membranes moist Eyes: PERRL, EOMI, conjunctivae normal Neck: Supple, No Thyromegaly Cardiovascular: RRR, S1 normal, S2 normal Pulmonary/Chest: Bilateral rhonchi Abdominal: Soft. Non-tender, non-distended, bowel sounds are normal, no masses, organomegaly, or guarding present.  Neurological: Somnolent , opens eyes to verbal stimuli, moving all extremities Extremities : No Cyanosis,  Clubbing or Edema  Labs on Admission:  Basic Metabolic Panel:  Recent Labs Lab 10/21/14 1756  NA 138  K 3.6  CL 102  CO2 29  GLUCOSE 147*  BUN 13  CREATININE 0.98  CALCIUM 8.2*   Liver Function Tests:  Recent Labs Lab 10/21/14 1756  AST 18  ALT 15  ALKPHOS 58  BILITOT 1.0  PROT 6.7  ALBUMIN 2.9*   No results for input(s): LIPASE, AMYLASE in the last 168 hours. No results for input(s): AMMONIA in the last 168 hours. CBC:  Recent Labs Lab 10/21/14 1756  WBC 11.3*  NEUTROABS 8.8*  HGB 12.3*  HCT 36.5*  MCV 94.1  PLT 207   Cardiac Enzymes:  Recent Labs Lab 10/21/14 1756  TROPONINI 0.03    BNP (last 3 results)  Recent Labs  10/21/14 1758  BNP 201.0*    ProBNP (last 3 results) No results for input(s): PROBNP in the last 8760 hours.  CBG: No results for input(s): GLUCAP in the last 168 hours.  Radiological Exams on Admission: Ct Head Wo Contrast  10/21/2014   CLINICAL DATA:  history of Alzheimer's and dementia who presents to the Emergency Department complaining of difficulty swallowing both solids and liquids with onset last night/early this morning. Relative states she also noticed around 0700 today some generalized weakness, left eye drooping, and decreased talking. She states he normally has no difficulty eating, is talkative though confused, and is ambulatory without assistance. Family notes he was discharged from [facility] yesterday, where he was hospitalized for 2 days for sepsis possibly due to pneumonia.  EXAM: CT HEAD WITHOUT CONTRAST  TECHNIQUE: Contiguous axial images were obtained from the base of the skull through the vertex without intravenous contrast.  COMPARISON:  08/11/2014  FINDINGS: Ventricles and cisterns are within normal. There is mild age related atrophic change as well as chronic ischemic microvascular disease. There is no mass, mass effect, shift of midline structures or acute hemorrhage. No evidence to suggest acute  infarction. Remaining bones and soft tissues are unchanged.  IMPRESSION: No acute intracranial findings.  Mild chronic ischemic microvascular disease with age related atrophic change.   Electronically Signed   By: Elberta Fortis M.D.   On: 10/21/2014 19:19   Dg Chest Port 1 View  10/21/2014   CLINICAL DATA:  Status post choking episode; unresponsive. Initial encounter.  EXAM: PORTABLE CHEST - 1 VIEW  COMPARISON:  Chest radiograph performed 08/12/2014  FINDINGS: The lungs are mildly hypoexpanded. Vascular congestion is noted, with increased interstitial markings, concerning for mild recurrent interstitial edema. No pleural effusion or pneumothorax is seen.  The cardiomediastinal silhouette is borderline normal in size. No acute osseous abnormalities are identified.  IMPRESSION: Lungs mildly hypoexpanded. Vascular congestion, with increased interstitial markings, concerning for mild interstitial edema. Pneumonia could conceivably have a similar appearance.   Electronically Signed   By: Roanna Raider M.D.   On: 10/21/2014 18:45    EKG: Independently reviewed sinus rhythm    Assessment/Plan Active Problems:   Acute dyspnea   HCAP (healthcare-associated pneumonia)   Acute diastolic heart failure   Acute diastolic heart failure- patient presenting with bilateral  rhonchi, mild elevation of BNP. Will give 1 dose of Lasix in the ED and start Lasix 20 g IV every 12 hours. Chest x-ray shows pulmonary vascular congestion increased interstitial markings concerning for mild interstitial edema. Will put Foley catheter to gravity for strict I's and O's.  Healthcare associated pneumonia- patient presenting with generalized weakness, with question of aspiration. X-ray did not show any infiltrate but says pneumonia still could be a possibility. We'll start the patient on vancomycin and cefepime per pharmacy consultation. The antibiotics can be changed to by mouth antibiotics if patient improves with IV  Lasix.  Dementia- patient has dementia at baseline, no behavioral disturbance noted at this time.\  DVT prophylaxis- Lovenox  Code status: patient is DO NOT RESUSCITATE   Family discussion: Admission, patients condition and plan of care including tests being ordered have been discussed with the  patient's son and daughter-in-law at bedside* who indicate understanding and agree with the plan and Code Status.   Time Spent on Admission 60 minutes   LAMA,GAGAN S [facility] Pager: [phone] 10/21/2014, 7:56 PM  If 7PM-7AM, please contact night-coverage  [email or web address]  Password [email or web address]

## 2014-10-21 NOTE — ED Provider Notes (Signed)
CSN: [id number]     Arrival date & time 10/21/14  1710 History  This chart was scribed for Blane OharaJoshua Caley Volkert, MD by Tonye RoyaltyJoshua Chen, ED Scribe. This patient was seen in room APA06/APA06 and the patient's care was started at 5:15 PM.    LEVEL V CAVEAT: Dementia and Confusion  Chief Complaint  Patient presents with  . Aphasia  . Fatigue   The history is provided by the patient and a relative. The history is limited by the condition of the patient. No language interpreter was used.    HPI Comments: Chad Everett is a 81 y.o. male with history of Alzheimer's and dementia who presents to the Emergency Department complaining of difficulty swallowing both solids and liquids with onset last night/early this morning. Relative states she also noticed around 0700 today some generalized weakness, left eye drooping, and decreased talking. She states he normally has no difficulty eating, is talkative though confused, and is ambulatory without assistance. Family notes he was discharged from [facility] yesterday, where he was hospitalized for 2 days for sepsis possibly due to pneumonia. Family states he does not have any history of cardiac or lung problems. They states workup at [facility] revealed evidence of past TIAs. Family denies diarrhea, vomiting, or fever.  Past Medical History  Diagnosis Date  . Dementia   . GERD (gastroesophageal reflux disease)   . Anxiety   . Pneumonia    History reviewed. No pertinent past surgical history. Family History  Problem Relation Age of Onset  . Stroke Sister    History  Substance Use Topics  . Smoking status: Former Smoker -- 0.50 packs/day for 20 years    Types: Cigarettes    Quit date: 08/12/1983  . Smokeless tobacco: Never Used  . Alcohol Use: No    Review of Systems  Unable to perform ROS: Dementia  Constitutional: Negative for fever.  HENT: Positive for trouble swallowing.   Gastrointestinal: Negative for nausea, vomiting and diarrhea.  Neurological:  Positive for facial asymmetry, speech difficulty and weakness.  Psychiatric/Behavioral: Positive for confusion.      Allergies  Onion  Home Medications   Prior to Admission medications   Medication Sig Start Date End Date Taking? Authorizing Provider  albuterol (PROVENTIL HFA;VENTOLIN HFA) 108 (90 BASE) MCG/ACT inhaler Inhale 2 puffs into the lungs every 6 (six) hours as needed for wheezing or shortness of breath.   Yes Historical Provider, MD  Ascorbic Acid (VITAMIN C PO) Take 500 mg by mouth daily.   Yes Historical Provider, MD  aspirin EC 81 MG tablet Take 81 mg by mouth every morning.    Yes Historical Provider, MD  guaifenesin (ROBITUSSIN) 100 MG/5ML syrup Take 150 mg by mouth every 4 (four) hours as needed for cough.   Yes Historical Provider, MD  levofloxacin (LEVAQUIN) 750 MG tablet Take 750 mg by mouth daily.   Yes Historical Provider, MD  omeprazole (PRILOSEC) 20 MG capsule Take 20 mg by mouth daily.   Yes Historical Provider, MD  predniSONE (DELTASONE) 10 MG tablet Take 10-50 mg by mouth daily with breakfast. Takes 5 tablets on day 1, 4 day 2, 3 day 3, 2 day 4, 1 day 1   Yes Historical Provider, MD  amoxicillin-clavulanate (AUGMENTIN) 875-125 MG per tablet Take 1 tablet by mouth 2 (two) times daily. Patient not taking: Reported on 10/21/2014 08/15/14   Erick BlinksJehanzeb Memon, MD   BP 132/52 mmHg  Pulse 86  Temp(Src) 98.5 F (36.9 C) (Rectal)  Resp 25  Ht   (1.676 m)  Wt 146 lb (66.225 kg)  BMI 23.58 kg/m2  SpO2 94% Physical Exam  Constitutional: He is oriented to person, place, and time. He appears well-developed and well-nourished.  HENT:  Head: Normocephalic and atraumatic.  Mildly dry mucous membranes  Eyes: Conjunctivae are normal.  Neck: Normal range of motion. Neck supple.  Cardiovascular: Regular rhythm.  Tachycardia present.   Pulmonary/Chest: Effort normal. Tachypnea noted. He has wheezes. He has rales.  Increased effort to breathe Wheeze bilaterally Rales  bilaterally Mild abdominal breathing  Abdominal: He exhibits no distension. There is no tenderness.  No signs of abdominal tenderness or distension  Musculoskeletal: Normal range of motion.  No significant swelling in legs  Neurological: He is alert and oriented to person, place, and time. GCS eye subscore is 4. GCS verbal subscore is 3. GCS motor subscore is 6.  general weakness bilateral, no arm drift, no facial droop Equal 4/5 strength in upper extremities bilateral Difficult to discern speech  Skin: Skin is warm and dry.  Psychiatric:  Confused  Nursing note and vitals reviewed.   ED Course  Procedures (including critical care time)  DIAGNOSTIC STUDIES: Oxygen Saturation is 92% on room air, adequate by my interpretation.    COORDINATION OF CARE: 5:25 PM Discussed treatment plan with patient at beside, the patient agrees with the plan and has no further questions at this time.   Labs Review Labs Reviewed  CBC WITH DIFFERENTIAL/PLATELET - Abnormal; Notable for the following:    WBC 11.3 (*)    RBC 3.88 (*)    Hemoglobin 12.3 (*)    HCT 36.5 (*)    Neutro Abs 8.8 (*)    Lymphocytes Relative 11 (*)    Monocytes Absolute 1.3 (*)    All other components within normal limits  COMPREHENSIVE METABOLIC PANEL - Abnormal; Notable for the following:    Glucose, Bld 147 (*)    Calcium 8.2 (*)    Albumin 2.9 (*)    GFR calc non Af Amer 74 (*)    GFR calc Af Amer 86 (*)    All other components within normal limits  BRAIN NATRIURETIC PEPTIDE - Abnormal; Notable for the following:    B Natriuretic Peptide 201.0 (*)    All other components within normal limits  CULTURE, BLOOD (ROUTINE X 2)  CULTURE, BLOOD (ROUTINE X 2)  URINE CULTURE  TROPONIN I  URINALYSIS, ROUTINE W REFLEX MICROSCOPIC  I-STAT CG4 LACTIC ACID, ED    Imaging Review Ct Head Wo Contrast  10/21/2014   CLINICAL DATA:  history of Alzheimer's and dementia who presents to the Emergency Department complaining of  difficulty swallowing both solids and liquids with onset last night/early this morning. Relative states she also noticed around 0700 today some generalized weakness, left eye drooping, and decreased talking. She states he normally has no difficulty eating, is talkative though confused, and is ambulatory without assistance. Family notes he was discharged from [facility] yesterday, where he was hospitalized for 2 days for sepsis possibly due to pneumonia.  EXAM: CT HEAD WITHOUT CONTRAST  TECHNIQUE: Contiguous axial images were obtained from the base of the skull through the vertex without intravenous contrast.  COMPARISON:  08/11/2014  FINDINGS: Ventricles and cisterns are within normal. There is mild age related atrophic change as well as chronic ischemic microvascular disease. There is no mass, mass effect, shift of midline structures or acute hemorrhage. No evidence to suggest acute infarction. Remaining bones and soft tissues are unchanged.  IMPRESSION: No acute intracranial findings.  Mild chronic ischemic microvascular disease with age related atrophic change.   Electronically Signed   By: Elberta Fortis M.D.   On: 10/21/2014 19:19   Dg Chest Port 1 View  10/21/2014   CLINICAL DATA:  Status post choking episode; unresponsive. Initial encounter.  EXAM: PORTABLE CHEST - 1 VIEW  COMPARISON:  Chest radiograph performed 08/12/2014  FINDINGS: The lungs are mildly hypoexpanded. Vascular congestion is noted, with increased interstitial markings, concerning for mild recurrent interstitial edema. No pleural effusion or pneumothorax is seen.  The cardiomediastinal silhouette is borderline normal in size. No acute osseous abnormalities are identified.  IMPRESSION: Lungs mildly hypoexpanded. Vascular congestion, with increased interstitial markings, concerning for mild interstitial edema. Pneumonia could conceivably have a similar appearance.   Electronically Signed   By: Roanna Raider M.D.   On: 10/21/2014 18:45      EKG Interpretation   Date/Time:  Saturday October 21 2014 17:20:32 EST Ventricular Rate:  78 PR Interval:  148 QRS Duration: 69 QT Interval:  369 QTC Calculation: 420 R Axis:   75 Text Interpretation:  Sinus rhythm Nonspecific ST depression Confirmed by  Gracianna Vink  MD, Malike Foglio ([id number]) on 10/21/2014 5:59:03 PM      MDM   Final diagnoses:  Encephalopathy  Acute dyspnea  HCAP (healthcare-associated pneumonia)  Hypoxia   I personally performed the services described in this documentation, which was scribed in my presence. The recorded information has been reviewed and is accurate.  Patient presents with worsening confusion, breathing difficulty, cough and unable to tolerate by mouth/difficulty swallowing and choking episodes.family's been told he had small strokes on imaging. The symptoms have been going on since yesterday and no other focal findings, general weak on exam.  Initial concern for aspiration pneumonia from possible stroke yesterday. CT scan results reviewed no acute findings. Patient acquiring 1 L nasal cannula. Brought antibiotics given for possible Hcap, pulmonary congestion/pulm edema also in the differential.  Patient is out of any window for TPA.  Discussed with hospitalist for admission, trial of Lasix recommended.  The patients results and plan were reviewed and discussed.   Any x-rays performed were personally reviewed by myself.   Differential diagnosis were considered with the presenting HPI.  Medications  0.9 %  sodium chloride infusion (0 mLs Intravenous Stopped 10/21/14 1848)  vancomycin (VANCOCIN) IVPB 1000 mg/200 mL premix (1,000 mg Intravenous New Bag/Given 10/21/14 1848)  furosemide (LASIX) injection 40 mg (not administered)  ceFEPIme (MAXIPIME) 2 g in dextrose 5 % 50 mL IVPB (2 g Intravenous New Bag/Given 10/21/14 1848)    Filed Vitals:   10/21/14 1725 10/21/14 1736 10/21/14 1800 10/21/14 1849  BP: 136/70 125/60 131/63 132/52  Pulse: 95 93 81 86   Temp:  98.5 F (36.9 C)    TempSrc:  Rectal    Resp: Height:  (1.676 m)     Weight: 146 lb (66.225 kg)     SpO2: 92% 91% 95% 94%    Final diagnoses:  Encephalopathy  Acute dyspnea  HCAP (healthcare-associated pneumonia)  Hypoxia    Admission/ observation were discussed with the admitting physician, patient and/or family and they are comfortable with the plan.    Blane Ohara, MD 10/21/14 [id number]

## 2014-10-21 NOTE — ED Notes (Signed)
Per family patient admitted to [facility] and discharged yesterday. Patient was admitted for possible sepsis from pneumonia with temp of 105. Patient started having difficulty swallowing yesterday. Speech Therapist evaluated patient and family was told dysphagia because he got choked on eggs and they had to perform the heimlich and that patient should recover shortly. Patient discharged yesterday. Per family patient now not talking as much and has generalized weakness and left facial droop. Patient normally very talkative.

## 2014-10-21 NOTE — Progress Notes (Signed)
Pharmacy Note:  Initial antibiotics for Vancomycin and Cefepime ordered by EDP for HCAP.  CrCl cannot be calculated (Patient has no serum creatinine result on file.).   Allergies  Allergen Reactions  . Onion Other (See Comments)    Indigestion    Filed Vitals:   10/21/14 1736  BP: 125/60  Pulse: 93  Temp: 98.5 F (36.9 C)  Resp: 24    Anti-infectives    Start     Dose/Rate Route Frequency Ordered Stop   10/21/14 1800  ceFEPIme (MAXIPIME) 2 g in dextrose 5 % 50 mL IVPB     2 g 100 mL/hr over 30 Minutes Intravenous  Once 10/21/14 1748     10/21/14 1800  vancomycin (VANCOCIN) IVPB 1000 mg/200 mL premix     1,000 mg 200 mL/hr over 60 Minutes Intravenous  Once 10/21/14 1748        Plan: Initial doses of Vancomycin 1gm and Cefepime 2gm X 1 ordered. F/U admission orders for further dosing if therapy continued.  Mady GemmaHayes, Micheal Sheen R, [facility] 10/21/2014 5:49 PM

## 2014-10-21 NOTE — Progress Notes (Signed)
ANTIBIOTIC CONSULT NOTE  Pharmacy Consult for Vancomycin and Cefepime  Indication: pneumonia  Allergies  Allergen Reactions  . Onion Other (See Comments)    Indigestion   Patient Measurements: Height: 5\' 6"  (167.6 cm) Weight: 146 lb (66.225 kg) IBW/kg (Calculated) : 63.8  Vital Signs: Temp: 98.5 F (36.9 C) (03/12 1736) Temp Source: Rectal (03/12 1736) BP: 100/62 mmHg (03/12 2020) Pulse Rate: 70 (03/12 2020) Intake/Output from previous day:   Intake/Output from this shift:    Labs:  Recent Labs  10/21/14 1756  WBC 11.3*  HGB 12.3*  PLT 207  CREATININE 0.98   Estimated Creatinine Clearance: 52.4 mL/min (by C-G formula based on Cr of 0.98). No results for input(s): VANCOTROUGH, VANCOPEAK, VANCORANDOM, GENTTROUGH, GENTPEAK, GENTRANDOM, TOBRATROUGH, TOBRAPEAK, TOBRARND, AMIKACINPEAK, AMIKACINTROU, AMIKACIN in the last 72 hours.   Microbiology: Recent Results (from the past 720 hour(s))  Blood Culture (routine x 2)     Status: None (Preliminary result)   Collection Time: 10/21/14  5:53 PM  Result Value Ref Range Status   Specimen Description LEFT ANTECUBITAL  Final   Special Requests BOTTLES DRAWN AEROBIC AND ANAEROBIC [facility]7CC  Final   Culture PENDING  Incomplete   Report Status PENDING  Incomplete  Blood Culture (routine x 2)     Status: None (Preliminary result)   Collection Time: 10/21/14  5:58 PM  Result Value Ref Range Status   Specimen Description RIGHT ANTECUBITAL  Final   Special Requests BOTTLES DRAWN AEROBIC AND ANAEROBIC 6CC  Final   Culture PENDING  Incomplete   Report Status PENDING  Incomplete    Anti-infectives    Start     Dose/Rate Route Frequency Ordered Stop   10/21/14 1800  ceFEPIme (MAXIPIME) 2 g in dextrose 5 % 50 mL IVPB     2 g 100 mL/hr over 30 Minutes Intravenous  Once 10/21/14 1748 10/21/14 2008   10/21/14 1800  vancomycin (VANCOCIN) IVPB 1000 mg/200 mL premix     1,000 mg 200 mL/hr over 60 Minutes Intravenous  Once 10/21/14 1748  10/21/14 2008      Assessment: Okay for Protocol, ABX for HCAP, initial doses given in ED>  Goal of Therapy:  Vancomycin trough level 15-20 mcg/ml  Eradicate infection.   Plan:  Vancomycin 500mg  IV every 12 hours Measure antibiotic drug levels at steady state Follow up culture results  Cefepime 2gm IV every 24 hours.  Mady GemmaHayes, Celeste Candelas R 10/21/2014,8:22 PM

## 2014-10-22 DIAGNOSIS — J9601 Acute respiratory failure with hypoxia: Secondary | ICD-10-CM

## 2014-10-22 DIAGNOSIS — G9340 Encephalopathy, unspecified: Secondary | ICD-10-CM | POA: Diagnosis present

## 2014-10-22 DIAGNOSIS — J690 Pneumonitis due to inhalation of food and vomit: Principal | ICD-10-CM

## 2014-10-22 DIAGNOSIS — F0390 Unspecified dementia without behavioral disturbance: Secondary | ICD-10-CM

## 2014-10-22 DIAGNOSIS — E876 Hypokalemia: Secondary | ICD-10-CM

## 2014-10-22 DIAGNOSIS — J449 Chronic obstructive pulmonary disease, unspecified: Secondary | ICD-10-CM

## 2014-10-22 LAB — COMPREHENSIVE METABOLIC PANEL
ALT: 14 U/L (ref 0–53)
ANION GAP: 7 (ref 5–15)
AST: 17 U/L (ref 0–37)
Albumin: 2.6 g/dL — ABNORMAL LOW (ref 3.5–5.2)
Alkaline Phosphatase: 59 U/L (ref 39–117)
BUN: 15 mg/dL (ref 6–23)
CHLORIDE: 102 mmol/L (ref 96–112)
CO2: 31 mmol/L (ref 19–32)
Calcium: 7.9 mg/dL — ABNORMAL LOW (ref 8.4–10.5)
Creatinine, Ser: 0.78 mg/dL (ref 0.50–1.35)
GFR calc Af Amer: >90 mL/min (ref >90)
GFR calc non Af Amer: 82 mL/min — ABNORMAL LOW (ref >90)
GLUCOSE: 121 mg/dL — AB (ref 70–99)
POTASSIUM: 2.9 mmol/L — AB (ref 3.5–5.1)
SODIUM: 140 mmol/L (ref 135–145)
TOTAL PROTEIN: 6.6 g/dL (ref 6.0–8.3)
Total Bilirubin: 1.1 mg/dL (ref 0.3–1.2)

## 2014-10-22 LAB — CBC
HCT: 35.4 % — ABNORMAL LOW (ref 39.0–52.0)
HEMOGLOBIN: 11.6 g/dL — AB (ref 13.0–17.0)
MCH: 30.9 pg (ref 26.0–34.0)
MCHC: 32.8 g/dL (ref 30.0–36.0)
MCV: 94.4 fL (ref 78.0–100.0)
PLATELETS: 207 10*3/uL (ref 150–400)
RBC: 3.75 MIL/uL — ABNORMAL LOW (ref 4.22–5.81)
RDW: 15 % (ref 11.5–15.5)
WBC: 10 10*3/uL (ref 4.0–10.5)

## 2014-10-22 LAB — URINE MICROSCOPIC-ADD ON

## 2014-10-22 LAB — BLOOD GAS, ARTERIAL
Acid-Base Excess: 6.8 mmol/L — ABNORMAL HIGH (ref 0.0–2.0)
Bicarbonate: 30.5 mEq/L — ABNORMAL HIGH (ref 20.0–24.0)
DRAWN BY: 23588
O2 CONTENT: 2 L/min
O2 Saturation: 98.4 %
PCO2 ART: 40.7 mmHg (ref 35.0–45.0)
PH ART: 7.487 — AB (ref 7.350–7.450)
PO2 ART: 124 mmHg — AB (ref 80.0–100.0)
Patient temperature: 37
TCO2: 26.1 mmol/L (ref 0–100)

## 2014-10-22 LAB — TSH: TSH: 0.257 u[IU]/mL — AB (ref 0.350–4.500)

## 2014-10-22 LAB — URINALYSIS, ROUTINE W REFLEX MICROSCOPIC
Bilirubin Urine: NEGATIVE
Glucose, UA: NEGATIVE mg/dL
Ketones, ur: NEGATIVE mg/dL
LEUKOCYTES UA: NEGATIVE
NITRITE: NEGATIVE
PH: 7 (ref 5.0–8.0)
Protein, ur: 30 mg/dL — AB
Specific Gravity, Urine: 1.015 (ref 1.005–1.030)
Urobilinogen, UA: 0.2 mg/dL (ref 0.0–1.0)

## 2014-10-22 LAB — MAGNESIUM: Magnesium: 1.9 mg/dL (ref 1.5–2.5)

## 2014-10-22 LAB — AMMONIA: AMMONIA: 35 umol/L — AB (ref 11–32)

## 2014-10-22 MED ORDER — POTASSIUM CHLORIDE 10 MEQ/100ML IV SOLN
10.0000 meq | INTRAVENOUS | Status: AC
  Administered 2014-10-22 (×4): 10 meq via INTRAVENOUS
  Filled 2014-10-22 (×4): qty 100

## 2014-10-22 MED ORDER — PIPERACILLIN-TAZOBACTAM 3.375 G IVPB
3.3750 g | Freq: Three times a day (TID) | INTRAVENOUS | Status: DC
  Administered 2014-10-22 – 2014-10-25 (×10): 3.375 g via INTRAVENOUS
  Filled 2014-10-22 (×13): qty 50

## 2014-10-22 MED ORDER — VANCOMYCIN HCL 500 MG IV SOLR
INTRAVENOUS | Status: AC
  Filled 2014-10-22: qty 500

## 2014-10-22 MED ORDER — SODIUM CHLORIDE 0.9 % IV SOLN
INTRAVENOUS | Status: DC
  Administered 2014-10-22: 14:00:00 via INTRAVENOUS
  Administered 2014-10-24: 1 mL via INTRAVENOUS

## 2014-10-22 NOTE — Progress Notes (Signed)
ANTIBIOTIC CONSULT NOTE  Pharmacy Consult for Vancomycin and Zosyn Indication: pneumonia  Allergies  Allergen Reactions  . Onion Other (See Comments)    Indigestion   Patient Measurements: Height: 5\' 6"  (167.6 cm) Weight: 143 lb 14.4 oz (65.273 kg) IBW/kg (Calculated) : 63.8  Vital Signs: Temp: 99.2 F (37.3 C) (03/13 0558) Temp Source: Oral (03/13 0558) BP: 102/58 mmHg (03/13 0558) Pulse Rate: 84 (03/13 1031) Intake/Output from previous day: 03/12 0701 - 03/13 0700 In: -  Out: 300 [Urine:300] Intake/Output from this shift: Total I/O In: -  Out: 650 [Urine:650]  Labs:  Recent Labs  10/21/14 1756 10/22/14 0708  WBC 11.3* 10.0  HGB 12.3* 11.6*  PLT 207 207  CREATININE 0.98 0.78   Estimated Creatinine Clearance: 64.2 mL/min (by C-G formula based on Cr of 0.78). No results for input(s): VANCOTROUGH, VANCOPEAK, VANCORANDOM, GENTTROUGH, GENTPEAK, GENTRANDOM, TOBRATROUGH, TOBRAPEAK, TOBRARND, AMIKACINPEAK, AMIKACINTROU, AMIKACIN in the last 72 hours.   Microbiology: Recent Results (from the past 720 hour(s))  Blood Culture (routine x 2)     Status: None (Preliminary result)   Collection Time: 10/21/14  5:53 PM  Result Value Ref Range Status   Specimen Description LEFT ANTECUBITAL  Final   Special Requests BOTTLES DRAWN AEROBIC AND ANAEROBIC 7CC  Final   Culture NO GROWTH 1 DAY  Final   Report Status PENDING  Incomplete  Blood Culture (routine x 2)     Status: None (Preliminary result)   Collection Time: 10/21/14  5:58 PM  Result Value Ref Range Status   Specimen Description RIGHT ANTECUBITAL  Final   Special Requests BOTTLES DRAWN AEROBIC AND ANAEROBIC 6CC  Final   Culture NO GROWTH 1 DAY  Final   Report Status PENDING  Incomplete    Anti-infectives    Start     Dose/Rate Route Frequency Ordered Stop   10/22/14 1800  ceFEPIme (MAXIPIME) 2 g in dextrose 5 % 50 mL IVPB  Status:  Discontinued     2 g 100 mL/hr over 30 Minutes Intravenous Every 24 hours 10/21/14  2051 10/22/14 1318   10/22/14 0600  vancomycin (VANCOCIN) 500 mg in sodium chloride 0.9 % 100 mL IVPB     500 mg 100 mL/hr over 60 Minutes Intravenous Every 12 hours 10/21/14 2051     10/21/14 1800  ceFEPIme (MAXIPIME) 2 g in dextrose 5 % 50 mL IVPB     2 g 100 mL/hr over 30 Minutes Intravenous  Once 10/21/14 1748 10/21/14 2008   10/21/14 1800  vancomycin (VANCOCIN) IVPB 1000 mg/200 mL premix     1,000 mg 200 mL/hr over 60 Minutes Intravenous  Once 10/21/14 1748 10/21/14 2008      Assessment: Okay for Protocol, ABX for HCAP, initial doses given in ED>  Goal of Therapy:  Vancomycin trough level 15-20 mcg/ml  Eradicate infection.   Plan:  Vancomycin 500mg  IV every 12 hours Measure antibiotic drug levels at steady state Follow up culture results  Zosyn 3.375gm IV every 8 hours. Follow-up micro data, labs, vitals.  Mady GemmaHayes, Deetta Siegmann R 10/22/2014,1:48 PM

## 2014-10-22 NOTE — Progress Notes (Signed)
[facility] PROGRESS NOTE  Chad Everett ZOX:[id number] DOB: 1931/06/28 DOA: 10/21/2014 PCP: [facility]  Assessment/Plan: 1. Encephalopathy. Patient has known underlying dementia, but family reports that he has been increasingly somnolent over the past several days. He had difficulty walking due to weakness. His granddaughter feels that he may have had difficulty raising his right arm, although she is unsure if this was a bilateral weakness. There was question of slurring of his speech. He said difficulty swallowing over the last several days and has had several aspiration episodes. All these features may be explained by an underlying metabolic encephalopathy due to an underlying infection, but I think the patient will need an MRI of the brain to rule out any underlying pathology. He does appear to be dehydrated on exam and therefore will start him on IV fluids. Check ABG, ammonia, TSH. Urinalysis is not showing any signs of infection. He is not on any sedative medications. 2. Aspiration pneumonia. Patient had several episodes of aspiration of the last several days. Will request speech therapy to evaluate. He he was initially started on vancomycin and cefepime. Will discontinue cefepime in favor of Zosyn for anaerobic coverage. 3. Acute respiratory failure with hypoxia. Likely related to #2. Wean down oxygen as tolerated. 4. Hypokalemia. Replace 5. COPD. Appears stable at this time. No evidence of wheezing on exam. Continue bronchodilators. 6. Chronic diastolic congestive heart failure. The patient appears to be dehydrated on clinical exam. Continue to follow. 7. Dementia, chronic, stable  Code Status: DNR Family Communication: discussed with son and grand daughter Disposition Plan: pending hospital course   Consultants:    Procedures:    Antibiotics:  Vancomycin 3/12  Zosyn 3/13  HPI/Subjective: Somnolent, does not answer questions  Objective: Filed  Vitals:   10/22/14 1516  BP: 124/61  Pulse: 87  Temp: 99.1 F (37.3 C)  Resp: 20    Intake/Output Summary (Last 24 hours) at 10/22/14 1738 Last data filed at 10/22/14 1658  Gross per 24 hour  Intake    450 ml  Output   1250 ml  Net   -800 ml   Filed Weights   10/21/14 1725 10/21/14 2052  Weight: 66.225 kg (146 lb) 65.273 kg (143 lb 14.4 oz)    Exam:   General:  Somnolent, responds to voice but then falls back asleep  Cardiovascular: s1, s2, rrr  Respiratory: bilateral rhonchi  Abdomen: soft, nt, nd, bs+  Musculoskeletal: no edema b/l  Data Reviewed: Basic Metabolic Panel:  Recent Labs Lab 10/21/14 1756 10/22/14 0708 10/22/14 1422  NA 138 140  --   K 3.6 2.9*  --   CL 102 102  --   CO2 29 31  --   GLUCOSE 147* 121*  --   BUN 13 15  --   CREATININE 0.98 0.78  --   CALCIUM 8.2* 7.9*  --   MG  --   --  1.9   Liver Function Tests:  Recent Labs Lab 10/21/14 1756 10/22/14 0708  AST 18 17  ALT 15 14  ALKPHOS 58 59  BILITOT 1.0 1.1  PROT 6.7 6.6  ALBUMIN 2.9* 2.6*   No results for input(s): LIPASE, AMYLASE in the last 168 hours.  Recent Labs Lab 10/22/14 1422  AMMONIA 35*   CBC:  Recent Labs Lab 10/21/14 1756 10/22/14 0708  WBC 11.3* 10.0  NEUTROABS 8.8*  --   HGB 12.3* 11.6*  HCT 36.5* 35.4*  MCV 94.1 94.4  PLT 207 207  Cardiac Enzymes:  Recent Labs Lab 10/21/14 1756  TROPONINI 0.03   BNP (last 3 results)  Recent Labs  10/21/14 1758  BNP 201.0*    ProBNP (last 3 results) No results for input(s): PROBNP in the last 8760 hours.  CBG: No results for input(s): GLUCAP in the last 168 hours.  Recent Results (from the past 240 hour(s))  Blood Culture (routine x 2)     Status: None (Preliminary result)   Collection Time: 10/21/14  5:53 PM  Result Value Ref Range Status   Specimen Description LEFT ANTECUBITAL  Final   Special Requests BOTTLES DRAWN AEROBIC AND ANAEROBIC 7CC  Final   Culture NO GROWTH 1 DAY  Final    Report Status PENDING  Incomplete  Blood Culture (routine x 2)     Status: None (Preliminary result)   Collection Time: 10/21/14  5:58 PM  Result Value Ref Range Status   Specimen Description RIGHT ANTECUBITAL  Final   Special Requests BOTTLES DRAWN AEROBIC AND ANAEROBIC 6CC  Final   Culture NO GROWTH 1 DAY  Final   Report Status PENDING  Incomplete     Studies: Ct Head Wo Contrast  10/21/2014   CLINICAL DATA:  history of Alzheimer's and dementia who presents to the Emergency Department complaining of difficulty swallowing both solids and liquids with onset last night/early this morning. Relative states she also noticed around 0700 today some generalized weakness, left eye drooping, and decreased talking. She states he normally has no difficulty eating, is talkative though confused, and is ambulatory without assistance. Family notes he was discharged from [facility] yesterday, where he was hospitalized for 2 days for sepsis possibly due to pneumonia.  EXAM: CT HEAD WITHOUT CONTRAST  TECHNIQUE: Contiguous axial images were obtained from the base of the skull through the vertex without intravenous contrast.  COMPARISON:  08/11/2014  FINDINGS: Ventricles and cisterns are within normal. There is mild age related atrophic change as well as chronic ischemic microvascular disease. There is no mass, mass effect, shift of midline structures or acute hemorrhage. No evidence to suggest acute infarction. Remaining bones and soft tissues are unchanged.  IMPRESSION: No acute intracranial findings.  Mild chronic ischemic microvascular disease with age related atrophic change.   Electronically Signed   By: Elberta Fortis M.D.   On: 10/21/2014 19:19   Dg Chest Port 1 View  10/21/2014   CLINICAL DATA:  Status post choking episode; unresponsive. Initial encounter.  EXAM: PORTABLE CHEST - 1 VIEW  COMPARISON:  Chest radiograph performed 08/12/2014  FINDINGS: The lungs are mildly hypoexpanded. Vascular congestion is  noted, with increased interstitial markings, concerning for mild recurrent interstitial edema. No pleural effusion or pneumothorax is seen.  The cardiomediastinal silhouette is borderline normal in size. No acute osseous abnormalities are identified.  IMPRESSION: Lungs mildly hypoexpanded. Vascular congestion, with increased interstitial markings, concerning for mild interstitial edema. Pneumonia could conceivably have a similar appearance.   Electronically Signed   By: Roanna Raider M.D.   On: 10/21/2014 18:45    Scheduled Meds: . aspirin EC  81 mg Oral q morning - 10a  . enoxaparin (LOVENOX) injection  40 mg Subcutaneous Q24H  . ipratropium-albuterol  3 mL Nebulization Q6H  . pantoprazole  40 mg Oral Daily  . piperacillin-tazobactam (ZOSYN)  IV  3.375 g Intravenous 3 times per day  . potassium chloride  10 mEq Intravenous Q1 Hr x 4  . sodium chloride  3 mL Intravenous Q12H  . sodium chloride  3  mL Intravenous Q12H  . vancomycin  500 mg Intravenous Q12H   Continuous Infusions: . sodium chloride 75 mL/hr at 10/22/14 1330    Principal Problem:   Encephalopathy Active Problems:   Dementia   Weakness   Aspiration pneumonia   COPD (chronic obstructive pulmonary disease)   Acute respiratory failure with hypoxia   Hypokalemia   Chronic diastolic congestive heart failure   Acute dyspnea    Time spent: 40mins    [facility] Sugar LandMEMON,Starlene Consuegra  [facility] Pager [phone]. If 7PM-7AM, please contact night-coverage at [email or web address], password [email or web address] 10/22/2014, 5:38 PM  LOS: 1 day

## 2014-10-23 ENCOUNTER — Inpatient Hospital Stay (HOSPITAL_COMMUNITY): Payer: Medicare Other

## 2014-10-23 DIAGNOSIS — I5032 Chronic diastolic (congestive) heart failure: Secondary | ICD-10-CM

## 2014-10-23 LAB — CBC
HCT: 33.9 % — ABNORMAL LOW (ref 39.0–52.0)
Hemoglobin: 11 g/dL — ABNORMAL LOW (ref 13.0–17.0)
MCH: 31.2 pg (ref 26.0–34.0)
MCHC: 32.4 g/dL (ref 30.0–36.0)
MCV: 96 fL (ref 78.0–100.0)
Platelets: 220 10*3/uL (ref 150–400)
RBC: 3.53 MIL/uL — ABNORMAL LOW (ref 4.22–5.81)
RDW: 15 % (ref 11.5–15.5)
WBC: 6.4 10*3/uL (ref 4.0–10.5)

## 2014-10-23 LAB — BASIC METABOLIC PANEL
Anion gap: 8 (ref 5–15)
BUN: 16 mg/dL (ref 6–23)
CALCIUM: 7.6 mg/dL — AB (ref 8.4–10.5)
CO2: 28 mmol/L (ref 19–32)
Chloride: 104 mmol/L (ref 96–112)
Creatinine, Ser: 0.68 mg/dL (ref 0.50–1.35)
GFR calc Af Amer: >90 mL/min (ref >90)
GFR calc non Af Amer: 87 mL/min — ABNORMAL LOW (ref >90)
Glucose, Bld: 79 mg/dL (ref 70–99)
Potassium: 3.1 mmol/L — ABNORMAL LOW (ref 3.5–5.1)
SODIUM: 140 mmol/L (ref 135–145)

## 2014-10-23 LAB — URINE CULTURE
CULTURE: NO GROWTH
Colony Count: NO GROWTH

## 2014-10-23 MED ORDER — POTASSIUM CHLORIDE 10 MEQ/100ML IV SOLN
10.0000 meq | INTRAVENOUS | Status: AC
  Administered 2014-10-23 (×4): 10 meq via INTRAVENOUS
  Filled 2014-10-23: qty 100

## 2014-10-23 NOTE — Care Management Note (Addendum)
    Page 1 of 1   10/25/2014     4:07:03 PM CARE MANAGEMENT NOTE 10/25/2014  Patient:  Chad Everett,Chad Everett   Account Number:  [id number]  Date Initiated:  10/23/2014  Documentation initiated by:  Sharrie RothmanBLACKWELL,Lyndsee Casa C  Subjective/Objective Assessment:   Pt admitted from home with possible pneumonia. Pt lives with his granddaugter and required mod assistance with ADL's. Pt did not use an assistive devices prior to admission.     Action/Plan:   Awaiting PT recommendations. Will continue to follow for discharge planning needs. SLP to also eval pt.   Anticipated DC Date:  10/26/2014   Anticipated DC Plan:  HOME W HOME HEALTH SERVICES      DC Planning Services  CM consult      [facility]   Choice offered to / List presented to:  C-4 Adult Children        HH arranged  HH[facility] RN      [facility]   Status of service:  Completed, signed off Medicare Important Message given?  YES (If response is "NO", the following Medicare IM given date fields will be blank) Date Medicare IM given:  10/25/2014 Medicare IM given by:  Sharrie RothmanBLACKWELL,Takashi Korol C Date Additional Medicare IM given:   Additional Medicare IM given by:    Discharge Disposition:  HOME W HOME HEALTH SERVICES  Per UR Regulation:    If discussed at Long Length of Stay Meetings, dates discussed:    Comments:  10/25/14 1600 Arlyss Queenammy Chesley Veasey, RN BSN CM [facility] Referral called and [facility] [facility]. HH services to start within 48 hours of discharge. No DME needs noted. Pt and pts nurse aware of discharge arrangements.  10/23/14 1215 Arlyss Queenammy Jaeleah Smyser, RN BSN CM

## 2014-10-23 NOTE — Evaluation (Signed)
Physical Therapy Evaluation Patient Details Name: Chrisopher Pustejovsky MRN: [id number] DOB: [birth year] Today's Date: 10/23/2014   History of Present Illness  81 yr old male who  has a past medical history of Dementia; GERD (gastroesophageal reflux disease); Anxiety; and Pneumonia. Today was brought to the ED for worsening lethargy and generalized weakness. Patient was discharged from [facility] yesterday after he was therefore shortness of breath. As per family patient has been sleeping most of the time, and has been weak more than usual. Patient is unable to provide any history at this time.  He has a history of dementia.  Clinical Impression  Pt was seen for evaluation and found to be able to perform transfers and gait with no assistive device for functional distance, no instability.  He was initially on 2 L O2  With O2 sat=98%, but on room air with gait, O2 sat-92% with no dyspnea.    Follow Up Recommendations No PT follow up    Equipment Recommendations  None recommended by PT    Recommendations for Other Services  none     Precautions / Restrictions Precautions Precautions: Fall Restrictions Weight Bearing Restrictions: No      Mobility  Bed Mobility Overal bed mobility: Needs Assistance Bed Mobility: Supine to Sit     Supine to sit: Supervision        Transfers Overall transfer level: Needs assistance Equipment used: None Transfers: Sit to/from Stand Sit to Stand: Supervision            Ambulation/Gait Ambulation/Gait assistance: Supervision Ambulation Distance (Feet): 150 Feet Assistive device: None Gait Pattern/deviations: Narrow base of support;Shuffle;Decreased step length - right;Decreased step length - left   Gait velocity interpretation: Below normal speed for age/gender    Stairs            Wheelchair Mobility    Modified Rankin (Stroke Patients Only)       Balance Overall balance assessment: No apparent balance  deficits (not formally assessed)                                           Pertinent Vitals/Pain Pain Assessment: No/denies pain    Home Living Family/patient expects to be discharged to:: Private residence Living Arrangements: Other relatives Available Help at Discharge: Family             Additional Comments: pt is unable to provide information above    Prior Function           Comments: unknown     Hand Dominance        Extremity/Trunk Assessment               Lower Extremity Assessment: Overall WFL for tasks assessed         Communication   Communication: HOH  Cognition Arousal/Alertness: Awake/alert Behavior During Therapy: WFL for tasks assessed/performed Overall Cognitive Status: History of cognitive impairments - at baseline                      General Comments      Exercises        Assessment/Plan    PT Assessment Patent does not need any further PT services  PT Diagnosis     PT Problem List    PT Treatment Interventions     PT Goals (Current goals can be found in the Care  Plan section) Acute Rehab PT Goals PT Goal Formulation: All assessment and education complete, DC therapy    Frequency     Barriers to discharge        Co-evaluation               End of Session Equipment Utilized During Treatment: Gait belt Activity Tolerance: Patient tolerated treatment well Patient left: in chair;with call bell/phone within reach;with chair alarm set           Time: [id number] PT Time Calculation (min) (ACUTE ONLY): 23 min   Charges:   PT Evaluation $Initial PT Evaluation Tier I: 1 Procedure     PT G CodesMyrlene Broker:        Brylei Pedley L 10/23/2014, 9:16 AM

## 2014-10-23 NOTE — Progress Notes (Signed)
UR chart review completed.  

## 2014-10-23 NOTE — Progress Notes (Signed)
[facility] PROGRESS NOTE  Chad Everett ZOX:[id number] DOB: 1931/07/25 DOA: 10/21/2014 PCP: [facility]  Assessment/Plan: 1. Encephalopathy. Patient has known underlying dementia, but family reports that he has been increasingly somnolent over the past several days. He had difficulty walking due to weakness. His granddaughter feels that he may have had difficulty raising his right arm, although she is unsure if this was a bilateral weakness. There was question of slurring of his speech. He said difficulty swallowing over the last several days and has had several aspiration episodes. All these features may be explained by an underlying metabolic encephalopathy due to an underlying infection. MRI brain negative for acute infarct. Work up has been otherwise unremarkable and he has significantly improved with IV fluids and antibiotics. Continue current treatment 2. Aspiration pneumonia. Patient had several episodes of aspiration of the last several days. Will request speech therapy to evaluate. He he was initially started on vancomycin and cefepime. Will discontinue cefepime in favor of Zosyn for anaerobic coverage. 3. Acute respiratory failure with hypoxia. Likely related to #2. Now breathing comfortably on room air 4. Hypokalemia. Replace 5. COPD. Appears stable at this time. No evidence of wheezing on exam. Continue bronchodilators. 6. Chronic diastolic congestive heart failure. The patient appears to be dehydrated on clinical exam. Continue to follow. 7. Dementia, chronic, stable  Code Status: DNR Family Communication: discussed with grand daughter Disposition Plan: discharge home once improved, likely tomorrow after speech therapy evaluation   Consultants:    Procedures:    Antibiotics:  Vancomycin 3/12  Zosyn 3/13  HPI/Subjective: Denies any complaints, confused, dementia  Objective: Filed Vitals:   10/23/14 0559  BP: 132/55  Pulse: 61  Temp:  98.6 F (37 C)  Resp: 20    Intake/Output Summary (Last 24 hours) at 10/23/14 1405 Last data filed at 10/23/14 1312  Gross per 24 hour  Intake  830.5 ml  Output    975 ml  Net -144.5 ml   Filed Weights   10/21/14 1725 10/21/14 2052  Weight: 66.225 kg (146 lb) 65.273 kg (143 lb 14.4 oz)    Exam:   General:  Awake, sitting up in chair, no distress  Cardiovascular: s1, s2, rrr  Respiratory: CTA B  Abdomen: soft, nt, nd, bs+  Musculoskeletal: no edema b/l  Data Reviewed: Basic Metabolic Panel:  Recent Labs Lab 10/21/14 1756 10/22/14 0708 10/22/14 1422 10/23/14 0555  NA 138 140  --  140  K 3.6 2.9*  --  3.1*  CL 102 102  --  104  CO2 29 31  --  28  GLUCOSE 147* 121*  --  79  BUN 13 15  --  16  CREATININE 0.98 0.78  --  0.68  CALCIUM 8.2* 7.9*  --  7.6*  MG  --   --  1.9  --    Liver Function Tests:  Recent Labs Lab 10/21/14 1756 10/22/14 0708  AST 18 17  ALT 15 14  ALKPHOS 58 59  BILITOT 1.0 1.1  PROT 6.7 6.6  ALBUMIN 2.9* 2.6*   No results for input(s): LIPASE, AMYLASE in the last 168 hours.  Recent Labs Lab 10/22/14 1422  AMMONIA 35*   CBC:  Recent Labs Lab 10/21/14 1756 10/22/14 0708 10/23/14 0555  WBC 11.3* 10.0 6.4  NEUTROABS 8.8*  --   --   HGB 12.3* 11.6* 11.0*  HCT 36.5* 35.4* 33.9*  MCV 94.1 94.4 96.0  PLT 207 207 220   Cardiac Enzymes:  Recent Labs Lab 10/21/14 1756  TROPONINI 0.03   BNP (last 3 results)  Recent Labs  10/21/14 1758  BNP 201.0*    ProBNP (last 3 results) No results for input(s): PROBNP in the last 8760 hours.  CBG: No results for input(s): GLUCAP in the last 168 hours.  Recent Results (from the past 240 hour(s))  Blood Culture (routine x 2)     Status: None (Preliminary result)   Collection Time: 10/21/14  5:53 PM  Result Value Ref Range Status   Specimen Description BLOOD LEFT ANTECUBITAL  Final   Special Requests BOTTLES DRAWN AEROBIC AND ANAEROBIC 7CC  Final   Culture NO GROWTH 2 DAYS   Final   Report Status PENDING  Incomplete  Blood Culture (routine x 2)     Status: None (Preliminary result)   Collection Time: 10/21/14  5:58 PM  Result Value Ref Range Status   Specimen Description BLOOD RIGHT ANTECUBITAL  Final   Special Requests BOTTLES DRAWN AEROBIC AND ANAEROBIC 6CC  Final   Culture NO GROWTH 2 DAYS  Final   Report Status PENDING  Incomplete     Studies: Ct Head Wo Contrast  10/21/2014   CLINICAL DATA:  history of Alzheimer's and dementia who presents to the Emergency Department complaining of difficulty swallowing both solids and liquids with onset last night/early this morning. Relative states she also noticed around 0700 today some generalized weakness, left eye drooping, and decreased talking. She states he normally has no difficulty eating, is talkative though confused, and is ambulatory without assistance. Family notes [facility] where he was hospitalized for 2 days for sepsis possibly due to pneumonia.  EXAM: CT HEAD WITHOUT CONTRAST  TECHNIQUE: Contiguous axial images were obtained from the base of the skull through the vertex without intravenous contrast.  COMPARISON:  08/11/2014  FINDINGS: Ventricles and cisterns are within normal. There is mild age related atrophic change as well as chronic ischemic microvascular disease. There is no mass, mass effect, shift of midline structures or acute hemorrhage. No evidence to suggest acute infarction. Remaining bones and soft tissues are unchanged.  IMPRESSION: No acute intracranial findings.  Mild chronic ischemic microvascular disease with age related atrophic change.   Electronically Signed   By: Elberta Fortisaniel  Boyle M.D.   On: 10/21/2014 19:19   Mr Brain Wo Contrast  10/23/2014   CLINICAL DATA:  Dementia.  Confusion.  Lethargy.  EXAM: MRI HEAD WITHOUT CONTRAST  TECHNIQUE: Multiplanar, multiecho pulse sequences of the brain and surrounding structures were obtained without intravenous  contrast.  COMPARISON:  Head CT 10/21/2014  FINDINGS: Diffusion imaging does not show any acute or subacute infarction. The brainstem is normal. There are a few old small vessel cerebellar infarctions. Within the cerebral hemispheres, there is generalized atrophy. There chronic small-vessel ischemic changes affecting the deep and subcortical white matter. No cortical or large vessel territory infarction. No mass lesion, hemorrhage, hydrocephalus or extra-axial collection. No pituitary mass. No sinus disease. No skull or skullbase lesion. Major vessels at the base of the brain show flow.  IMPRESSION: No acute or reversible finding. Atrophy and chronic small vessel ischemic changes as outlined above.   Electronically Signed   By: Paulina FusiMark  Shogry M.D.   On: 10/23/2014 10:13   Dg Chest Port 1 View  10/21/2014   CLINICAL DATA:  Status post choking episode; unresponsive. Initial encounter.  EXAM: PORTABLE CHEST - 1 VIEW  COMPARISON:  Chest radiograph performed 08/12/2014  FINDINGS: The lungs are  mildly hypoexpanded. Vascular congestion is noted, with increased interstitial markings, concerning for mild recurrent interstitial edema. No pleural effusion or pneumothorax is seen.  The cardiomediastinal silhouette is borderline normal in size. No acute osseous abnormalities are identified.  IMPRESSION: Lungs mildly hypoexpanded. Vascular congestion, with increased interstitial markings, concerning for mild interstitial edema. Pneumonia could conceivably have a similar appearance.   Electronically Signed   By: Roanna Raider M.D.   On: 10/21/2014 18:45    Scheduled Meds: . aspirin EC  81 mg Oral q morning - 10a  . enoxaparin (LOVENOX) injection  40 mg Subcutaneous Q24H  . ipratropium-albuterol  3 mL Nebulization Q6H  . pantoprazole  40 mg Oral Daily  . piperacillin-tazobactam (ZOSYN)  IV  3.375 g Intravenous 3 times per day  . potassium chloride  10 mEq Intravenous Q1 Hr x 4  . sodium chloride  3 mL Intravenous Q12H   . sodium chloride  3 mL Intravenous Q12H  . vancomycin  500 mg Intravenous Q12H   Continuous Infusions: . sodium chloride 75 mL/hr at 10/22/14 1330    Principal Problem:   Encephalopathy Active Problems:   Dementia   Weakness   Aspiration pneumonia   COPD (chronic obstructive pulmonary disease)   Acute respiratory failure with hypoxia   Hypokalemia   Chronic diastolic congestive heart failure   Acute dyspnea    Time spent:    Chad Everett,Chad Everett  [facility] Pager [phone]. If 7PM-7AM, please contact night-coverage at [email or web address], password [facility] 10/23/2014, 2:05 PM  LOS: 2 days

## 2014-10-24 LAB — BASIC METABOLIC PANEL
ANION GAP: 6 (ref 5–15)
BUN: 10 mg/dL (ref 6–23)
CHLORIDE: 105 mmol/L (ref 96–112)
CO2: 28 mmol/L (ref 19–32)
Calcium: 7.7 mg/dL — ABNORMAL LOW (ref 8.4–10.5)
Creatinine, Ser: 0.64 mg/dL (ref 0.50–1.35)
GFR calc non Af Amer: 89 mL/min — ABNORMAL LOW (ref >90)
Glucose, Bld: 108 mg/dL — ABNORMAL HIGH (ref 70–99)
POTASSIUM: 3.3 mmol/L — AB (ref 3.5–5.1)
Sodium: 139 mmol/L (ref 135–145)

## 2014-10-24 MED ORDER — POTASSIUM CHLORIDE 10 MEQ/100ML IV SOLN
10.0000 meq | Freq: Once | INTRAVENOUS | Status: AC
  Administered 2014-10-24: 10 meq via INTRAVENOUS

## 2014-10-24 MED ORDER — POTASSIUM CHLORIDE 10 MEQ/100ML IV SOLN
10.0000 meq | INTRAVENOUS | Status: AC
  Administered 2014-10-24 (×3): 10 meq via INTRAVENOUS
  Filled 2014-10-24: qty 100

## 2014-10-24 NOTE — Evaluation (Signed)
Clinical/Bedside Swallow Evaluation Patient Details  Name: Chad Everett MRN: [id number] Date of Birth: [birth year]  Today's Date: 10/24/2014 Time: SLP Start Time (ACUTE ONLY): 0930 SLP Stop Time (ACUTE ONLY): 1004 SLP Time Calculation (min) (ACUTE ONLY): 34 min  Past Medical History:  Past Medical History  Diagnosis Date  . Dementia   . GERD (gastroesophageal reflux disease)   . Anxiety   . Pneumonia    Past Surgical History: History reviewed. No pertinent past surgical history. HPI:  81 yr old male who has a past medical history of Dementia; GERD (gastroesophageal reflux disease); Anxiety; and Pneumonia. Today was brought to the ED for worsening lethargy and generalized weakness. Patient was discharged from [facility] yesterday after he was therefore shortness of breath. As per family patient has been sleeping most of the time, and has been weak more than usual. Patient is unable to provide any history at this time. He has a history of dementia.   Assessment / Plan / Recommendation Clinical Impression  [name] is a delightful gentleman who is known to this SLP from previous admission in January 2016. BSE was completed at that time and recommendation made for D2 and thin liquids. Pt was recently admitted and discharged from [facility]. Per his grandaughter, Herbert SetaHeather, pt choked on some eggs and Heimlich maneuver was used. He also choked on some Gatorade at home on Saturday. Clinical swallow examination reveals seemingly functional and safe swallow, however given history of PNA, frequent hospitalizations for the same, and family report of difficulty swallowing, recommend MBSS to objectively assess swallow. Will order for later today.    Aspiration Risk  Mild    Diet Recommendation  Pending MBSS later today       Other  Recommendations Recommended Consults: MBS Oral Care Recommendations: Oral care BID   Follow Up Recommendations        Frequency and Duration min 2x/week  1 week   Pertinent Vitals/Pain VSS    SLP Swallow Goals   Pt will demonstrate safe and efficient consumption of least restrictive diet with use of strategies as needed.    Swallow Study Prior Functional Status   Lives at home with his granddaughter, Veatrice BourbonShana    General Date of Onset: 10/21/14 HPI: 81 yr old male who has a past medical history of Dementia; GERD (gastroesophageal reflux disease); Anxiety; and Pneumonia. Today was brought to the ED for worsening lethargy and generalized weakness. Patient was discharged from [facility] yesterday after he was therefore shortness of breath. As per family patient has been sleeping most of the time, and has been weak more than usual. Patient is unable to provide any history at this time. He has a history of dementia. Type of Study: Bedside swallow evaluation Previous Swallow Assessment: January 2016 BSE D2/thin Diet Prior to this Study: Dysphagia 1 (puree);Nectar-thick liquids Temperature Spikes Noted: No Respiratory Status: Room air History of Recent Intubation: No Behavior/Cognition: Alert;Cooperative;Pleasant mood;Hard of hearing Oral Cavity - Dentition: Dentures, top (missing lower) Self-Feeding Abilities: Able to feed self Patient Positioning: Upright in bed Baseline Vocal Quality: Clear Volitional Cough: Strong Volitional Swallow: Able to elicit    Oral/Motor/Sensory Function Overall Oral Motor/Sensory Function: Appears within functional limits for tasks assessed Labial ROM: Within Functional Limits Labial Symmetry: Within Functional Limits Labial Strength: Within Functional Limits Labial Sensation: Within Functional Limits Lingual ROM: Within Functional Limits Lingual Symmetry: Within Functional Limits Lingual Strength: Within Functional Limits Lingual Sensation: Within Functional Limits Facial ROM: Within Functional  Limits Facial Symmetry: Within Functional  Limits Facial Strength: Within Functional Limits Facial Sensation: Within Functional Limits Velum: Within Functional Limits Mandible: Within Functional Limits   Ice Chips Ice chips: Within functional limits Presentation: Spoon   Thin Liquid Thin Liquid: Within functional limits Presentation: Cup;Spoon;Self Fed;Straw Other Comments:  (mild wet vocal quality)    Nectar Thick Nectar Thick Liquid: Within functional limits Presentation: Self Fed;Cup   Honey Thick Honey Thick Liquid: Not tested   Puree Puree: Within functional limits Presentation: Spoon   Solid       Solid: Within functional limits Presentation: Self Fed      Thank you,  Havery Moros, CCC-SLP [phone]  PORTER,DABNEY 10/24/2014,10:12 AM

## 2014-10-24 NOTE — Progress Notes (Signed)
[facility] PROGRESS NOTE  Chad DerrickJohn Everett ZOX:[id number] DOB: August 30, 1930 DOA: 10/21/2014 PCP: [facility]  Summary:  This is an 81 year old gentleman who spends the hospital with encephalopathy and shortness of breath. He was recently evaluated [facility] and treated for shortness of breath which was presumed pneumonia. Family reports he was treated with antibiotics and discharged home. They had noted aspiration episodes during his stay [facility]. He returned within a few days to [facility] with worsening mental status, lethargy as well as shortness of breath and fever. Chest x-ray indicated pneumonia and he was started on appropriate antibiotics as well as intravenous hydration. Other workup for encephalopathy has been unremarkable. With IV fluids and antibiotics, his mental status has returned to baseline. Since he's had multiple episodes of aspiration, speech therapy was consulted and will plan on an MBSS in the morning. After this study has been completed, he can likely be discharged home.  Assessment/Plan: 1. Encephalopathy. Patient has known underlying dementia, but family reports that he was increasingly somnolent over the past several days prior to admission. He had difficulty walking due to weakness. His granddaughter feels that he may have had difficulty raising his right arm, although she is unsure if this was a bilateral weakness. There was question of slurring of his speech. He had difficulty swallowing over the last several days and had several aspiration episodes. All these features may be explained by an underlying metabolic encephalopathy due to an underlying infection. MRI brain negative for acute infarct. Work up has been otherwise unremarkable and he has significantly improved with IV fluids and antibiotics. Continue current treatment 2. Aspiration pneumonia. Patient had several episodes of aspiration of the last several days. Speech  therapy has seen patient and plans on MBSS in am. He he was initially started on vancomycin and cefepime. Will discontinue cefepime in favor of Zosyn for anaerobic coverage. Would likely transition to augmentin once swallow evaluation complete 3. Acute respiratory failure with hypoxia. Likely related to #2. Now breathing comfortably on room air 4. Hypokalemia. Replace 5. COPD. Appears stable at this time. No evidence of wheezing on exam. Continue bronchodilators. 6. Chronic diastolic congestive heart failure. Appears compensated. 7. Dementia, chronic, stable  Code Status: DNR Family Communication: no family present Disposition Plan: discharge home once improved, likely tomorrow after speech therapy evaluation   Consultants:    Procedures:    Antibiotics:  Vancomycin 3/12 - 3/15  Zosyn 3/13  HPI/Subjective: Denies any complaints, confused, dementia  Objective: Filed Vitals:   10/24/14 1409  BP: 155/60  Pulse: 94  Temp: 98.3 F (36.8 C)  Resp: 20    Intake/Output Summary (Last 24 hours) at 10/24/14 1716 Last data filed at 10/24/14 1339  Gross per 24 hour  Intake 2428.08 ml  Output   1600 ml  Net 828.08 ml   Filed Weights   10/21/14 1725 10/21/14 2052  Weight: 66.225 kg (146 lb) 65.273 kg (143 lb 14.4 oz)    Exam:   General:  Awake, sitting up in chair, no distress  Cardiovascular: s1, s2, rrr  Respiratory: CTA B  Abdomen: soft, nt, nd, bs+  Musculoskeletal: no edema b/l  Data Reviewed: Basic Metabolic Panel:  Recent Labs Lab 10/21/14 1756 10/22/14 0708 10/22/14 1422 10/23/14 0555 10/24/14 0556  NA 138 140  --  140 139  K 3.6 2.9*  --  3.1* 3.3*  CL 102 102  --  104 105  CO2 29 31  --  28  28  GLUCOSE 147* 121*  --  79 108*  BUN 13 15  --  16 10  CREATININE 0.98 0.78  --  0.68 0.64  CALCIUM 8.2* 7.9*  --  7.6* 7.7*  MG  --   --  1.9  --   --    Liver Function Tests:  Recent Labs Lab 10/21/14 1756 10/22/14 0708  AST 18 17  ALT 15  14  ALKPHOS 58 59  BILITOT 1.0 1.1  PROT 6.7 6.6  ALBUMIN 2.9* 2.6*   No results for input(s): LIPASE, AMYLASE in the last 168 hours.  Recent Labs Lab 10/22/14 1422  AMMONIA 35*   CBC:  Recent Labs Lab 10/21/14 1756 10/22/14 0708 10/23/14 0555  WBC 11.3* 10.0 6.4  NEUTROABS 8.8*  --   --   HGB 12.3* 11.6* 11.0*  HCT 36.5* 35.4* 33.9*  MCV 94.1 94.4 96.0  PLT 207 207 220   Cardiac Enzymes:  Recent Labs Lab 10/21/14 1756  TROPONINI 0.03   BNP (last 3 results)  Recent Labs  10/21/14 1758  BNP 201.0*    ProBNP (last 3 results) No results for input(s): PROBNP in the last 8760 hours.  CBG: No results for input(s): GLUCAP in the last 168 hours.  Recent Results (from the past 240 hour(s))  Blood Culture (routine x 2)     Status: None (Preliminary result)   Collection Time: 10/21/14  5:53 PM  Result Value Ref Range Status   Specimen Description BLOOD LEFT ANTECUBITAL  Final   Special Requests BOTTLES DRAWN AEROBIC AND ANAEROBIC 7CC  Final   Culture NO GROWTH 3 DAYS  Final   Report Status PENDING  Incomplete  Blood Culture (routine x 2)     Status: None (Preliminary result)   Collection Time: 10/21/14  5:58 PM  Result Value Ref Range Status   Specimen Description BLOOD RIGHT ANTECUBITAL  Final   Special Requests BOTTLES DRAWN AEROBIC AND ANAEROBIC 6CC  Final   Culture NO GROWTH 3 DAYS  Final   Report Status PENDING  Incomplete  Urine culture     Status: None   Collection Time: 10/22/14  9:32 AM  Result Value Ref Range Status   Specimen Description URINE, CLEAN CATCH  Final   Special Requests NONE  Final   Colony Count NO GROWTH Performed at [facility]   Final   Culture NO GROWTH Performed at [facility]   Final   Report Status 10/23/2014 FINAL  Final     Studies: Mr Brain Wo Contrast  10/23/2014   CLINICAL DATA:  Dementia.  Confusion.  Lethargy.  EXAM: MRI HEAD WITHOUT CONTRAST  TECHNIQUE: Multiplanar, multiecho pulse sequences  of the brain and surrounding structures were obtained without intravenous contrast.  COMPARISON:  Head CT 10/21/2014  FINDINGS: Diffusion imaging does not show any acute or subacute infarction. The brainstem is normal. There are a few old small vessel cerebellar infarctions. Within the cerebral hemispheres, there is generalized atrophy. There chronic small-vessel ischemic changes affecting the deep and subcortical white matter. No cortical or large vessel territory infarction. No mass lesion, hemorrhage, hydrocephalus or extra-axial collection. No pituitary mass. No sinus disease. No skull or skullbase lesion. Major vessels at the base of the brain show flow.  IMPRESSION: No acute or reversible finding. Atrophy and chronic small vessel ischemic changes as outlined above.   Electronically Signed   By: Paulina Fusi M.D.   On: 10/23/2014 10:13    Scheduled Meds: . aspirin  EC  81 mg Oral q morning - 10a  . enoxaparin (LOVENOX) injection  40 mg Subcutaneous Q24H  . ipratropium-albuterol  3 mL Nebulization Q6H  . pantoprazole  40 mg Oral Daily  . piperacillin-tazobactam (ZOSYN)  IV  3.375 g Intravenous 3 times per day  . sodium chloride  3 mL Intravenous Q12H  . sodium chloride  3 mL Intravenous Q12H  . vancomycin  500 mg Intravenous Q12H   Continuous Infusions:    Principal Problem:   Encephalopathy Active Problems:   Dementia   Weakness   Aspiration pneumonia   COPD (chronic obstructive pulmonary disease)   Acute respiratory failure with hypoxia   Hypokalemia   Chronic diastolic congestive heart failure   Acute dyspnea    Time spent:    Roxy Filler  [facility] Pager [phone]. If 7PM-7AM, please contact night-coverage at [email or web address], password [email or web address] 10/24/2014, 5:16 PM  LOS: 3 days

## 2014-10-24 NOTE — Progress Notes (Signed)
SPEECH PATHOLOGY  SLP attempted to schedule MBSS this AM, however the fluoro machine is not working at this time and they have someone coming out to repair it. Radiology will call SLP when room is ready. Continue diet as ordered for now (D1/NTL).  Thank you,  Havery MorosDabney Lovelle Lema, CCC-SLP [phone]

## 2014-10-24 NOTE — Progress Notes (Signed)
Condom cath off in bed.  Instructed patient to call for urinal, understanding verbalized.

## 2014-10-25 DIAGNOSIS — R531 Weakness: Secondary | ICD-10-CM

## 2014-10-25 LAB — VANCOMYCIN, TROUGH: Vancomycin Tr: 8.7 ug/mL — ABNORMAL LOW (ref 10.0–20.0)

## 2014-10-25 LAB — BASIC METABOLIC PANEL
Anion gap: 5 (ref 5–15)
BUN: 6 mg/dL (ref 6–23)
CO2: 27 mmol/L (ref 19–32)
Calcium: 7.7 mg/dL — ABNORMAL LOW (ref 8.4–10.5)
Chloride: 106 mmol/L (ref 96–112)
Creatinine, Ser: 0.7 mg/dL (ref 0.50–1.35)
GFR calc Af Amer: >90 mL/min (ref >90)
GFR calc non Af Amer: 86 mL/min — ABNORMAL LOW (ref >90)
GLUCOSE: 112 mg/dL — AB (ref 70–99)
Potassium: 3.6 mmol/L (ref 3.5–5.1)
Sodium: 138 mmol/L (ref 135–145)

## 2014-10-25 MED ORDER — BISACODYL 10 MG RE SUPP
10.0000 mg | Freq: Every day | RECTAL | Status: DC | PRN
  Filled 2014-10-25: qty 1

## 2014-10-25 MED ORDER — MAGNESIUM CITRATE PO SOLN
1.0000 | ORAL | Status: AC
  Administered 2014-10-25: 1 via ORAL
  Filled 2014-10-25: qty 296

## 2014-10-25 MED ORDER — AMOXICILLIN-POT CLAVULANATE 875-125 MG PO TABS: 1.0000 | ORAL_TABLET | Freq: Two times a day (BID) | ORAL | Status: DC

## 2014-10-25 NOTE — Discharge Summary (Signed)
Physician Discharge Summary  Chad Everett ZOX:[id number] DOB: 1930-11-20 DOA: 10/21/2014  PCP: [facility]  Admit date: 10/21/2014 Discharge date: 10/25/2014  Time spent: 25 minutes  Recommendations for Outpatient Follow-up:  1. Follow up with PCP in 1-2 weeks  Discharge Diagnoses:  Principal Problem:   Encephalopathy Active Problems:   Dementia   Weakness   Aspiration pneumonia   COPD (chronic obstructive pulmonary disease)   Acute respiratory failure with hypoxia   Hypokalemia   Chronic diastolic congestive heart failure   Acute dyspnea   Discharge Condition: Stable  Diet recommendation: Dysphagia 3 mechanical soft with thin liquids, Pills whole or crushed in puree, aspiration precautions  Filed Weights   10/21/14 1725 10/21/14 2052  Weight: 66.225 kg (146 lb) 65.273 kg (143 lb 14.4 oz)    History of present illness:  Please refer to H and P from 3/12 for details. Briefly, pt presented with lethargy with SOB with concerns for aspiration pneumonia. The patient was admitted for further work up.  Hospital Course:  Summary: This is an 81 year old gentleman who spends the hospital with encephalopathy and shortness of breath. He was recently evaluated at [facility] and treated for shortness of breath which was presumed pneumonia. Family reports he was treated with antibiotics and discharged home. They had noted aspiration episodes during his stay at [facility]. He returned within a few days to [facility] with worsening mental status, lethargy as well as shortness of breath and fever. Chest x-ray indicated pneumonia and he was started on appropriate antibiotics as well as intravenous hydration. Other workup for encephalopathy has been unremarkable. With IV fluids and antibiotics, his mental status has returned to baseline. Since he's had multiple episodes of aspiration, speech therapy was consulted and will plan on an MBS in the morning.  Unfortunately, imager for MBSS malfunctioned and the study could not be obtained. Discussed case with SLP who recommends outpatient study instead with recommendation for dysphagia 3 mechanical soft diet with thin liquids. Family is in agreement  1. Encephalopathy. Patient has known underlying dementia, but family reports that he was increasingly somnolent over the past several days prior to admission. He had difficulty walking due to weakness. His granddaughter feels that he may have had difficulty raising his right arm, although she is unsure if this was a bilateral weakness. There was question of slurring of his speech. He had difficulty swallowing over the last several days and had several aspiration episodes. All these features may be explained by an underlying metabolic encephalopathy due to an underlying infection, presumably from suspected aspiration pneumonia per below. MRI brain negative for acute infarct. 2. Aspiration pneumonia. Patient had several episodes of aspiration of the last several days. Speech therapy has seen patient and plans on MBS in am. He he was initially started on vancomycin and cefepime. Will discontinue cefepime in favor of Zosyn for anaerobic coverage. Patient to complete course of augmentin on discharge. MBS to be done as outpatient 3. Acute respiratory failure with hypoxia. Likely related to #2. Now breathing comfortably on room air 4. Hypokalemia. Replace 5. COPD. Appears stable at this time. No evidence of wheezing on exam. Continue bronchodilators. 6. Chronic diastolic congestive heart failure. Appears compensated. 7. Dementia, chronic, stable 8. Constipation: Given mag citrate  Consultations:  SLP  Discharge Exam: Filed Vitals:   10/25/14 0504 10/25/14 0704 10/25/14 1410 10/25/14 1536  BP: 127/62   136/62  Pulse: 68   90  Temp: 98.9 F (37.2 C)  97.8 F (36.6 C)  TempSrc: Oral   Oral  Resp: 20   20  Height:      Weight:      SpO2: 96% 94% 97% 96%     General: Awake, in nad Cardiovascular: regular, s1, s2 Respiratory: normal resp effort, no wheezing  Discharge Instructions     Medication List    STOP taking these medications        levofloxacin 750 MG tablet  Commonly known as:  LEVAQUIN      TAKE these medications        albuterol 108 (90 BASE) MCG/ACT inhaler  Commonly known as:  PROVENTIL HFA;VENTOLIN HFA  Inhale 2 puffs into the lungs every 6 (six) hours as needed for wheezing or shortness of breath.     amoxicillin-clavulanate 875-125 MG per tablet  Commonly known as:  AUGMENTIN  Take 1 tablet by mouth 2 (two) times daily.     aspirin EC 81 MG tablet  Take 81 mg by mouth every morning.     guaifenesin 100 MG/5ML syrup  Commonly known as:  ROBITUSSIN  Take 150 mg by mouth every 4 (four) hours as needed for cough.     omeprazole 20 MG capsule  Commonly known as:  PRILOSEC  Take 20 mg by mouth daily.     predniSONE 10 MG tablet  Commonly known as:  DELTASONE  Take 10-50 mg by mouth daily with breakfast. Takes 5 tablets on day 1, 4 day 2, 3 day 3, 2 day 4, 1 day 1     VITAMIN C PO  Take 500 mg by mouth daily.       Allergies  Allergen Reactions  . Onion Other (See Comments)    Indigestion   Follow-up Information    [facility] In 1 week.   Why:  hospital follow up   Contact information:   [address] [place] [address] [phone]       [facility]   Contact information:   [address] [place] [address] [phone]        The results of significant diagnostics from this hospitalization (including imaging, microbiology, ancillary and laboratory) are listed below for reference.    Significant Diagnostic Studies: Ct Head Wo Contrast  10/21/2014   CLINICAL DATA:  history of Alzheimer's and dementia who presents to the Emergency Department complaining of difficulty swallowing both solids  and liquids with onset last night/early this morning. Relative states she also noticed around 0700 today some generalized weakness, left eye drooping, and decreased talking. She states he normally has no difficulty eating, is talkative though confused, and is ambulatory without assistance. Family notes he was discharged from [facility] yesterday, where he was hospitalized for 2 days for sepsis possibly due to pneumonia.  EXAM: CT HEAD WITHOUT CONTRAST  TECHNIQUE: Contiguous axial images were obtained from the base of the skull through the vertex without intravenous contrast.  COMPARISON:  08/11/2014  FINDINGS: Ventricles and cisterns are within normal. There is mild age related atrophic change as well as chronic ischemic microvascular disease. There is no mass, mass effect, shift of midline structures or acute hemorrhage. No evidence to suggest acute infarction. Remaining bones and soft tissues are unchanged.  IMPRESSION: No acute intracranial findings.  Mild chronic ischemic microvascular disease with age related atrophic change.   Electronically Signed   By: Elberta Fortisaniel  Boyle M.D.   On:  10/21/2014 19:19   Mr Brain Wo Contrast  10/23/2014   CLINICAL DATA:  Dementia.  Confusion.  Lethargy.  EXAM: MRI HEAD WITHOUT CONTRAST  TECHNIQUE: Multiplanar, multiecho pulse sequences of the brain and surrounding structures were obtained without intravenous contrast.  COMPARISON:  Head CT 10/21/2014  FINDINGS: Diffusion imaging does not show any acute or subacute infarction. The brainstem is normal. There are a few old small vessel cerebellar infarctions. Within the cerebral hemispheres, there is generalized atrophy. There chronic small-vessel ischemic changes affecting the deep and subcortical white matter. No cortical or large vessel territory infarction. No mass lesion, hemorrhage, hydrocephalus or extra-axial collection. No pituitary mass. No sinus disease. No skull or skullbase lesion. Major vessels at the base of the  brain show flow.  IMPRESSION: No acute or reversible finding. Atrophy and chronic small vessel ischemic changes as outlined above.   Electronically Signed   By: Paulina Fusi M.D.   On: 10/23/2014 10:13   Dg Chest Port 1 View  10/21/2014   CLINICAL DATA:  Status post choking episode; unresponsive. Initial encounter.  EXAM: PORTABLE CHEST - 1 VIEW  COMPARISON:  Chest radiograph performed 08/12/2014  FINDINGS: The lungs are mildly hypoexpanded. Vascular congestion is noted, with increased interstitial markings, concerning for mild recurrent interstitial edema. No pleural effusion or pneumothorax is seen.  The cardiomediastinal silhouette is borderline normal in size. No acute osseous abnormalities are identified.  IMPRESSION: Lungs mildly hypoexpanded. Vascular congestion, with increased interstitial markings, concerning for mild interstitial edema. Pneumonia could conceivably have a similar appearance.   Electronically Signed   By: Roanna Raider M.D.   On: 10/21/2014 18:45    Microbiology: Recent Results (from the past 240 hour(s))  Blood Culture (routine x 2)     Status: None (Preliminary result)   Collection Time: 10/21/14  5:53 PM  Result Value Ref Range Status   Specimen Description BLOOD LEFT ANTECUBITAL  Final   Special Requests BOTTLES DRAWN AEROBIC AND ANAEROBIC 7CC  Final   Culture NO GROWTH 4 DAYS  Final   Report Status PENDING  Incomplete  Blood Culture (routine x 2)     Status: None (Preliminary result)   Collection Time: 10/21/14  5:58 PM  Result Value Ref Range Status   Specimen Description BLOOD RIGHT ANTECUBITAL  Final   Special Requests BOTTLES DRAWN AEROBIC AND ANAEROBIC 6CC  Final   Culture NO GROWTH 4 DAYS  Final   Report Status PENDING  Incomplete  Urine culture     Status: None   Collection Time: 10/22/14  9:32 AM  Result Value Ref Range Status   Specimen Description URINE, CLEAN CATCH  Final   Special Requests NONE  Final   Colony Count NO GROWTH Performed at [facility]   Final   Culture NO GROWTH Performed at [facility]   Final   Report Status 10/23/2014 FINAL  Final     Labs: Basic Metabolic Panel:  Recent Labs Lab 10/21/14 1756 10/22/14 0708 10/22/14 1422 10/23/14 0555 10/24/14 0556 10/25/14 0506  NA 138 140  --  140 139 138  K 3.6 2.9*  --  3.1* 3.3* 3.6  CL 102 102  --  104 105 106  CO2 29 31  --  28 28 27   GLUCOSE 147* 121*  --  79 108* 112*  BUN 13 15  --  16 10 6   CREATININE 0.98 0.78  --  0.68 0.64 0.70  CALCIUM 8.2* 7.9*  --  7.6* 7.7*  7.7*  MG  --   --  1.9  --   --   --    Liver Function Tests:  Recent Labs Lab 10/21/14 1756 10/22/14 0708  AST 18 17  ALT 15 14  ALKPHOS 58 59  BILITOT 1.0 1.1  PROT 6.7 6.6  ALBUMIN 2.9* 2.6*   No results for input(s): LIPASE, AMYLASE in the last 168 hours.  Recent Labs Lab 10/22/14 1422  AMMONIA 35*   CBC:  Recent Labs Lab 10/21/14 1756 10/22/14 0708 10/23/14 0555  WBC 11.3* 10.0 6.4  NEUTROABS 8.8*  --   --   HGB 12.3* 11.6* 11.0*  HCT 36.5* 35.4* 33.9*  MCV 94.1 94.4 96.0  PLT 207 207 220   Cardiac Enzymes:  Recent Labs Lab 10/21/14 1756  TROPONINI 0.03   BNP: BNP (last 3 results)  Recent Labs  10/21/14 1758  BNP 201.0*    ProBNP (last 3 results) No results for input(s): PROBNP in the last 8760 hours.  CBG: No results for input(s): GLUCAP in the last 168 hours.   Signed:  CHIU, STEPHEN K  [facility] 10/25/2014, 3:57 PM

## 2014-10-25 NOTE — Progress Notes (Signed)
ANTIBIOTIC CONSULT NOTE  Pharmacy Consult for Zosyn Indication: pneumonia  Allergies  Allergen Reactions  . Onion Other (See Comments)    Indigestion   Patient Measurements: Height:  (167.6 cm) Weight: 143 lb 14.4 oz (65.273 kg) IBW/kg (Calculated) : 63.8  Vital Signs: Temp: 98.9 F (37.2 C) (03/16 0504) Temp Source: Oral (03/16 0504) BP: 127/62 mmHg (03/16 0504) Pulse Rate: 68 (03/16 0504) Intake/Output from previous day: 03/15 0701 - 03/16 0700 In: 796.7 [P.O.:540; I.V.:106.7; IV Piggyback:150] Out: 1000 [Urine:1000] Intake/Output from this shift:    Labs:  Recent Labs  10/23/14 0555 10/24/14 0556 10/25/14 0506  WBC 6.4  --   --   HGB 11.0*  --   --   PLT 220  --   --   CREATININE 0.68 0.64 0.70   Estimated Creatinine Clearance: 64.2 mL/min (by C-G formula based on Cr of 0.7).  Recent Labs  10/25/14 0506  VANCOTROUGH 8.7*     Microbiology: Recent Results (from the past 720 hour(s))  Blood Culture (routine x 2)     Status: None (Preliminary result)   Collection Time: 10/21/14  5:53 PM  Result Value Ref Range Status   Specimen Description BLOOD LEFT ANTECUBITAL  Final   Special Requests BOTTLES DRAWN AEROBIC AND ANAEROBIC 7CC  Final   Culture NO GROWTH 3 DAYS  Final   Report Status PENDING  Incomplete  Blood Culture (routine x 2)     Status: None (Preliminary result)   Collection Time: 10/21/14  5:58 PM  Result Value Ref Range Status   Specimen Description BLOOD RIGHT ANTECUBITAL  Final   Special Requests BOTTLES DRAWN AEROBIC AND ANAEROBIC 6CC  Final   Culture NO GROWTH 3 DAYS  Final   Report Status PENDING  Incomplete  Urine culture     Status: None   Collection Time: 10/22/14  9:32 AM  Result Value Ref Range Status   Specimen Description URINE, CLEAN CATCH  Final   Special Requests NONE  Final   Colony Count NO GROWTH Performed at [facility]   Final   Culture NO GROWTH Performed at [facility]   Final   Report  Status 10/23/2014 FINAL  Final    Anti-infectives    Start     Dose/Rate Route Frequency Ordered Stop   10/22/14 1800  ceFEPIme (MAXIPIME) 2 g in dextrose 5 % 50 mL IVPB  Status:  Discontinued     2 g 100 mL/hr over 30 Minutes Intravenous Every 24 hours 10/21/14 2051 10/22/14 1318   10/22/14 1400  piperacillin-tazobactam (ZOSYN) IVPB 3.375 g     3.375 g 12.5 mL/hr over 240 Minutes Intravenous 3 times per day 10/22/14 1350     10/22/14 0600  vancomycin (VANCOCIN) 500 mg in sodium chloride 0.9 % 100 mL IVPB  Status:  Discontinued     500 mg 100 mL/hr over 60 Minutes Intravenous Every 12 hours 10/21/14 2051 10/24/14 1724   10/21/14 1800  ceFEPIme (MAXIPIME) 2 g in dextrose 5 % 50 mL IVPB     2 g 100 mL/hr over 30 Minutes Intravenous  Once 10/21/14 1748 10/21/14 2008   10/21/14 1800  vancomycin (VANCOCIN) IVPB 1000 mg/200 mL premix     1,000 mg 200 mL/hr over 60 Minutes Intravenous  Once 10/21/14 1748 10/21/14 2008      Assessment: 84 yoM started on empiric antibiotics for PNA. Cx data negative.  These have been narrowed to Zosyn for presumed aspiration PNA.  Patient is  clinically improved.  MBSS planned for today.  Noted plans to change to Augmentin if ok.   Goal of Therapy:  Eradicate infection.   Plan:  Zosyn 3.375gm IV every 8 hours Follow-up micro data, labs, vitals Duration of therapy per MD  Elson ClanLilliston, Laurie Lovejoy Michelle 10/25/2014,8:49 AM

## 2014-10-25 NOTE — Progress Notes (Signed)
Speech Language Pathology Treatment: Dysphagia  Patient Details Name: Chad Everett MRN: [id number] DOB: [birth year] Today's Date: 10/25/2014 Time: [id number] SLP Time Calculation (min) (ACUTE ONLY): 26 min  Assessment / Plan / Recommendation Clinical Impression  [name] was seen at bedside with lunch meal (puree and NTL). Unfortunately, the fluoroscopy machine is still broken and they are servicing it still. It would be fine to complete MBSS as an outpatient if pt is to be discharged. Information left at bedside with phone number for follow up. Will alert case manager to request order for MBSS. Pt tolerated mech soft and thin liquids without incident. He had one episode of delayed cough. Recommend upgrade to D3/mech soft and thin liquids. Pills whole or crushed in puree. Aspiration precautions. I spoke with family on the telephone and they are in agreement with plan. They did voice concerns about pt not having a BM and wanted to know if repeat chest xray done. These questions were deferred to RN. PLEASE ORDER outpatient MBSSS   HPI HPI: 81 yr old male who has a past medical history of Dementia; GERD (gastroesophageal reflux disease); Anxiety; and Pneumonia. Today was brought to the ED for worsening lethargy and generalized weakness. Patient was [facility] yesterday after he was therefore shortness of breath. As per family patient has been sleeping most of the time, and has been weak more than usual. Patient is unable to provide any history at this time. He has a history of dementia.   Pertinent Vitals Pain Assessment: No/denies pain  SLP Plan  Other (Comment) (Arrange outpatient MBSS since machine is broken)    Recommendations Diet recommendations: Dysphagia 3 (mechanical soft);Thin liquid Liquids provided via: Cup Medication Administration: Crushed with puree Supervision: Full supervision/cueing for compensatory strategies Compensations: Slow rate;Small  sips/bites;Check for pocketing;Multiple dry swallows after each bite/sip Postural Changes and/or Swallow Maneuvers: Out of bed for meals;Seated upright 90 degrees;Upright 30-60 min after meal              Oral Care Recommendations: Oral care BID Follow up Recommendations: Outpatient SLP (outpatient MBSS) Plan: Other (Comment) (Arrange outpatient MBSS since machine is broken)   Thank you,  Chad Everett, CCC-SLP [phone]      Chad Everett 10/25/2014, 3:19 PM

## 2014-10-26 NOTE — Progress Notes (Signed)
UR chart review completed.  

## 2014-10-27 LAB — CULTURE, BLOOD (ROUTINE X 2)
Culture: NO GROWTH
Culture: NO GROWTH

## 2014-10-31 ENCOUNTER — Ambulatory Visit (HOSPITAL_COMMUNITY): Payer: Medicare Other | Attending: Internal Medicine | Admitting: Speech Pathology

## 2014-10-31 ENCOUNTER — Ambulatory Visit (HOSPITAL_COMMUNITY)
Admit: 2014-10-31 | Discharge: 2014-10-31 | Disposition: A | Discharge status: Home / Self Care | Payer: Medicare Other | Source: Ambulatory Visit | Attending: Internal Medicine | Admitting: Internal Medicine

## 2014-10-31 DIAGNOSIS — R0902 Hypoxemia: Secondary | ICD-10-CM

## 2014-10-31 DIAGNOSIS — R1310 Dysphagia, unspecified: Secondary | ICD-10-CM | POA: Diagnosis not present

## 2014-10-31 DIAGNOSIS — G9340 Encephalopathy, unspecified: Secondary | ICD-10-CM

## 2014-10-31 DIAGNOSIS — J690 Pneumonitis due to inhalation of food and vomit: Secondary | ICD-10-CM

## 2014-10-31 DIAGNOSIS — J9601 Acute respiratory failure with hypoxia: Secondary | ICD-10-CM

## 2014-10-31 DIAGNOSIS — F0390 Unspecified dementia without behavioral disturbance: Secondary | ICD-10-CM

## 2014-10-31 DIAGNOSIS — R1314 Dysphagia, pharyngoesophageal phase: Secondary | ICD-10-CM

## 2014-10-31 NOTE — Therapy (Signed)
[facility] [address] [place], [place], [address] Phone: [phone]   Fax:  [phone]  Modified Barium Swallow  Patient Details  Name: Chad Everett MRN: [id number] Date of Birth: 12-13-1930 Referring Provider:  Jerald Kiefhiu, Stephen K, MD  Encounter Date: 10/31/2014      End of Session - 10/31/14 1625    Visit Number 1   Number of Visits 1   Authorization Type Medicare   SLP Start Time 1310   SLP Stop Time  1342   SLP Time Calculation (min) 32 min   Activity Tolerance Patient tolerated treatment well      Past Medical History  Diagnosis Date  . Dementia   . GERD (gastroesophageal reflux disease)   . Anxiety   . Pneumonia     No past surgical history on file.  There were no vitals filed for this visit.  Visit Diagnosis: Dysphagia, pharyngoesophageal phase      Subjective Assessment - 10/31/14 1513    Symptoms Pt with occasional choking episodes during meals   Special Tests MBSS   Currently in Pain? No/denies             General - 10/31/14 1514    General Information   Date of Onset 10/21/14   HPI Chad Everett is an 81 yr old male whohas a past medical history of Dementia; GERD (gastroesophageal reflux disease); Anxiety; and Pneumonia. [facility] week duirng his acute admission and MBSS was recommended due to suspected aspiration events. The study was not completed during his inpatient stay due to equipment malfunction in radiology so the test was scheduled as an outpatient.  He has a history of dementia. Family reports that [name] was recently seen at [facility] for P[facility] and had a choking episode the day of his discharge which required the Heimlich maneuver. He lives with his grandaughter, Chad SnowballShana, who accompanied him to today's appointment. She reports that he is doing well with his swallowing at home currently.    Type of Study Modified Barium Swallowing Study   Reason for Referral  Objectively evaluate swallowing function   Previous Swallow Assessment BSE D2/thin March 2016   Diet Prior to this Study Dysphagia 1 (puree);Thin liquids   Temperature Spikes Noted No   Respiratory Status Room air   History of Recent Intubation No   Behavior/Cognition Alert;Cooperative;Pleasant mood;Hard of hearing   Oral Cavity - Dentition Dentures, top   Oral Motor / Sensory Function Within functional limits   Self-Feeding Abilities Able to feed self   Patient Positioning Upright in chair   Baseline Vocal Quality Clear   Volitional Cough Strong   Volitional Swallow Able to elicit   Anatomy Within functional limits   Pharyngeal Secretions Not observed secondary MBS            Oral Preparation/Oral Phase - 10/31/14 1516    Oral Preparation/Oral Phase   Oral Phase Impaired   Oral - Solids   Oral - Mechanical Soft Impaired mastication   Oral Phase - Comment   Oral Phase - Comment incomplete mastication of solid   Electrical stimulation - Oral Phase   Was Electrical Stimulation Used No          Pharyngeal Phase - 10/31/14 1517    Pharyngeal Phase   Pharyngeal Phase Impaired   Pharyngeal - Thin   Pharyngeal - Thin Cup Delayed swallow initiation;Premature spillage to valleculae;Lateral channel residue;Pharyngeal residue - pyriform sinuses  Pharyngeal - Thin Straw Delayed swallow initiation;Premature spillage to pyriform sinuses;Penetration/Aspiration during swallow;Lateral channel residue;Pharyngeal residue - pyriform sinuses   Penetration/Aspiration details (thin straw) Material does not enter airway;Material enters airway, remains ABOVE vocal cords then ejected out   Pharyngeal - Solids   Pharyngeal - Puree Within functional limits   Pharyngeal - Mechanical Soft Within functional limits   Pharyngeal - Pill Within functional limits   Electrical Stimulation - Pharyngeal Phase   Was Electrical Stimulation Used No          Cricopharyngeal Phase - Nov 28, 2014 1518     Cervical Esophageal Phase   Cervical Esophageal Phase Impaired   Cervical Esophageal Phase - Thin   Thin Cup Prominent cricopharyngeal segment                  Plan - 11/28/2014 1627    Clinical Impression Statement Chad Everett presents with a mild oropharyngeal phase dysphagia characterized by incomplete mastication of solids likely due upper dentures and no lower dentition, delay in swallow initiation triggering after filling the valleculae and spilling to pyriforms with thin liquids, and mild decreased hyolaryngeal excursion resulting in one episode of trace flash penetration when pt taking sequential straw sips and mild residuals of thins in lateral channels and pyriforms (slightly greater on left side) post swallow. Pt required occasional cues to repeat/dry swallow to clear residuals which was effective in reducing residuals. Esophageal sweep was unremarkable except for prominent CP. Recommend D3/mech soft and thin liquids with cues to repeat/dry swallow after each presentation; avoid separate pieces of food and combine with other textures (for example mix peas into mashed potatoes). Pt with significant memory impairment and will need supervision with meals. Granddaughter acknowledges need for supervision and results reviewed with her. No further SLP services indicated at this time.           G-Codes - 11/28/2014 1639    Functional Assessment Tool Used MBSS, clinical judgment   Functional Limitations Swallowing   Swallow Current Status (R6045) At least 1 percent but less than 20 percent impaired, limited or restricted   Swallow Goal Status (W0981) At least 1 percent but less than 20 percent impaired, limited or restricted   Swallow Discharge Status [phone]) At least 1 percent but less than 20 percent impaired, limited or restricted          Recommendations/Treatment - 11/28/14 1623    Swallow Evaluation Recommendations   Diet Recommendations Dysphagia 3 (Mechanical Soft);Thin  liquid   Liquid Administration via Cup;Straw   Medication Administration Whole meds with puree   Supervision Full supervision/cueing for compensatory strategies   Compensations Slow rate;Small sips/bites;Check for pocketing;Multiple dry swallows after each bite/sip   Postural Changes and/or Swallow Maneuvers Out of bed for meals;Seated upright 90 degrees;Upright 30-60 min after meal   Oral Care Recommendations Oral care BID   Other Recommendations Clarify dietary restrictions   Follow up Recommendations None          Prognosis - 2014/11/28 1624    Prognosis   Prognosis for Safe Diet Advancement Good   Barriers to Reach Goals Cognitive deficits   Individuals Consulted   Consulted and Agree with Results and Recommendations Patient;Family member/caregiver   Family Member Chad Everett, granddaughter   Report Sent to  Referring physician  PCP [facility]      Problem List Patient Active Problem List   Diagnosis Date Noted  . Encephalopathy 10/22/2014  . Acute dyspnea 10/21/2014  . HCAP (healthcare-associated pneumonia) 10/21/2014  .  Diastolic heart failure 10/21/2014  . Aspiration pneumonia 08/13/2014  . COPD (chronic obstructive pulmonary disease) 08/13/2014  . Acute respiratory failure with hypoxia 08/13/2014  . Hypokalemia 08/13/2014  . Chronic diastolic congestive heart failure 08/13/2014  . Weakness 08/11/2014  . Near syncope 05/17/2013  . Syncope 05/17/2013  . Dementia   . GERD (gastroesophageal reflux disease)   . Anxiety    Thank you,  Chad Everett, CCC-SLP [phone]   [facility] 10/31/2014, 4:40 PM  [facility] [address] [place], [place], [address] Phone: [phone]   Fax:  [phone]

## 2014-11-08 ENCOUNTER — Encounter (HOSPITAL_COMMUNITY): Payer: Medicare Other | Admitting: Speech Pathology

## 2014-11-28 NOTE — Discharge Summary (Signed)
PATIENT NAME:  Chad Everett, Chad Everett MR#:  [id number] DATE OF BIRTH:  February 12, 1931  DATE OF ADMISSION:  02/14/2012 DATE OF DISCHARGE:  02/17/2012  DIAGNOSES:  1. Chronic obstructive pulmonary disease. 2. History of tobacco abuse.  3. Abdominal pain. 4. Acute cholecystitis with cholelithiasis.   PROCEDURES: None.   HISTORY OF PRESENT ILLNESS: This is a 81 year old demented patient. It is my understanding he is on Hospice for his dementia but he comes to the hospital with severe right upper quadrant pain and a single episode of emesis. Work-up showed probable acute cholecystitis. He had had no jaundice or acholic stools. After a long discussion with the family about surgical intervention versus hospitalization and conservative management, it was decided to place the patient in the hospital, hydrate, and give him pain medications and antibiotics. This was done and the patient was re-examined daily during his hospitalization. The plan was that if he did not improve rather than consider surgical intervention, cholecystostomy tube may be necessary. This patient did improve on antibiotics and hydration and pain medications. He had no further nausea or emesis and his diet was advanced to a low fat diet. Extensive discussion with the family concerning options were discussed and they were in agreement with nonoperative management in this patient. He was discharged in stable condition on antibiotics and analgesics to follow-up in my office in 10 days.  ____________________________ Chad Salvageichard E. Excell Seltzerooper, MD rec:drc D: 02/28/2012 09:20:59 ET T: 03/01/2012 10:26:42 ET JOB#: [id number]  cc: Chad Salvageichard E. Excell Seltzerooper, MD, <Dictator> Chad HawICHARD E Chad Didonato MD ELECTRONICALLY SIGNED 03/01/2012 11:37

## 2014-12-03 NOTE — H&P (Signed)
PATIENT NAME:  Chad Everett, Chad Everett MR#:  [id number] DATE OF BIRTH:  12-17-1930  DATE OF ADMISSION:  02/14/2012  CHIEF COMPLAINT: Epigastric pain.   HISTORY OF PRESENT ILLNESS: This is a 81 year old demented patient. He lives at home, and his daughter-in-law is his primary caregiver, who was present for this interview. The patient is unable to give a complete history or virtually any history; and, in fact, it is difficult giving his symptoms, so most if not all of the history is obtained from the daughter-in-law who, again, is the primary caregiver.   She describes the patient doing perfectly okay and fine yesterday, but then this morning he started having abdominal pain; and the patient points to his epigastrium as the area of chief complaint, but apparently he has told the Emergency Room physicians that it was all over his belly. For me, he points mostly to the epigastrium. He has had some nausea, had not vomited until after having CT contrast provided to him at which time he vomited once, but he was able to complete the CT scan and has had a subsequent ultrasound suggesting acute cholecystitis.   The patient denies jaundice or acholic stools. A complete review of systems is difficult, if not impossible to obtain from him and his caregiver daughter-in-law; but she states he has had no fevers or chills, is frequently constipated and has never had an episode like this before.   PAST MEDICAL HISTORY: Dementia. Denies hypertension, cardiac disease or lung disease, although he is an ex-smoker.   PAST SURGICAL HISTORY: Multiple eye surgeries, including corneal implant. Denies abdominal surgeries.   ALLERGIES: Codeine and Zithromax.   MEDICATIONS: Multiple, including Risperdal and Klonopin.   FAMILY HISTORY: Noncontributory.   SOCIAL HISTORY: The patient lives at home with his son and daughter-in-law, does not drink alcohol and is an ex-smoker, has not smoked in many years.  REVIEW OF SYSTEMS: Review of  systems is not obtainable.   PHYSICAL EXAMINATION:  GENERAL: A disheveled, cachectic-appearing Caucasian male patient.   VITAL SIGNS: His BMI is 20, temperature of 99.5, pulse 74, respirations 20, blood pressure 147/65. Pain scale of 1 and 99% room air saturation.   HEENT: Poor dentition. Abnormal left eye. No scleral icterus.  NECK: No palpable neck nodes.    CHEST: Bilateral rhonchi, more on the right than on the left.   CARDIAC: Regular rate and rhythm.   ABDOMEN: Soft. He is tender in the epigastrium and right upper quadrant with a positive Murphy's sign. No scars are noted.   EXTREMITIES: Without edema, but there is clubbing of his fingers.   NEUROLOGIC: A patient is confused and demented.   LABORATORY, DIAGNOSTIC AND RADIOLOGICAL DATA:  Laboratory values demonstrate an elevated white blood cell count with a normal hemoglobin and hematocrit. Liver function tests are within normal limits.  Electrolytes show a potassium of 3.3, otherwise normal.  Chest shows no acute disease.  CT scan suggests acute cholecystitis with gallbladder wall thickening and ultrasound confirms.   ASSESSMENT AND PLAN: This is a patient with probable acute cholecystitis. He is a poor surgical candidate. Additional history is that he is on Hospice for his dementia, and I spoke to the daughter-in-law who is the caregiver and then to his son by telephone concerning my plans for admission to the hospital, IV antibiotics, hydration, and treating his pulmonary condition, which is likely long-standing chronic obstructive pulmonary disease, with Ventolin inhalers to try to improve his pulmonary performance, and then reassess for possible surgical intervention versus cholecystostomy  versus IV antibiotics only.   This was discussed with the family, and they were in agreement with this plan.   ____________________________ Adah Salvage. Excell Seltzer, MD rec:cbb D: 02/14/2012 01:42:36 ET T: 02/14/2012 11:04:42  ET JOB#: [id number]  cc: Adah Salvage. Excell Seltzer, MD, <Dictator> Lattie Haw MD ELECTRONICALLY SIGNED 02/14/2012 23:45

## 2014-12-03 NOTE — H&P (Signed)
Subjective/Chief Complaint epig pain    History of Present Illness one day epig pain, nausea. no prior episode. pt is on Hospice for dementia but caregiver gives history. vomited afeter CT contrast once. no f/c. frequently constipated. 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:   ALLERGIES:  Zithromax: N/V  Codeine: N/V  Family and Social History:   Family History Non-Contributory    Social History negative tobacco, negative ETOH, ex smoker    Place of Living Home   Review of Systems:   ROS Pt not able to provide ROS    Medications/Allergies Reviewed Medications/Allergies reviewed   Physical Exam:   GEN cachectic, thin, disheveled    HEENT pink conjunctivae, poor dentition    NECK supple    RESP normal resp effort  no use of accessory muscles  rhonchi    CARD regular rate    ABD positive tenderness  soft  RUQ, Murphy's sign    LYMPH negative neck    EXTR positive cyanosis/clubbing, negative edema    SKIN normal to palpation    PSYCH alert    Additional Comments Hx from daughter in law, caregiver   Lab Results: Hepatic:  05-Jul-13 19:56    Bilirubin, Total 1.0   Alkaline Phosphatase 83   SGPT (ALT) 15 (12-78 NOTE: NEW REFERENCE RANGE 07/04/2011)   SGOT (AST) 17   Total Protein, Serum 8.1   Albumin, Serum 3.7  Routine Chem:  05-Jul-13 19:56    Glucose, Serum  132   BUN  6   Creatinine (comp) 0.90   Sodium, Serum 140   Potassium, Serum  3.3   Chloride, Serum 100   CO2, Serum 32   Calcium (Total), Serum 9.3   Osmolality (calc) 279   eGFR (African American) >60   eGFR (Non-African American) >60 (eGFR values <15m/min/1.73 m2 may be an indication of chronic kidney disease (CKD). Calculated eGFR is useful in patients with stable renal function. The eGFR calculation will not be  reliable in acutely ill patients when serum creatinine is changing rapidly. It is not useful in  patients on dialysis. The eGFR calculation may not be applicable to patients at the low and high extremes of body sizes, pregnant women, and vegetarians.)   Anion Gap 8   Lipase 123 (Result(s) reported on 13 Feb 2012 at 08:53PM.)  Routine UA:  05-Jul-13 19:56    Color (UA) Amber   Clarity (UA) Hazy   Glucose (UA) 150 mg/dL   Bilirubin (UA) Negative   Ketones (UA) Trace   Specific Gravity (UA) 1.016   Blood (UA) Negative   pH (UA) 7.0   Protein (UA) 100 mg/dL   Nitrite (UA) Negative   Leukocyte Esterase (UA) Negative (Result(s) reported on 13 Feb 2012 at 08:54PM.)   RBC (UA) 8 /HPF   WBC (UA) <1 /HPF   Bacteria (UA) NONE SEEN   Epithelial Cells (UA) <1 /HPF   Mucous (UA) PRESENT   Amorphous Crystal (UA) PRESENT (Result(s) reported on 13 Feb 2012 at 08:54PM.)  Routine Hem:  05-Jul-13 19:56    WBC (CBC)  11.9   RBC (CBC) 4.62   Hemoglobin (CBC) 14.1   Hematocrit (CBC) 42.9   Platelet Count (CBC) 267 (Result(s) reported on 13 Feb 2012 at 08:44PM.)   MCV 93   MCH 30.4  MCHC 32.8   RDW 13.8   Radiology Results: XRay:    05-Jul-13 20:43, Chest PA and Lateral   Chest PA and Lateral   REASON FOR EXAM:    pain  COMMENTS:       PROCEDURE: DXR - DXR CHEST PA (OR AP) AND LATERAL  - Feb 13 2012  8:43PM     RESULT: Comparison: 09/29/2011    Findings:     PA and lateral chest radiographs are provided.  There is no focal   parenchymal opacity, pleural effusion, or pneumothorax. The heart and   mediastinum are unremarkable.  The osseous structures are unremarkable.    IMPRESSION:     No acute disease of the chest.    Dictation Site: 1          Verified By: Jennette Banker, M.D., MD  CT:    05-Jul-13 22:10, CT Abdomen and Pelvis With Contrast   CT Abdomen and Pelvis With Contrast   REASON FOR EXAM:    (1) pain diffuse; (2) pain diffuse  COMMENTS:   May transport without  cardiac monitor    PROCEDURE: CT  - CT ABDOMEN / PELVIS  W  - Feb 13 2012 10:10PM     RESULT: History: Diffuse abdominal pain    Comparison:  None    Technique:Multiple axial images of the abdomen and pelvis were performed   from the lung bases to the pubic symphysis, with p.o. contrast and with   100 ml of Isovue 300 intravenous contrast.    Findings:    The lung bases are clear. There is no pneumothorax. The heart size is   normal.     The liver demonstrates no focal abnormality. There is no intrahepatic or   extrahepatic biliary ductal dilatation. There is severe gallbladder wall   thickening. There is cholelithiasis. The spleen demonstrates no focal  abnormality. The kidneys, adrenal glands, and pancreas are normal. The   bladder is unremarkable.     There are air-fluid levels within the colon. There is no bowel   dilatation. There are fluid levels within the small bowel. There is no   pneumoperitoneum, pneumatosis, or portal venous gas. There is no   abdominal or pelvic free fluid. There is no lymphadenopathy.     The abdominal aorta is normal in caliber with atherosclerosis.  The osseous structures are unremarkable.    IMPRESSION:     1. Cholelithiasis with severe gallbladder wall thickening most concerning   for acute cholecystitis.    2. Colonic and small bowel air-fluid level which may reflect an ileus   versus enterocolitis.    Dictation Site: 1          Verified By: Jennette Banker, M.D., MD     Assessment/Admission Diagnosis U/s confirms stones and thickening discussed with son by phone plan admit, hydrate IV abx  pulm care and reassess for surgical appropriateness.   Electronic Signatures: Florene Glen (MD)  (Signed 06-Jul-13 01:26)  Authored: CHIEF COMPLAINT and HISTORY, PAST MEDICAL/SURGIAL HISTORY, ALLERGIES, FAMILY AND SOCIAL HISTORY, REVIEW OF SYSTEMS, PHYSICAL EXAM, LABS, Radiology, ASSESSMENT AND PLAN   Last Updated: 06-Jul-13 01:26 by  Florene Glen (MD)

## 2014-12-10 NOTE — Discharge Summary (Signed)
PATIENT NAME:  Chad Everett, Chad Everett MR#:  [id number] DATE OF BIRTH:  February 04, 1931  DATE OF ADMISSION:  10/18/2014 DATE OF DISCHARGE:  10/20/2014  For a detailed note, please see history and physical done on admission by Dr. Angelica Ranavid Hower.   DIAGNOSES AT DISCHARGE: Sepsis secondary to pneumonia, pneumonia likely community-acquired, advanced dementia, GERD, anxiety/depression.   DIET: The patient is being discharged on a mechanical soft diet with aspiration precautions and medications given puree.   ACTIVITY: As tolerated. The patient is being discharged with home health physical therapy and nursing services.   DISCHARGE MEDICATIONS: Aspirin 81 mg daily, ranitidine 150 mg daily, Klonopin 1 mg every 8 hours as needed, Tylenol with hydrocodone 5/325 mg 1 to 2 tabs every 4 hours as needed, Risperdal 3 mg at bedtime, Zoloft 25 mg at bedtime, Senokot 2 tabs b.i.d., albuterol inhaler 4 puffs every 4 hours as needed, Levaquin 750 mg daily for 7 days and prednisone taper starting at 50 mg, going down to 10 mg over the next 5 days.   CONSULTANTS DURING THE HOSPITAL COURSE: None.   PERTINENT STUDIES DONE DURING THE HOSPITAL COURSE: A CT scan of the head done without contrast showing no acute intracranial abnormalities, chronic atrophy, small vessel ischemic changes. CT chest without contrast showing bilateral peribronchial thickening with focal parenchymal disease in the left upper lobe, patchy nodular densities in both lower lobes, findings most compatible with acute infectious or inflammatory process. No evidence of pleural effusions.   HOSPITAL COURSE: This is an 81 year old male with medical problems as mentioned above, presented to the hospital with a fever of 103, shortness of breath and findings suggestive of pneumonia.    1. Sepsis. The patient presented to the hospital with a fever of 103, tachycardia. Noted to be hypoxic with CT chest findings suggestive of pneumonia. The patient was started on IV Levaquin,  maintained on that for 24 hours. The patient's fever has now resolved. He is hemodynamically stable. He has no shortness of breath. He had some mild bronchospasm due to his pneumonia. He was given some IV Solu-Medrol. He is being discharged on oral prednisone now. His blood cultures remained negative. At this point, since he is clinically doing better, he is being discharged on oral antibiotics as stated above.  2. Pneumonia. This was likely the cause of the patient's sepsis. The patient was treated with IV Levaquin and is currently being discharged on oral Levaquin. He is currently afebrile and hemodynamically stable. Blood cultures are negative. 3. A choking episode/suspected aspiration. The patient did have a choking episode on the morning of 10/20/2014, underwent a Heimlich maneuver, which improved his symptoms. A speech evaluation was obtained. The patient was seen by then and they recommended continuing to mechanical soft diet and placing him on aspiration precautions, which he is being discharged on presently.  4. Gastroesophageal reflux disease. The patient was maintained on his H2-blockers and he will continue that.  5. Anxiety/depression. The patient was maintained on his Klonopin, Risperdal he will resume that.   6. Hypokalemia. This was supplemented and has since then resolved.   CODE STATUS: The patient is a full code.   DISPOSITION: He is being discharged home with home health physical therapy and nursing services.   TIME SPENT: 40 minutes.  ____________________________ Rolly PancakeVivek J. Cherlynn KaiserSainani, MD vjs:ap D: 10/20/2014 14:58:25 ET T: 10/20/2014 22:15:57 ET JOB#: [id number]  cc: Rolly PancakeVivek J. Cherlynn KaiserSainani, MD, <Dictator> Houston SirenVIVEK J SAINANI MD ELECTRONICALLY SIGNED 10/23/2014 14:36

## 2014-12-10 NOTE — H&P (Signed)
PATIENT NAME:  Chad Everett, Chad Everett MR#:  [id number] DATE OF BIRTH:  1931/01/15  DATE OF ADMISSION:  10/18/2014  REFERRING PHYSICIAN: Kandee Keen R. York Cerise, MD   PRIMARY CARE PHYSICIAN: Rhona Leavens. Burnett Sheng, MD  CHIEF COMPLAINT: Shortness of breath, cough.  HISTORY OF PRESENT ILLNESS: An 81 year old Caucasian gentleman with history of dementia, hypertension essential, gastroesophageal reflux disease without esophagitis, presenting with shortness of breath. He is unable to provide any meaningful information, given mental status at baseline. History obtained from ER staff as well as family members who were present, Dr. Gavin Potters. History obtained from ER staff, 1 to 2 day duration cough with associated shortness of breath, symptoms were more pronounced today, thus brought to the hospital for further workup and evaluation. In the Emergency Department, noted to be febrile, temperature of 104 degrees Fahrenheit, tachycardic heart rate of 120s. The patient denies any active complaints, however, he is actively coughing, he states for "a while."   REVIEW OF SYSTEMS: Unobtainable, given the patient's mental status and medical condition.   PAST MEDICAL HISTORY: Includes dementia without behavioral disturbances, hypertension, essential, gastroesophageal reflux disease without esophagitis.   SOCIAL HISTORY: Unable to obtain, given the patient's mental status and medical condition.   FAMILY HISTORY: Unable to obtain, given the patient's mental status and medical condition.   ALLERGIES: CODEINE AS WELL AS ZITHROMAX.   HOME MEDICATIONS: Include acetaminophen/hydrocodone 325/5 mg 1 tablet every 4 hours as needed for pain, aspirin 81 mg p.o. daily, clonazepam 1 mg p.o. q. 8 hours, sertraline 25 mg p.o. at bedtime, risperidone 3 mg p.o. at bedtime, albuterol 90 mcg inhalation 4 puffs every 4 hours as needed for coughing, ranitidine 150 mg p.o. daily, senna plus 2 tablets b.i.d.   PHYSICAL EXAMINATION:  VITAL SIGNS: Temperature  104.9 degrees Fahrenheit, heart rate 115, respirations 22, blood pressure 170/80, saturating 93%, room air, however, does desaturate to the high 80s. Weight 49.9 kg, BMI 17.8.  GENERAL: Frail-appearing Caucasian gentleman, currently in no acute distress.  HEAD: Normocephalic, atraumatic.  EYES: Pupils equal, round, reactive to light. Extraocular muscles intact. No scleral icterus.  MOUTH: Dry mucosal membrane. Dentition intact. No abscess noted.  EARS, NOSE, THROAT: Clear without exudates. No external lesions.  NECK: Supple. No thyromegaly. No nodules. No JVD.  PULMONARY: Diffuse coarse breath sounds, the left worse than the right, with scant expiratory wheezing over the left lung field; there once again however, both lung fields have a marked prominent on the left. No use of accessory muscles, though tachypneic.  CHEST: Nontender to palpation.  CARDIOVASCULAR: S1, S2, tachycardic. No murmurs, rubs, or gallops. No edema. Pedal pulses 2+ bilaterally.  GASTROINTESTINAL: Soft, nontender, nondistended. No masses. Positive bowel sounds. No hepatosplenomegaly.  MUSCULOSKELETAL: No swelling, clubbing, or edema. Range of motion full in all extremities.  NEUROLOGIC: Cranial nerves II-XII intact. No gross focal neurological deficits. Sensation intact. Reflexes intact.  SKIN: No ulceration, lesions, rashes, or cyanosis. Skin warm, dry. Turgor intact.  PSYCHIATRIC: Mood and affect blunted. He is awake, alert, oriented to person only. When asked where he was, he simply had no response. When asked what year it was, he said about a year ago. Insight and judgment poor.   LABORATORY DATA: Chest x-ray performed reveals no acute disease. CT head performed, which reveals no acute intracranial process. CT chest, performed which reveals bilateral peribronchial thickening with focal parenchymal disease in the left upper lobe as well as patchy nodular densities in the left lower lobe consistent with infection.   Remainder  of laboratory  data: Sodium of 140, potassium 3.1, chloride 102, bicarbonate 31, BUN of 13, creatinine 0.87 glucose of 153. Troponin 0.03. WBC of 4.7, hemoglobin 12.1, platelets of 164,000. Urinalysis within normal limits. No evidence of infection.   ASSESSMENT AND PLAN: An 81 year old Caucasian gentleman with a history of dementia as well as essential hypertension, gastroesophageal reflux disease without esophagitis, presenting with shortness of breath and cough.  1.  Sepsis. Meeting septic criteria by heart rate and temperature secondary to community-acquired pneumonia. Place on Levaquin for antibiotic coverage, culture data performed including blood and sputum. Supplemental O2 to keep SaO2 greater than 92%. DuoNeb treatments q. 4 hours as well as Solu-Medrol 60 mg IV daily. IV fluid hydration to keep mean arterial pressure greater than 65.  2.  Gastroesophageal reflux disease without esophagitis: Continue the H2-blockers.  3.  Anxiety and depression, not otherwise specified, continue with Klonopin, Risperidone as well as Zoloft.  4.  Venous thromboembolism prophylaxis.  5.  Hypokalemia. Replace potassium to goal of 4 to 5.  6.  Venous thromboembolism prophylaxis with heparin subcutaneous.   The patient a full code.  TIME SPENT: Thirty-five minutes.   ____________________________ Cletis Athensavid K. Hower, MD dkh:TM D: 10/19/2014 00:10:16 ET T: 10/19/2014 00:48:09 ET JOB#: [id number]  cc: Cletis Athensavid K. Hower, MD, <Dictator> DAVID Synetta ShadowK HOWER MD ELECTRONICALLY SIGNED 10/19/2014 20:37

## 2016-06-24 ENCOUNTER — Emergency Department (HOSPITAL_COMMUNITY): Payer: Medicare Other

## 2016-06-24 ENCOUNTER — Emergency Department (HOSPITAL_COMMUNITY)
Admission: EM | Admit: 2016-06-24 | Discharge: 2016-06-24 | Disposition: A | Discharge status: Home / Self Care | Payer: Medicare Other | Attending: Emergency Medicine | Admitting: Emergency Medicine

## 2016-06-24 ENCOUNTER — Encounter (HOSPITAL_COMMUNITY): Payer: Self-pay

## 2016-06-24 DIAGNOSIS — R0989 Other specified symptoms and signs involving the circulatory and respiratory systems: Secondary | ICD-10-CM | POA: Diagnosis present

## 2016-06-24 DIAGNOSIS — J45909 Unspecified asthma, uncomplicated: Secondary | ICD-10-CM | POA: Insufficient documentation

## 2016-06-24 DIAGNOSIS — F0390 Unspecified dementia without behavioral disturbance: Secondary | ICD-10-CM | POA: Insufficient documentation

## 2016-06-24 DIAGNOSIS — J449 Chronic obstructive pulmonary disease, unspecified: Secondary | ICD-10-CM | POA: Insufficient documentation

## 2016-06-24 DIAGNOSIS — Z87891 Personal history of nicotine dependence: Secondary | ICD-10-CM | POA: Insufficient documentation

## 2016-06-24 DIAGNOSIS — I5032 Chronic diastolic (congestive) heart failure: Secondary | ICD-10-CM | POA: Diagnosis not present

## 2016-06-24 DIAGNOSIS — Z79899 Other long term (current) drug therapy: Secondary | ICD-10-CM | POA: Diagnosis not present

## 2016-06-24 DIAGNOSIS — I110 Hypertensive heart disease with heart failure: Secondary | ICD-10-CM | POA: Insufficient documentation

## 2016-06-24 DIAGNOSIS — Z7982 Long term (current) use of aspirin: Secondary | ICD-10-CM | POA: Diagnosis not present

## 2016-06-24 DIAGNOSIS — Z791 Long term (current) use of non-steroidal anti-inflammatories (NSAID): Secondary | ICD-10-CM | POA: Diagnosis not present

## 2016-06-24 HISTORY — DX: Essential (primary) hypertension: I10

## 2016-06-24 HISTORY — DX: Unspecified asthma, uncomplicated: J45.909

## 2016-06-24 LAB — CBC WITH DIFFERENTIAL/PLATELET
Basophils Absolute: 0 10*3/uL (ref 0.0–0.1)
Basophils Relative: 0 %
EOS PCT: 4 %
Eosinophils Absolute: 0.3 10*3/uL (ref 0.0–0.7)
HCT: 40.5 % (ref 39.0–52.0)
Hemoglobin: 13.5 g/dL (ref 13.0–17.0)
LYMPHS ABS: 2 10*3/uL (ref 0.7–4.0)
LYMPHS PCT: 25 %
MCH: 32.1 pg (ref 26.0–34.0)
MCHC: 33.3 g/dL (ref 30.0–36.0)
MCV: 96.4 fL (ref 78.0–100.0)
MONO ABS: 0.9 10*3/uL (ref 0.1–1.0)
MONOS PCT: 10 %
Neutro Abs: 5 10*3/uL (ref 1.7–7.7)
Neutrophils Relative %: 61 %
PLATELETS: 305 10*3/uL (ref 150–400)
RBC: 4.2 MIL/uL — ABNORMAL LOW (ref 4.22–5.81)
RDW: 13.8 % (ref 11.5–15.5)
WBC: 8.3 10*3/uL (ref 4.0–10.5)

## 2016-06-24 LAB — BASIC METABOLIC PANEL
Anion gap: 6 (ref 5–15)
BUN: 14 mg/dL (ref 6–20)
CALCIUM: 8.6 mg/dL — AB (ref 8.9–10.3)
CO2: 28 mmol/L (ref 22–32)
CREATININE: 0.79 mg/dL (ref 0.61–1.24)
Chloride: 103 mmol/L (ref 101–111)
GFR calc Af Amer: >60 mL/min (ref >60)
GLUCOSE: 137 mg/dL — AB (ref 65–99)
Potassium: 3.9 mmol/L (ref 3.5–5.1)
Sodium: 137 mmol/L (ref 135–145)

## 2016-06-24 MED ORDER — ALBUTEROL SULFATE (2.5 MG/3ML) 0.083% IN NEBU
5.0000 mg | INHALATION_SOLUTION | Freq: Once | RESPIRATORY_TRACT | Status: AC
  Administered 2016-06-24: 5 mg via RESPIRATORY_TRACT
  Filled 2016-06-24: qty 6

## 2016-06-24 NOTE — ED Notes (Signed)
RT at bedside.

## 2016-06-24 NOTE — ED Triage Notes (Signed)
Ems reports pt resident of [facility] and got choked while he was drinking something.  Reports since then pt has productive cough and room air o2 sat was 82%.  Sat increased to 99% on NRB.

## 2016-06-24 NOTE — ED Notes (Signed)
RT paged.

## 2016-06-24 NOTE — ED Provider Notes (Signed)
[facility]-EMER[facility]ENCY DEPT Provider Note   CSN: [id number] Arrival date & time: 06/24/16  1333     History   Chief Complaint Chief Complaint  Patient presents with  . Shortness of Breath    HPI Chad Everett is a 82 y.o. male.  He presents for evaluation of a choking episode accompanied by hypoxia. He lives in a nursing care facility and apparently was noted to be choking after drinking something. He is unable to give any history.  Level V caveat- dementia     HPI  Past Medical History:  Diagnosis Date  . Anxiety   . Asthma   . Dementia   . GERD (gastroesophageal reflux disease)   . Hypertension   . Pneumonia     Patient Active Problem List   Diagnosis Date Noted  . Encephalopathy 10/22/2014  . Acute dyspnea 10/21/2014  . HCAP (healthcare-associated pneumonia) 10/21/2014  . Diastolic heart failure (HCC) 10/21/2014  . Aspiration pneumonia (HCC) 08/13/2014  . COPD (chronic obstructive pulmonary disease) (HCC) 08/13/2014  . Acute respiratory failure with hypoxia (HCC) 08/13/2014  . Hypokalemia 08/13/2014  . Chronic diastolic congestive heart failure (HCC) 08/13/2014  . Weakness 08/11/2014  . Near syncope 05/17/2013  . Syncope 05/17/2013  . Dementia   . GERD (gastroesophageal reflux disease)   . Anxiety     History reviewed. No pertinent surgical history.     Home Medications    Prior to Admission medications   Medication Sig Start Date End Date Taking? Authorizing Provider  acetaminophen (TYLENOL) 325 MG tablet Take 650 mg by mouth every 4 (four) hours as needed for moderate pain.   Yes Historical Provider, MD  albuterol (PROVENTIL HFA;VENTOLIN HFA) 108 (90 BASE) MCG/ACT inhaler Inhale 2 puffs into the lungs every 6 (six) hours as needed for wheezing or shortness of breath.   Yes Historical Provider, MD  aspirin EC 81 MG tablet Take 81 mg by mouth every morning.    Yes Historical Provider, MD  omeprazole (PRILOSEC) 20 MG capsule Take 40 mg by mouth daily.     Yes Historical Provider, MD  psyllium (REGULOID) 0.52 g capsule Take 0.52 g by mouth daily.   Yes Historical Provider, MD    Family History Family History  Problem Relation Age of Onset  . Stroke Sister     Social History Social History  Substance Use Topics  . Smoking status: Former Smoker    Packs/day: 0.50    Years: 20.00    Types: Cigarettes    Quit date: 08/12/1983  . Smokeless tobacco: Never Used  . Alcohol use No     Allergies   Onion   Review of Systems Review of Systems  Unable to perform ROS: Dementia     Physical Exam Updated Vital Signs BP 105/80   Pulse 100   Temp 97.7 F (36.5 C) (Oral)   Resp 16   SpO2 97%   Physical Exam  Constitutional: He appears well-developed.  Elderly, frail  HENT:  Head: Normocephalic and atraumatic.  Right Ear: External ear normal.  Left Ear: External ear normal.  Eyes: Conjunctivae and EOM are normal. Pupils are equal, round, and reactive to light.  Neck: Normal range of motion and phonation normal. Neck supple.  No stridor  Cardiovascular: Normal rate, regular rhythm and normal heart sounds.   Pulmonary/Chest: Effort normal. No respiratory distress. He exhibits no bony tenderness.  Scattered rhonchi. No wheezes or rales. No increased work of breathing.  Abdominal: Soft. There is no tenderness.  Musculoskeletal:  Normal range of motion.  Neurological: He is alert. No cranial nerve deficit or sensory deficit. He exhibits normal muscle tone. Coordination normal.  Skin: Skin is warm, dry and intact.  Psychiatric: He has a normal mood and affect. His behavior is normal.  Nursing note and vitals reviewed.    ED Treatments / Results  Labs (all labs ordered are listed, but only abnormal results are displayed) Labs Reviewed  CBC WITH DIFFERENTIAL/PLATELET - Abnormal; Notable for the following:       Result Value   RBC 4.20 (*)    All other components within normal limits  BASIC METABOLIC PANEL - Abnormal; Notable  for the following:    Glucose, Bld 137 (*)    Calcium 8.6 (*)    All other components within normal limits    EKG  EKG Interpretation  Date/Time:  Tuesday June 24 2016 13:42:52 EST Ventricular Rate:  105 PR Interval:    QRS Duration: 91 QT Interval:  337 QTC Calculation: 446 R Axis:   79 Text Interpretation:  Sinus tachycardia Low voltage, precordial leads Nonspecific repol abnormality, diffuse leads Baseline wander in lead(s) I II aVR Since last tracing rate faster Confirmed by Effie ShyWENTZ  MD, Keneshia Tena [phone]([id number]) on 06/24/2016 2:10:35 PM       Radiology Dg Chest Portable 1 View  Result Date: 06/24/2016 CLINICAL DATA:  Shortness of breath. Choking while drinking. Productive cough. EXAM: PORTABLE CHEST 1 VIEW COMPARISON:  10/21/2014 FINDINGS: The cardiomediastinal silhouette is within normal limits. The patient has taken a greater inspiration than on the prior study. No airspace consolidation, edema, pleural effusion, or pneumothorax is identified. No acute osseous abnormality is seen. IMPRESSION: No active disease. Electronically Signed   By: Sebastian AcheAllen  Grady M.D.   On: 06/24/2016 14:04    Procedures Procedures (including critical care time)  Medications Ordered in ED Medications  albuterol (PROVENTIL) (2.5 MG/3ML) 0.083% nebulizer solution 5 mg (5 mg Nebulization Given 06/24/16 1358)     Initial Impression / Assessment and Plan / ED Course  I have reviewed the triage vital signs and the nursing notes.  Pertinent labs & imaging results that were available during my care of the patient were reviewed by me and considered in my medical decision making (see chart for details).  Clinical Course as of Jun 24 1741  Tue Jun 24, 2016  1438 Anion gap: 6 [EW]    Clinical Course User Index [EW] Mancel BaleElliott Javin Nong, MD    Medications  albuterol (PROVENTIL) (2.5 MG/3ML) 0.083% nebulizer solution 5 mg (5 mg Nebulization Given 06/24/16 1358)    Patient Vitals for the past 24 hrs:  BP Temp  Temp src Pulse Resp SpO2  06/24/16 1732 - - - - - 97 %  06/24/16 1646 105/80 - - 100 16 98 %  06/24/16 1554 130/70 - - 95 20 100 %  06/24/16 1401 - - - - - 100 %  06/24/16 1347 180/84 97.7 F (36.5 C) Oral 107 (!) 32 97 %    5:39 PM Reevaluation with update and discussion. After initial assessment and treatment, an updated evaluation reveals He is calm and comfortable. Distal. Oxygen saturation 97% on room air. Cataldo Cosgriff L    Final Clinical Impressions(s) / ED Diagnoses   Final diagnoses:  Choking episode    Evaluation consistent with a choking episode, with transient bronchospasm, which has improved spontaneously. Doubt significant aspiration, metabolic instability or impending vascular collapse.  Nursing Notes Reviewed/ Care Coordinated Applicable Imaging Reviewed Interpretation of Laboratory Data incorporated  into ED treatment  The patient appears reasonably screened and/or stabilized for discharge and I doubt any other medical condition or other [facility] requiring further screening, evaluation, or treatment in the ED at this time prior to discharge.  Plan: Home Medications- continue; Home Treatments- rest; return here if the recommended treatment, does not improve the symptoms; Recommended follow up- PCP prn    New Prescriptions New Prescriptions   No medications on file     Mancel BaleElliott Jaydeen Darley, MD 06/24/16 1742

## 2016-06-24 NOTE — ED Notes (Signed)
[facility] [place] [facility]. States they will have a ride on the way in 5 minutes.

## 2017-11-18 ENCOUNTER — Inpatient Hospital Stay (HOSPITAL_COMMUNITY)
Admission: EM | Admit: 2017-11-18 | Discharge: 2017-11-21 | DRG: 481 | Disposition: A | Discharge status: Skilled Nursing Facility | Payer: Medicare Other | Attending: Internal Medicine | Admitting: Internal Medicine

## 2017-11-18 ENCOUNTER — Emergency Department (HOSPITAL_COMMUNITY): Payer: Medicare Other

## 2017-11-18 ENCOUNTER — Other Ambulatory Visit: Payer: Self-pay

## 2017-11-18 ENCOUNTER — Encounter (HOSPITAL_COMMUNITY): Payer: Self-pay | Admitting: Emergency Medicine

## 2017-11-18 DIAGNOSIS — F419 Anxiety disorder, unspecified: Secondary | ICD-10-CM | POA: Diagnosis present

## 2017-11-18 DIAGNOSIS — Z91018 Allergy to other foods: Secondary | ICD-10-CM | POA: Diagnosis not present

## 2017-11-18 DIAGNOSIS — I5032 Chronic diastolic (congestive) heart failure: Secondary | ICD-10-CM | POA: Diagnosis present

## 2017-11-18 DIAGNOSIS — E785 Hyperlipidemia, unspecified: Secondary | ICD-10-CM | POA: Diagnosis present

## 2017-11-18 DIAGNOSIS — I110 Hypertensive heart disease with heart failure: Secondary | ICD-10-CM | POA: Diagnosis present

## 2017-11-18 DIAGNOSIS — F0390 Unspecified dementia without behavioral disturbance: Secondary | ICD-10-CM | POA: Diagnosis present

## 2017-11-18 DIAGNOSIS — K219 Gastro-esophageal reflux disease without esophagitis: Secondary | ICD-10-CM | POA: Diagnosis present

## 2017-11-18 DIAGNOSIS — Z66 Do not resuscitate: Secondary | ICD-10-CM | POA: Diagnosis not present

## 2017-11-18 DIAGNOSIS — S72001A Fracture of unspecified part of neck of right femur, initial encounter for closed fracture: Secondary | ICD-10-CM | POA: Diagnosis present

## 2017-11-18 DIAGNOSIS — J449 Chronic obstructive pulmonary disease, unspecified: Secondary | ICD-10-CM | POA: Diagnosis present

## 2017-11-18 DIAGNOSIS — T148XXA Other injury of unspecified body region, initial encounter: Secondary | ICD-10-CM

## 2017-11-18 DIAGNOSIS — M25551 Pain in right hip: Secondary | ICD-10-CM | POA: Diagnosis present

## 2017-11-18 DIAGNOSIS — S72001G Fracture of unspecified part of neck of right femur, subsequent encounter for closed fracture with delayed healing: Secondary | ICD-10-CM | POA: Diagnosis not present

## 2017-11-18 DIAGNOSIS — Y92129 Unspecified place in nursing home as the place of occurrence of the external cause: Secondary | ICD-10-CM

## 2017-11-18 DIAGNOSIS — Z681 Body mass index (BMI) 19 or less, adult: Secondary | ICD-10-CM

## 2017-11-18 DIAGNOSIS — Z87891 Personal history of nicotine dependence: Secondary | ICD-10-CM

## 2017-11-18 DIAGNOSIS — S72011A Unspecified intracapsular fracture of right femur, initial encounter for closed fracture: Secondary | ICD-10-CM | POA: Diagnosis present

## 2017-11-18 DIAGNOSIS — L8990 Pressure ulcer of unspecified site, unspecified stage: Secondary | ICD-10-CM

## 2017-11-18 DIAGNOSIS — W19XXXA Unspecified fall, initial encounter: Secondary | ICD-10-CM | POA: Diagnosis present

## 2017-11-18 DIAGNOSIS — L89211 Pressure ulcer of right hip, stage 1: Secondary | ICD-10-CM | POA: Diagnosis present

## 2017-11-18 DIAGNOSIS — F0150 Vascular dementia without behavioral disturbance: Secondary | ICD-10-CM | POA: Diagnosis not present

## 2017-11-18 DIAGNOSIS — E440 Moderate protein-calorie malnutrition: Secondary | ICD-10-CM | POA: Diagnosis present

## 2017-11-18 HISTORY — DX: Hyperlipidemia, unspecified: E78.5

## 2017-11-18 LAB — HEPATIC FUNCTION PANEL
ALBUMIN: 3.7 g/dL (ref 3.5–5.0)
ALT: 29 U/L (ref 17–63)
AST: 24 U/L (ref 15–41)
Alkaline Phosphatase: 104 U/L (ref 38–126)
BILIRUBIN TOTAL: 1.2 mg/dL (ref 0.3–1.2)
Bilirubin, Direct: 0.2 mg/dL (ref 0.1–0.5)
Indirect Bilirubin: 1 mg/dL — ABNORMAL HIGH (ref 0.3–0.9)
Total Protein: 7.5 g/dL (ref 6.5–8.1)

## 2017-11-18 LAB — BASIC METABOLIC PANEL
Anion gap: 10 (ref 5–15)
BUN: 16 mg/dL (ref 6–20)
CALCIUM: 9.1 mg/dL (ref 8.9–10.3)
CHLORIDE: 100 mmol/L — AB (ref 101–111)
CO2: 25 mmol/L (ref 22–32)
CREATININE: 0.65 mg/dL (ref 0.61–1.24)
Glucose, Bld: 121 mg/dL — ABNORMAL HIGH (ref 65–99)
Potassium: 4.2 mmol/L (ref 3.5–5.1)
SODIUM: 135 mmol/L (ref 135–145)

## 2017-11-18 LAB — CBC WITH DIFFERENTIAL/PLATELET
BASOS ABS: 0 10*3/uL (ref 0.0–0.1)
BASOS PCT: 0 %
EOS ABS: 0 10*3/uL (ref 0.0–0.7)
EOS PCT: 0 %
HCT: 39.2 % (ref 39.0–52.0)
HEMOGLOBIN: 12.8 g/dL — AB (ref 13.0–17.0)
Lymphocytes Relative: 6 %
Lymphs Abs: 1.1 10*3/uL (ref 0.7–4.0)
MCH: 30.9 pg (ref 26.0–34.0)
MCHC: 32.7 g/dL (ref 30.0–36.0)
MCV: 94.7 fL (ref 78.0–100.0)
Monocytes Absolute: 1.4 10*3/uL — ABNORMAL HIGH (ref 0.1–1.0)
Monocytes Relative: 7 %
NEUTROS PCT: 87 %
Neutro Abs: 17.4 10*3/uL — ABNORMAL HIGH (ref 1.7–7.7)
PLATELETS: 301 10*3/uL (ref 150–400)
RBC: 4.14 MIL/uL — AB (ref 4.22–5.81)
RDW: 14.4 % (ref 11.5–15.5)
WBC: 20 10*3/uL — AB (ref 4.0–10.5)

## 2017-11-18 LAB — MAGNESIUM: MAGNESIUM: 1.8 mg/dL (ref 1.7–2.4)

## 2017-11-18 LAB — PROTIME-INR
INR: 1
PROTHROMBIN TIME: 13.1 s (ref 11.4–15.2)

## 2017-11-18 LAB — PHOSPHORUS: PHOSPHORUS: 3 mg/dL (ref 2.5–4.6)

## 2017-11-18 MED ORDER — MORPHINE SULFATE (PF) 2 MG/ML IV SOLN
2.0000 mg | INTRAVENOUS | Status: DC | PRN
  Administered 2017-11-18 – 2017-11-19 (×2): 2 mg via INTRAVENOUS
  Filled 2017-11-18 (×2): qty 1

## 2017-11-18 NOTE — H&P (Signed)
History and Physical    Chad Everett ZOX:[id number] DOB: Mar 13, 1931 DOA: 11/18/2017  PCP: [facility]   Patient coming from: [facility].  I have personally briefly reviewed patient's old medical records in [facility]  Chief Complaint: Fall.  HPI: Chad Everett is a 84 y.o. male with medical history significant of anxiety, asthma, dementia, GERD, hyperlipidemia, hypertension, history of pneumonia who is brought from [facility] after having a fall.  No further history is available at this time on the patient.  ED Course: Initial vital signs temperature 36.9C (98.5 F), pulse 76, respirations 18, blood pressure 135/58 mmHg and O2 sat 99% on room air.  His white count was 20.0 with 87% neutrophils and 6% lymphocytes, hemoglobin 12.8 g/dL and platelets 811.  PT and INR were normal.  Sodium 135, potassium 4.2, chloride 100 CO2 25 millimol/L.  Magnesium 1.8, phosphorus 3.0, calcium 9.1, glucose 121, BUN 16 and creatinine 0.65 mg/dL..  Imaging showed a chest radiograph with no active disease, but showing aortic atherosclerosis.  Right hip x-ray show questionable fracture of right femoral neck, which was confirmed with CT of the right hip.  Head CT shows atrophy and chronic microvascular ischemic change, but no acute intracranial abnormalities.  CT cervical spine did not show any fractures or acute findings.  Please see images and full radiology reports for further detail.  Review of Systems: Unable to fully obtain.   Past Medical History:  Diagnosis Date  . Anxiety   . Asthma   . Dementia   . GERD (gastroesophageal reflux disease)   . Hyperlipidemia   . Hypertension   . Pneumonia     History reviewed. No pertinent surgical history.   reports that he quit smoking about 34 years ago. His smoking use included cigarettes. He has a 10.00 pack-year smoking history. He has never used smokeless tobacco. He reports that he does not drink alcohol or use  drugs.  Allergies  Allergen Reactions  . Onion Other (See Comments)    Indigestion    Family History  Problem Relation Age of Onset  . Stroke Sister     Prior to Admission medications   Medication Sig Start Date End Date Taking? Authorizing Provider  acetaminophen (TYLENOL) 325 MG tablet Take 650 mg by mouth every 4 (four) hours as needed for moderate pain.   Yes [provider]  alum & mag hydroxide-simeth (MINTOX) 200-200-20 MG/5ML suspension Take 30 mLs by mouth daily as needed for indigestion or heartburn.   Yes [provider]  aspirin EC 81 MG tablet Take 81 mg by mouth every morning.    Yes [provider]  atorvastatin (LIPITOR) 40 MG tablet Take 40 mg by mouth every morning.   Yes [provider]  Coenzyme Q10 (CO Q-10) 200 MG CAPS Take 1 capsule by mouth daily.   Yes [provider]  guaifenesin (ROBITUSSIN) 100 MG/5ML syrup Take 200 mg by mouth every 6 (six) hours as needed for cough.   Yes [provider]  loperamide (IMODIUM) 2 MG capsule Take 2 mg by mouth as needed for diarrhea or loose stools.   Yes [provider]  Neomycin-Bacitracin-Polymyxin (TRIPLE ANTIBIOTIC) [phone] OINT Apply 1 application topically as needed (for minor skin tears or abrasions).   Yes [provider]  omeprazole (PRILOSEC) 20 MG capsule Take 40 mg by mouth daily.    Yes [provider]  psyllium (REGULOID) 0.52 g capsule Take 0.52 g by mouth daily.  Yes [provider]    Physical Exam: Vitals:   11/18/17 1900 11/18/17 2045 11/18/17 2046 11/18/17 2100  BP: 140/86 (!) 123/55 (!) 123/55 139/73  Pulse:   71   Resp:   18   Temp:      TempSrc:      SpO2:   95%     Constitutional: Sedated due to morphine, in no acute distress. Eyes: PERRL, lids and conjunctivae normal ENMT: Mucous membranes are mildly dry. Posterior pharynx clear of any exudate or lesions. Neck: normal, supple, no masses, no  thyromegaly Respiratory: Decreased breath sounds on bases, but no wheezing, no crackles. Normal respiratory effort. No accessory muscle use.  Cardiovascular: Regular rate and rhythm, +2/6 6 systolic ejection murmur, no rubs / gallops. No extremity edema. 2+ pedal pulses. No carotid bruits.  Abdomen: Soft, no tenderness, no masses palpated. No hepatosplenomegaly. Bowel sounds positive.  Musculoskeletal: no clubbing / cyanosis.  Good ROM, no contractures. Normal muscle tone.  Skin: Right cheek healing abrasion with mild surrounding erythema.  Left elbow area showing 2 small lacerations and right arm anterior lower third shows an ecchymosis area.  Positive stage I pressure ulcer on right hip area.  Please see pictures below for further detail. Neurologic: Does not follow commands or answer questions.  Seems to be grossly nonfocal, but unable to fully evaluate. Psychiatric:  Alert and oriented x 1, disoriented to place, time, date and situation.   Right cheek area.  Right elbow area.   Right hip area.  Labs on Admission: I have personally reviewed following labs and imaging studies  CBC: Recent Labs  Lab 11/18/17 2237  WBC 20.0*  NEUTROABS 17.4*  HGB 12.8*  HCT 39.2  MCV 94.7  PLT 301   Basic Metabolic Panel: Recent Labs  Lab 11/18/17 2237  NA 135  K 4.2  CL 100*  CO2 25  GLUCOSE 121*  BUN 16  CREATININE 0.65  CALCIUM 9.1  MG 1.8  PHOS 3.0   GFR: CrCl cannot be calculated (Unknown ideal weight.). Liver Function Tests: Recent Labs  Lab 11/18/17 2237  AST 24  ALT 29  ALKPHOS 104  BILITOT 1.2  PROT 7.5  ALBUMIN 3.7   No results for input(s): LIPASE, AMYLASE in the last 168 hours. No results for input(s): AMMONIA in the last 168 hours. Coagulation Profile: Recent Labs  Lab 11/18/17 2237  INR 1.00   Cardiac Enzymes: No results for input(s): CKTOTAL, CKMB, CKMBINDEX, TROPONINI in the last 168 hours. BNP (last 3 results) No results for input(s): PROBNP in the  last 8760 hours. HbA1C: No results for input(s): HGBA1C in the last 72 hours. CBG: No results for input(s): GLUCAP in the last 168 hours. Lipid Profile: No results for input(s): CHOL, HDL, LDLCALC, TRIG, CHOLHDL, LDLDIRECT in the last 72 hours. Thyroid Function Tests: No results for input(s): TSH, T4TOTAL, FREET4, T3FREE, THYROIDAB in the last 72 hours. Anemia Panel: No results for input(s): VITAMINB12, FOLATE, FERRITIN, TIBC, IRON, RETICCTPCT in the last 72 hours. Urine analysis:    Component Value Date/Time   COLORURINE YELLOW 10/22/2014 0932   APPEARANCEUR CLEAR 10/22/2014 0932   APPEARANCEUR Hazy 02/13/2012 1956   LABSPEC 1.015 10/22/2014 0932   LABSPEC 1.016 02/13/2012 1956   PHURINE 7.0 10/22/2014 0932   GLUCOSEU NEGATIVE 10/22/2014 0932   GLUCOSEU 150 mg/dL 16/05/9603 5409   HGBUR TRACE (A) 10/22/2014 0932   BILIRUBINUR NEGATIVE 10/22/2014 0932   BILIRUBINUR Negative 02/13/2012 1956   KETONESUR NEGATIVE 10/22/2014 0932  PROTEINUR 30 (A) 10/22/2014 0932   UROBILINOGEN 0.2 10/22/2014 0932   NITRITE NEGATIVE 10/22/2014 0932   LEUKOCYTESUR NEGATIVE 10/22/2014 0932   LEUKOCYTESUR Negative 02/13/2012 1956    Radiological Exams on Admission: Dg Knee 2 Views Right  Result Date: 11/18/2017 CLINICAL DATA:  Knee pain after fall EXAM: RIGHT KNEE - 1-2 VIEW COMPARISON:  None. FINDINGS: No fracture or malalignment. Mild patellofemoral degenerative change. Moderate degenerative change of the lateral compartment with narrowing and spurring. No large effusion IMPRESSION: Mild to moderate arthritis.  No acute osseous abnormality. Electronically Signed   By: Jasmine Pang M.D.   On: 11/18/2017 20:19   Ct Head Wo Contrast  Result Date: 11/18/2017 CLINICAL DATA:  Larey Seat at nursing home today.  Dementia. EXAM: CT HEAD WITHOUT CONTRAST CT CERVICAL SPINE WITHOUT CONTRAST TECHNIQUE: Multidetector CT imaging of the head and cervical spine was performed following the standard protocol without  intravenous contrast. Multiplanar CT image reconstructions of the cervical spine were also generated. COMPARISON:  10/21/2014 FINDINGS: CT HEAD FINDINGS Brain: No evidence of acute infarction, hemorrhage, hydrocephalus, extra-axial collection or mass lesion/mass effect. There is ventricular and sulcal enlargement reflecting moderate to advanced atrophy. Periventricular white matter hypoattenuation is also noted consistent with mild chronic microvascular ischemic change. Vascular: No hyperdense vessel or unexpected calcification. Skull: Normal. Negative for fracture or focal lesion. Sinuses/Orbits: Globes and orbits are unremarkable. Visualized sinuses and mastoid air cells are clear. Other: None. CT CERVICAL SPINE FINDINGS Alignment: Normal. Skull base and vertebrae: No acute fracture. No primary bone lesion or focal pathologic process. There is some deformity of the C2 vertebra that suggests an old, healed fracture. Soft tissues and spinal canal: No prevertebral fluid or swelling. No visible canal hematoma. Disc levels: Mild disc and facet degenerative changes. Disc degenerative change greatest at C3-C4. No convincing disc herniation. Upper chest: No acute findings. No masses or adenopathy. Pleuroparenchymal scarring at the lung apices. Other: None. IMPRESSION: HEAD CT 1. No acute intracranial abnormalities. 2. Atrophy and chronic microvascular ischemic change. 3. No skull fracture. CERVICAL CT 1. No fracture or acute finding Electronically Signed   By: Amie Portland M.D.   On: 11/18/2017 20:04   Ct Cervical Spine Wo Contrast  Result Date: 11/18/2017 CLINICAL DATA:  Larey Seat at nursing home today.  Dementia. EXAM: CT HEAD WITHOUT CONTRAST CT CERVICAL SPINE WITHOUT CONTRAST TECHNIQUE: Multidetector CT imaging of the head and cervical spine was performed following the standard protocol without intravenous contrast. Multiplanar CT image reconstructions of the cervical spine were also generated. COMPARISON:  10/21/2014  FINDINGS: CT HEAD FINDINGS Brain: No evidence of acute infarction, hemorrhage, hydrocephalus, extra-axial collection or mass lesion/mass effect. There is ventricular and sulcal enlargement reflecting moderate to advanced atrophy. Periventricular white matter hypoattenuation is also noted consistent with mild chronic microvascular ischemic change. Vascular: No hyperdense vessel or unexpected calcification. Skull: Normal. Negative for fracture or focal lesion. Sinuses/Orbits: Globes and orbits are unremarkable. Visualized sinuses and mastoid air cells are clear. Other: None. CT CERVICAL SPINE FINDINGS Alignment: Normal. Skull base and vertebrae: No acute fracture. No primary bone lesion or focal pathologic process. There is some deformity of the C2 vertebra that suggests an old, healed fracture. Soft tissues and spinal canal: No prevertebral fluid or swelling. No visible canal hematoma. Disc levels: Mild disc and facet degenerative changes. Disc degenerative change greatest at C3-C4. No convincing disc herniation. Upper chest: No acute findings. No masses or adenopathy. Pleuroparenchymal scarring at the lung apices. Other: None. IMPRESSION: HEAD CT 1. No acute  intracranial abnormalities. 2. Atrophy and chronic microvascular ischemic change. 3. No skull fracture. CERVICAL CT 1. No fracture or acute finding Electronically Signed   By: Amie Portlandavid  Ormond M.D.   On: 11/18/2017 20:04   Ct Hip Right Wo Contrast  Result Date: 11/18/2017 CLINICAL DATA:  Patient fell today. Unable to straighten legs. Right hip fracture. EXAM: CT OF THE RIGHT HIP WITHOUT CONTRAST TECHNIQUE: Multidetector CT imaging of the right hip was performed according to the standard protocol. Multiplanar CT image reconstructions were also generated. COMPARISON:  Same day radiographs of the right hip. FINDINGS: Bones/Joint/Cartilage Acute subcapital right femoral neck fracture with impaction of the neck on the head along its superolateral aspect is  identified, series 6/36, series 2/42. Associated small joint effusion. No joint dislocation. No acetabular or pubic rami fracture. The included sacrum and coccyx are intact. No pelvic diastasis. Ligaments Suboptimally assessed by CT. Muscles and Tendons No intramuscular hemorrhage or atrophy. Soft tissues Soft tissue edema compatible with soft tissue contusion overlying the right hip. No focal soft tissue hematoma. Moderate fecal retention within the included colon and rectum. IMPRESSION: 1. Acute, closed, subcapital right femoral neck fracture with impaction of the femoral neck on the femoral head along its superolateral aspect. Small joint effusion. No joint dislocation. 2. Increased stool burden within the included colon and rectum. Electronically Signed   By: Tollie Ethavid  Kwon M.D.   On: 11/18/2017 21:39   Dg Chest Port 1 View  Result Date: 11/18/2017 CLINICAL DATA:  Hip fracture EXAM: PORTABLE CHEST 1 VIEW COMPARISON:  06/24/2016 FINDINGS: Low lung volumes. No acute consolidation or effusion. Cardiomediastinal silhouette within normal limits. Aortic atherosclerosis. No pneumothorax. IMPRESSION: No active disease. Electronically Signed   By: Jasmine PangKim  Fujinaga M.D.   On: 11/18/2017 22:35   Dg Hip Unilat W Or Wo Pelvis 2-3 Views Right  Result Date: 11/18/2017 CLINICAL DATA:  Right leg pain after fall EXAM: DG HIP (WITH OR WITHOUT PELVIS) 2-3V RIGHT COMPARISON:  None. FINDINGS: Possible step-off deformity at the right femoral head neck junction of the right hip. No dislocation. Pubic symphysis and rami appear intact. Both femoral heads project in joint. IMPRESSION: Possible subtle subcapital fracture involving the proximal right femur, CT recommended to further evaluate. Electronically Signed   By: Jasmine PangKim  Fujinaga M.D.   On: 11/18/2017 20:18   05/18/2013 echocardiogram with contrast  ------------------------------------------------------------ LV EF: 60% -   65%  ------------------------------------------------------------ Indications:   Syncope 780.2.  ------------------------------------------------------------ History:  PMH: No prior cardiac history. Risk factors: Dementia, GERD  ------------------------------------------------------------ Study Conclusions  - Study data: Technically adequate study - Left ventricle: The cavity size was normal. Wall thickness was increased in a pattern of mild LVH. Systolic function was normal. The estimated ejection fraction was in the range of 60% to 65%. Wall motion was normal; there were no regional wall motion abnormalities. Doppler parameters are consistent with abnormal left ventricular relaxation (grade 1 diastolic dysfunction). - Aortic valve: Valve area: 3.15cm^2(VTI). Valve area: 2.36cm^2 (Vmax). - Systemic veins: IVC is small, consistent with low right atrial pressures and hypovolemia.   EKG: Independently reviewed. Vent. rate 70 BPM PR interval * ms QRS duration 88 ms QT/QTc 399/431 ms P-R-T axes 23 70 52 Sinus rhythm Minimal ST depression, anterolateral leads Artifact in lead(s) I III aVL V2 V3 V6  Assessment/Plan Principal Problem:   Closed right hip fracture, initial encounter (HCC) Admit to telemetry/inpatient. [place]ucks traction per protocol. Continue analgesics as needed. Frequent neurovascular checks. Orthopedic surgery to evaluate in a.m.  Active  Problems:   Dementia Labeled as vascular type. Continue supportive care. Continue atorvastatin and aspirin.    GERD (gastroesophageal reflux disease) Protonix 40 mg p.o. daily.    Anxiety No signs of being symptomatic at this time. Anxiolytics as needed.    COPD (chronic obstructive pulmonary disease) (HCC) Supplemental oxygen and bronchodilators as needed.    Hyperlipidemia Continue atorvastatin 40 mg p.o. daily. Monitor LFTs.    DVT prophylaxis: SCDs. Code Status: DNR. Family  Communication: No family at bedside. Disposition Plan: Admit due to right hip fracture for possible ORIF surgery. Consults called: Orthopedic surgery will see in the morning. Admission status: Inpatient/telemetry.   Bobette Mo MD [facility] Pager [phone].  If 7PM-7AM, please contact night-coverage [email or web address] Password [facility]  11/18/2017, 11:39 PM

## 2017-11-18 NOTE — ED Triage Notes (Signed)
Pt from [facility].   EMS called for a fall.  Pt c/o of right hip and leg pain.

## 2017-11-18 NOTE — ED Notes (Signed)
Patient transported to CT 

## 2017-11-18 NOTE — ED Provider Notes (Signed)
Emergency Department Provider Note   I have reviewed the triage vital signs and the nursing notes.   HISTORY  Chief Complaint Fall   HPI Chad Everett is a 84 y.o. male with GERD, HTN, asthma, and dementia since to the emergency department by EMS after an unwitnessed fall at his skilled nursing facility.  The patient is a resident at [facility].  Patient is unable to provide significant historical detail or recount what happened to him during the fall because of dementia.  I called to speak with the patient's nurse at [facility] house, [employer], who describes an unwitnessed fall.  There were nearby aids to came to his assistance.  He was able to get back into a chair but seems somewhat groggy.  The gave him a cool washcloth to the forehead and he seemed to improve back to his mental status baseline.  They are unsure if he struck his head.  Initially, the patient was complaining of pain everywhere but later began complaining of pain in the right hip/leg primarily.  EMS was called for transport at that time.  Level 5 caveat: Dementia.    Past Medical History:  Diagnosis Date  . Anxiety   . Asthma   . Dementia   . GERD (gastroesophageal reflux disease)   . Hyperlipidemia   . Hypertension   . Pneumonia     Patient Active Problem List   Diagnosis Date Noted  . Closed right hip fracture, initial encounter (HCC) 11/18/2017  . Hyperlipidemia 11/18/2017  . Encephalopathy 10/22/2014  . Acute dyspnea 10/21/2014  . HCAP (healthcare-associated pneumonia) 10/21/2014  . Diastolic heart failure (HCC) 10/21/2014  . Aspiration pneumonia (HCC) 08/13/2014  . COPD (chronic obstructive pulmonary disease) (HCC) 08/13/2014  . Acute respiratory failure with hypoxia (HCC) 08/13/2014  . Hypokalemia 08/13/2014  . Chronic diastolic congestive heart failure (HCC) 08/13/2014  . Weakness 08/11/2014  . Near syncope 05/17/2013  . Syncope 05/17/2013  . Dementia   . GERD (gastroesophageal reflux disease)    . Anxiety     History reviewed. No pertinent surgical history.    Allergies Onion  Family History  Problem Relation Age of Onset  . Stroke Sister     Social History Social History   Tobacco Use  . Smoking status: Former Smoker    Packs/day: 0.50    Years: 20.00    Pack years: 10.00    Types: Cigarettes    Last attempt to quit: 08/12/1983    Years since quitting: 34.2  . Smokeless tobacco: Never Used  Substance Use Topics  . Alcohol use: No  . Drug use: No    Review of Systems  Level 5 caveat: Dementia.   ____________________________________________   PHYSICAL EXAM:  VITAL SIGNS: ED Triage Vitals [11/18/17 1816]  Enc Vitals Group     BP (!) 135/58     Pulse Rate 76     Resp 18     Temp 98.5 F (36.9 C)     Temp Source Oral     SpO2 99 %   Constitutional: Alert but confused. Well appearing and in no acute distress. Eyes: Conjunctivae are normal. PERRL. Head: Atraumatic. Nose: No congestion/rhinnorhea. Mouth/Throat: Mucous membranes are moist. Neck: No stridor. No cervical spine tenderness to palpation. Cardiovascular: Normal rate, regular rhythm. Good peripheral circulation. Grossly normal heart sounds.   Respiratory: Normal respiratory effort.  No retractions. Lungs CTAB. Gastrointestinal: Soft and nontender. No distention.  Musculoskeletal: No lower extremity edema. Mild pain with passive ROM of the  right hip/knee without deformity or shortening. Normal DP/PT pulses.  Neurologic: No gross focal neurologic deficits are appreciated.  Skin:  Skin is warm, dry and intact. No rash noted.  ____________________________________________  RADIOLOGY  Dg Knee 2 Views Right  Result Date: 11/18/2017 CLINICAL DATA:  Knee pain after fall EXAM: RIGHT KNEE - 1-2 VIEW COMPARISON:  None. FINDINGS: No fracture or malalignment. Mild patellofemoral degenerative change. Moderate degenerative change of the lateral compartment with narrowing and spurring. No large  effusion IMPRESSION: Mild to moderate arthritis.  No acute osseous abnormality. Electronically Signed   By: Jasmine PangKim  Fujinaga M.D.   On: 11/18/2017 20:19   Ct Head Wo Contrast  Result Date: 11/18/2017 CLINICAL DATA:  Larey SeatFell at nursing home today.  Dementia. EXAM: CT HEAD WITHOUT CONTRAST CT CERVICAL SPINE WITHOUT CONTRAST TECHNIQUE: Multidetector CT imaging of the head and cervical spine was performed following the standard protocol without intravenous contrast. Multiplanar CT image reconstructions of the cervical spine were also generated. COMPARISON:  10/21/2014 FINDINGS: CT HEAD FINDINGS Brain: No evidence of acute infarction, hemorrhage, hydrocephalus, extra-axial collection or mass lesion/mass effect. There is ventricular and sulcal enlargement reflecting moderate to advanced atrophy. Periventricular white matter hypoattenuation is also noted consistent with mild chronic microvascular ischemic change. Vascular: No hyperdense vessel or unexpected calcification. Skull: Normal. Negative for fracture or focal lesion. Sinuses/Orbits: Globes and orbits are unremarkable. Visualized sinuses and mastoid air cells are clear. Other: None. CT CERVICAL SPINE FINDINGS Alignment: Normal. Skull base and vertebrae: No acute fracture. No primary bone lesion or focal pathologic process. There is some deformity of the C2 vertebra that suggests an old, healed fracture. Soft tissues and spinal canal: No prevertebral fluid or swelling. No visible canal hematoma. Disc levels: Mild disc and facet degenerative changes. Disc degenerative change greatest at C3-C4. No convincing disc herniation. Upper chest: No acute findings. No masses or adenopathy. Pleuroparenchymal scarring at the lung apices. Other: None. IMPRESSION: HEAD CT 1. No acute intracranial abnormalities. 2. Atrophy and chronic microvascular ischemic change. 3. No skull fracture. CERVICAL CT 1. No fracture or acute finding Electronically Signed   By: Amie Portlandavid  Ormond M.D.   On:  11/18/2017 20:04   Ct Cervical Spine Wo Contrast  Result Date: 11/18/2017 CLINICAL DATA:  Larey SeatFell at nursing home today.  Dementia. EXAM: CT HEAD WITHOUT CONTRAST CT CERVICAL SPINE WITHOUT CONTRAST TECHNIQUE: Multidetector CT imaging of the head and cervical spine was performed following the standard protocol without intravenous contrast. Multiplanar CT image reconstructions of the cervical spine were also generated. COMPARISON:  10/21/2014 FINDINGS: CT HEAD FINDINGS Brain: No evidence of acute infarction, hemorrhage, hydrocephalus, extra-axial collection or mass lesion/mass effect. There is ventricular and sulcal enlargement reflecting moderate to advanced atrophy. Periventricular white matter hypoattenuation is also noted consistent with mild chronic microvascular ischemic change. Vascular: No hyperdense vessel or unexpected calcification. Skull: Normal. Negative for fracture or focal lesion. Sinuses/Orbits: Globes and orbits are unremarkable. Visualized sinuses and mastoid air cells are clear. Other: None. CT CERVICAL SPINE FINDINGS Alignment: Normal. Skull base and vertebrae: No acute fracture. No primary bone lesion or focal pathologic process. There is some deformity of the C2 vertebra that suggests an old, healed fracture. Soft tissues and spinal canal: No prevertebral fluid or swelling. No visible canal hematoma. Disc levels: Mild disc and facet degenerative changes. Disc degenerative change greatest at C3-C4. No convincing disc herniation. Upper chest: No acute findings. No masses or adenopathy. Pleuroparenchymal scarring at the lung apices. Other: None. IMPRESSION: HEAD CT 1. No acute intracranial  abnormalities. 2. Atrophy and chronic microvascular ischemic change. 3. No skull fracture. CERVICAL CT 1. No fracture or acute finding Electronically Signed   By: Amie Portland M.D.   On: 11/18/2017 20:04   Ct Hip Right Wo Contrast  Result Date: 11/18/2017 CLINICAL DATA:  Patient fell today. Unable to  straighten legs. Right hip fracture. EXAM: CT OF THE RIGHT HIP WITHOUT CONTRAST TECHNIQUE: Multidetector CT imaging of the right hip was performed according to the standard protocol. Multiplanar CT image reconstructions were also generated. COMPARISON:  Same day radiographs of the right hip. FINDINGS: Bones/Joint/Cartilage Acute subcapital right femoral neck fracture with impaction of the neck on the head along its superolateral aspect is identified, series 6/36, series 2/42. Associated small joint effusion. No joint dislocation. No acetabular or pubic rami fracture. The included sacrum and coccyx are intact. No pelvic diastasis. Ligaments Suboptimally assessed by CT. Muscles and Tendons No intramuscular hemorrhage or atrophy. Soft tissues Soft tissue edema compatible with soft tissue contusion overlying the right hip. No focal soft tissue hematoma. Moderate fecal retention within the included colon and rectum. IMPRESSION: 1. Acute, closed, subcapital right femoral neck fracture with impaction of the femoral neck on the femoral head along its superolateral aspect. Small joint effusion. No joint dislocation. 2. Increased stool burden within the included colon and rectum. Electronically Signed   By: Tollie Eth M.D.   On: 11/18/2017 21:39   Dg Chest Port 1 View  Result Date: 11/18/2017 CLINICAL DATA:  Hip fracture EXAM: PORTABLE CHEST 1 VIEW COMPARISON:  06/24/2016 FINDINGS: Low lung volumes. No acute consolidation or effusion. Cardiomediastinal silhouette within normal limits. Aortic atherosclerosis. No pneumothorax. IMPRESSION: No active disease. Electronically Signed   By: Jasmine Pang M.D.   On: 11/18/2017 22:35   Dg Hip Unilat W Or Wo Pelvis 2-3 Views Right  Result Date: 11/18/2017 CLINICAL DATA:  Right leg pain after fall EXAM: DG HIP (WITH OR WITHOUT PELVIS) 2-3V RIGHT COMPARISON:  None. FINDINGS: Possible step-off deformity at the right femoral head neck junction of the right hip. No dislocation.  Pubic symphysis and rami appear intact. Both femoral heads project in joint. IMPRESSION: Possible subtle subcapital fracture involving the proximal right femur, CT recommended to further evaluate. Electronically Signed   By: Jasmine Pang M.D.   On: 11/18/2017 20:18    ____________________________________________   PROCEDURES  Procedure(s) performed:   Procedures  None ____________________________________________   INITIAL IMPRESSION / ASSESSMENT AND PLAN / ED COURSE  Pertinent labs & imaging results that were available during my care of the patient were reviewed by me and considered in my medical decision making (see chart for details).  Presents to the emergency department for evaluation after an unwitnessed fall at his nursing facility.  He has some mild pain with passive range of motion of the right hip.  Plan for plain imaging of this area along with the knee on the right side.  Normal range of motion of the left hip and knee.  No ankle pain or deformity.  No pain or swelling in the upper extremities.  No evidence of head trauma.  Cervical spine is nontender to palpation.  Given the patient's dementia and the fact that the fall was unwitnessed and he seems somewhat groggy after falling a plan to obtain a CT of the head and cervical spine.   Plain films and CT results reviewed. Question subtle right proximal femur fx. Advised CT which was ordered.   CT shows acute fracture. Discussed case with Dr.  Romeo Apple who advises admission to AP and he will consult in the AM. Attempted to reach DSS listed as patient emergency contact but unsuccessful.   Discussed patient's case with Hospitalist, Dr. Robb Matar to request admission. Patient and family (if present) updated with plan. Care transferred to Hospitalist service.  I reviewed all nursing notes, vitals, pertinent old records, EKGs, labs, imaging (as available).  ____________________________________________  FINAL CLINICAL IMPRESSION(S) /  ED DIAGNOSES  Final diagnoses:  Closed fracture of right hip, initial encounter (HCC)     MEDICATIONS GIVEN DURING THIS VISIT:  Medications  morphine 2 MG/ML injection 2 mg ( Intravenous MAR Hold 11/19/17 1021)  chlorhexidine (HIBICLENS) 4 % liquid 4 application (has no administration in time range)  povidone-iodine 10 % swab 2 application (has no administration in time range)  ceFAZolin (ANCEF) IVPB 2g/100 mL premix (has no administration in time range)  lactated ringers infusion (has no administration in time range)  0.9 %  sodium chloride infusion ( Intravenous New Bag/Given 11/19/17 0906)     Note:  This document was prepared using Dragon voice recognition software and may include unintentional dictation errors.  Alona Bene, MD Emergency Medicine   Mael Delap, Arlyss Repress, MD 11/19/17 1149

## 2017-11-19 ENCOUNTER — Inpatient Hospital Stay (HOSPITAL_COMMUNITY): Payer: Medicare Other | Admitting: Anesthesiology

## 2017-11-19 ENCOUNTER — Inpatient Hospital Stay (HOSPITAL_COMMUNITY): Payer: Medicare Other

## 2017-11-19 ENCOUNTER — Encounter (HOSPITAL_COMMUNITY): Payer: Self-pay | Admitting: *Deleted

## 2017-11-19 ENCOUNTER — Other Ambulatory Visit: Payer: Self-pay

## 2017-11-19 ENCOUNTER — Encounter (HOSPITAL_COMMUNITY)
Admission: EM | Disposition: A | Discharge status: Skilled Nursing Facility | Payer: Self-pay | Source: Home / Self Care | Attending: Internal Medicine

## 2017-11-19 DIAGNOSIS — E440 Moderate protein-calorie malnutrition: Secondary | ICD-10-CM

## 2017-11-19 DIAGNOSIS — E785 Hyperlipidemia, unspecified: Secondary | ICD-10-CM

## 2017-11-19 DIAGNOSIS — K219 Gastro-esophageal reflux disease without esophagitis: Secondary | ICD-10-CM

## 2017-11-19 DIAGNOSIS — F0150 Vascular dementia without behavioral disturbance: Secondary | ICD-10-CM

## 2017-11-19 HISTORY — PX: HIP PINNING,CANNULATED: SHX1758

## 2017-11-19 LAB — CBC WITH DIFFERENTIAL/PLATELET
BASOS ABS: 0 10*3/uL (ref 0.0–0.1)
BASOS PCT: 0 %
EOS PCT: 0 %
Eosinophils Absolute: 0 10*3/uL (ref 0.0–0.7)
HCT: 34.6 % — ABNORMAL LOW (ref 39.0–52.0)
Hemoglobin: 11.3 g/dL — ABNORMAL LOW (ref 13.0–17.0)
LYMPHS PCT: 7 %
Lymphs Abs: 1.1 10*3/uL (ref 0.7–4.0)
MCH: 30.8 pg (ref 26.0–34.0)
MCHC: 32.7 g/dL (ref 30.0–36.0)
MCV: 94.3 fL (ref 78.0–100.0)
Monocytes Absolute: 1.1 10*3/uL — ABNORMAL HIGH (ref 0.1–1.0)
Monocytes Relative: 7 %
Neutro Abs: 13.9 10*3/uL — ABNORMAL HIGH (ref 1.7–7.7)
Neutrophils Relative %: 86 %
PLATELETS: 284 10*3/uL (ref 150–400)
RBC: 3.67 MIL/uL — AB (ref 4.22–5.81)
RDW: 14.4 % (ref 11.5–15.5)
WBC: 16.1 10*3/uL — AB (ref 4.0–10.5)

## 2017-11-19 LAB — BASIC METABOLIC PANEL
ANION GAP: 11 (ref 5–15)
BUN: 18 mg/dL (ref 6–20)
CHLORIDE: 100 mmol/L — AB (ref 101–111)
CO2: 24 mmol/L (ref 22–32)
CREATININE: 0.72 mg/dL (ref 0.61–1.24)
Calcium: 8.7 mg/dL — ABNORMAL LOW (ref 8.9–10.3)
GFR calc non Af Amer: >60 mL/min (ref >60)
GLUCOSE: 129 mg/dL — AB (ref 65–99)
Potassium: 3.9 mmol/L (ref 3.5–5.1)
Sodium: 135 mmol/L (ref 135–145)

## 2017-11-19 LAB — PREPARE RBC (CROSSMATCH)

## 2017-11-19 LAB — ABO/RH: ABO/RH(D): A POS

## 2017-11-19 LAB — MRSA PCR SCREENING: MRSA BY PCR: POSITIVE — AB

## 2017-11-19 SURGERY — FIXATION, FEMUR, NECK, PERCUTANEOUS, USING SCREW
Anesthesia: General | Laterality: Right

## 2017-11-19 MED ORDER — FENTANYL CITRATE (PF) 250 MCG/5ML IJ SOLN
INTRAMUSCULAR | Status: AC
  Filled 2017-11-19: qty 5

## 2017-11-19 MED ORDER — CEFAZOLIN SODIUM-DEXTROSE 2-4 GM/100ML-% IV SOLN
2.0000 g | Freq: Four times a day (QID) | INTRAVENOUS | Status: AC
  Administered 2017-11-19 (×2): 2 g via INTRAVENOUS
  Filled 2017-11-19 (×2): qty 100

## 2017-11-19 MED ORDER — GUAIFENESIN 100 MG/5ML PO SYRP
200.0000 mg | ORAL_SOLUTION | Freq: Four times a day (QID) | ORAL | Status: DC | PRN
  Filled 2017-11-19: qty 10

## 2017-11-19 MED ORDER — CEFAZOLIN SODIUM-DEXTROSE 2-4 GM/100ML-% IV SOLN
INTRAVENOUS | Status: AC
  Filled 2017-11-19: qty 100

## 2017-11-19 MED ORDER — FENTANYL CITRATE (PF) 250 MCG/5ML IJ SOLN
INTRAMUSCULAR | Status: DC | PRN
  Administered 2017-11-19 (×3): 50 ug via INTRAVENOUS

## 2017-11-19 MED ORDER — ASPIRIN EC 325 MG PO TBEC
325.0000 mg | DELAYED_RELEASE_TABLET | Freq: Every day | ORAL | Status: DC
  Administered 2017-11-20 – 2017-11-21 (×2): 325 mg via ORAL
  Filled 2017-11-19 (×2): qty 1

## 2017-11-19 MED ORDER — SODIUM CHLORIDE 0.9 % IV SOLN
Freq: Once | INTRAVENOUS | Status: AC
  Administered 2017-11-19: 09:00:00 via INTRAVENOUS

## 2017-11-19 MED ORDER — PHENOL 1.4 % MT LIQD: 1.0000 | OROMUCOSAL | Status: DC | PRN

## 2017-11-19 MED ORDER — SODIUM CHLORIDE 0.9 % IV SOLN
INTRAVENOUS | Status: DC | PRN
  Administered 2017-11-19: 12:00:00 via INTRAVENOUS

## 2017-11-19 MED ORDER — ROCURONIUM BROMIDE 50 MG/5ML IV SOLN
INTRAVENOUS | Status: AC
  Filled 2017-11-19: qty 1

## 2017-11-19 MED ORDER — CHLORHEXIDINE GLUCONATE 4 % EX LIQD: 60.0000 mL | Freq: Once | CUTANEOUS | Status: DC

## 2017-11-19 MED ORDER — MENTHOL 3 MG MT LOZG: 1.0000 | LOZENGE | OROMUCOSAL | Status: DC | PRN

## 2017-11-19 MED ORDER — ONDANSETRON HCL 4 MG/2ML IJ SOLN
INTRAMUSCULAR | Status: AC
  Filled 2017-11-19: qty 2

## 2017-11-19 MED ORDER — ONDANSETRON HCL 4 MG PO TABS: 4.0000 mg | ORAL_TABLET | Freq: Four times a day (QID) | ORAL | Status: DC | PRN

## 2017-11-19 MED ORDER — CO Q-10 200 MG PO CAPS: 1.0000 | ORAL_CAPSULE | Freq: Every day | ORAL | Status: DC

## 2017-11-19 MED ORDER — DOCUSATE SODIUM 100 MG PO CAPS
100.0000 mg | ORAL_CAPSULE | Freq: Two times a day (BID) | ORAL | Status: DC
  Administered 2017-11-20 – 2017-11-21 (×3): 100 mg via ORAL
  Filled 2017-11-19 (×3): qty 1

## 2017-11-19 MED ORDER — ETOMIDATE 2 MG/ML IV SOLN
INTRAVENOUS | Status: AC
  Filled 2017-11-19: qty 10

## 2017-11-19 MED ORDER — PHENYLEPHRINE HCL 10 MG/ML IJ SOLN
INTRAMUSCULAR | Status: DC | PRN
  Administered 2017-11-19: 100 ug via INTRAVENOUS
  Administered 2017-11-19: 80 ug via INTRAVENOUS
  Administered 2017-11-19: 60 ug via INTRAVENOUS

## 2017-11-19 MED ORDER — ONDANSETRON HCL 4 MG/2ML IJ SOLN: 4.0000 mg | Freq: Four times a day (QID) | INTRAMUSCULAR | Status: DC | PRN

## 2017-11-19 MED ORDER — LACTATED RINGERS IV SOLN: INTRAVENOUS | Status: DC

## 2017-11-19 MED ORDER — BUPIVACAINE-EPINEPHRINE (PF) 0.5% -1:200000 IJ SOLN
INTRAMUSCULAR | Status: DC | PRN
  Administered 2017-11-19: 30 mL

## 2017-11-19 MED ORDER — LOPERAMIDE HCL 2 MG PO CAPS: 2.0000 mg | ORAL_CAPSULE | ORAL | Status: DC | PRN

## 2017-11-19 MED ORDER — TRIPLE ANTIBIOTIC 3.5-400-5000 EX OINT: 1.0000 "application " | TOPICAL_OINTMENT | CUTANEOUS | Status: DC | PRN

## 2017-11-19 MED ORDER — TRAMADOL HCL 50 MG PO TABS
50.0000 mg | ORAL_TABLET | Freq: Four times a day (QID) | ORAL | Status: DC | PRN
  Administered 2017-11-20 – 2017-11-21 (×2): 50 mg via ORAL
  Filled 2017-11-19 (×2): qty 1

## 2017-11-19 MED ORDER — POVIDONE-IODINE 10 % EX SWAB: 2.0000 "application " | Freq: Once | CUTANEOUS | Status: DC

## 2017-11-19 MED ORDER — PROMETHAZINE HCL 25 MG/ML IJ SOLN: 6.2500 mg | INTRAMUSCULAR | Status: DC | PRN

## 2017-11-19 MED ORDER — METOCLOPRAMIDE HCL 10 MG PO TABS
5.0000 mg | ORAL_TABLET | Freq: Three times a day (TID) | ORAL | Status: DC | PRN
Start: 2017-11-19 — End: 2017-11-21

## 2017-11-19 MED ORDER — HYDROCODONE-ACETAMINOPHEN 7.5-325 MG PO TABS: 1.0000 | ORAL_TABLET | Freq: Once | ORAL | Status: DC | PRN

## 2017-11-19 MED ORDER — ATORVASTATIN CALCIUM 40 MG PO TABS
40.0000 mg | ORAL_TABLET | Freq: Every morning | ORAL | Status: DC
  Administered 2017-11-20 – 2017-11-21 (×2): 40 mg via ORAL
  Filled 2017-11-19 (×2): qty 1

## 2017-11-19 MED ORDER — PANTOPRAZOLE SODIUM 40 MG PO TBEC
40.0000 mg | DELAYED_RELEASE_TABLET | Freq: Every day | ORAL | Status: DC
  Administered 2017-11-20 – 2017-11-21 (×2): 40 mg via ORAL
  Filled 2017-11-19 (×2): qty 1

## 2017-11-19 MED ORDER — PROPOFOL 10 MG/ML IV BOLUS
INTRAVENOUS | Status: DC | PRN
  Administered 2017-11-19: 100 mg via INTRAVENOUS

## 2017-11-19 MED ORDER — BUPIVACAINE-EPINEPHRINE (PF) 0.5% -1:200000 IJ SOLN
INTRAMUSCULAR | Status: AC
  Filled 2017-11-19: qty 60

## 2017-11-19 MED ORDER — ONDANSETRON HCL 4 MG/2ML IJ SOLN
INTRAMUSCULAR | Status: DC | PRN
  Administered 2017-11-19: 4 mg via INTRAVENOUS

## 2017-11-19 MED ORDER — PSYLLIUM 95 % PO PACK
1.0000 | PACK | Freq: Every day | ORAL | Status: DC
Start: 2017-11-20 — End: 2017-11-21
  Administered 2017-11-20 – 2017-11-21 (×2): 1 via ORAL
  Filled 2017-11-19 (×2): qty 1

## 2017-11-19 MED ORDER — HYDROMORPHONE HCL 1 MG/ML IJ SOLN: 0.2500 mg | INTRAMUSCULAR | Status: DC | PRN

## 2017-11-19 MED ORDER — ACETAMINOPHEN 500 MG PO TABS: 500.0000 mg | ORAL_TABLET | Freq: Four times a day (QID) | ORAL | Status: DC | PRN

## 2017-11-19 MED ORDER — SODIUM CHLORIDE 0.9 % IR SOLN
Status: DC | PRN
  Administered 2017-11-19: 1000 mL

## 2017-11-19 MED ORDER — CEFAZOLIN SODIUM-DEXTROSE 2-4 GM/100ML-% IV SOLN
2.0000 g | INTRAVENOUS | Status: AC
  Administered 2017-11-19: 2 g via INTRAVENOUS

## 2017-11-19 MED ORDER — LIDOCAINE HCL (PF) 1 % IJ SOLN
INTRAMUSCULAR | Status: AC
  Filled 2017-11-19: qty 5

## 2017-11-19 MED ORDER — MEPERIDINE HCL 50 MG/ML IJ SOLN: 6.2500 mg | INTRAMUSCULAR | Status: DC | PRN

## 2017-11-19 MED ORDER — METOCLOPRAMIDE HCL 5 MG/ML IJ SOLN: 5.0000 mg | Freq: Three times a day (TID) | INTRAMUSCULAR | Status: DC | PRN

## 2017-11-19 SURGICAL SUPPLY — 51 items
BAG HAMPER (MISCELLANEOUS) ×3 IMPLANT
BIT DRILL 5 ACE CANN QC (BIT) ×3 IMPLANT
BLADE HEX COATED 2.75 (ELECTRODE) ×3 IMPLANT
BNDG GAUZE ELAST 4 BULKY (GAUZE/BANDAGES/DRESSINGS) ×3 IMPLANT
CHLORAPREP W/TINT 26ML (MISCELLANEOUS) ×3 IMPLANT
CLOTH BEACON ORANGE TIMEOUT ST (SAFETY) ×3 IMPLANT
COVER LIGHT HANDLE STERIS (MISCELLANEOUS) ×8 IMPLANT
DECANTER SPIKE VIAL GLASS SM (MISCELLANEOUS) ×4 IMPLANT
DRAPE STERI IOBAN 125X83 (DRAPES) ×3 IMPLANT
DRESSING ALLEVYN LIFE SACRUM (GAUZE/BANDAGES/DRESSINGS) ×2 IMPLANT
GLOVE BIO SURGEON STRL SZ7 (GLOVE) ×4 IMPLANT
GLOVE BIOGEL PI IND STRL 7.0 (GLOVE) ×1 IMPLANT
GLOVE BIOGEL PI INDICATOR 7.0 (GLOVE) ×8
GLOVE ECLIPSE 6.5 STRL STRAW (GLOVE) ×2 IMPLANT
GLOVE SKINSENSE NS SZ8.0 LF (GLOVE) ×2
GLOVE SKINSENSE STRL SZ8.0 LF (GLOVE) ×1 IMPLANT
GLOVE SS N UNI LF 8.5 STRL (GLOVE) ×3 IMPLANT
GOWN STRL REUS W/TWL LRG LVL3 (GOWN DISPOSABLE) ×10 IMPLANT
GOWN STRL REUS W/TWL XL LVL3 (GOWN DISPOSABLE) ×3 IMPLANT
INST SET MAJOR BONE (KITS) ×3 IMPLANT
KIT BLADEGUARD II DBL (SET/KITS/TRAYS/PACK) ×3 IMPLANT
KIT TURNOVER KIT A (KITS) ×3 IMPLANT
MANIFOLD NEPTUNE II (INSTRUMENTS) ×3 IMPLANT
MARKER SKIN DUAL TIP RULER LAB (MISCELLANEOUS) ×3 IMPLANT
NDL HYPO 21X1.5 SAFETY (NEEDLE) ×1 IMPLANT
NDL SPNL 18GX3.5 QUINCKE PK (NEEDLE) ×1 IMPLANT
NEEDLE HYPO 21X1.5 SAFETY (NEEDLE) ×3 IMPLANT
NEEDLE SPNL 18GX3.5 QUINCKE PK (NEEDLE) ×3 IMPLANT
NS IRRIG 1000ML POUR BTL (IV SOLUTION) ×3 IMPLANT
PACK BASIC III (CUSTOM PROCEDURE TRAY) ×2
PACK SRG BSC III STRL LF ECLPS (CUSTOM PROCEDURE TRAY) ×1 IMPLANT
PAD ABD 5X9 TENDERSORB (GAUZE/BANDAGES/DRESSINGS) ×5 IMPLANT
PENCIL HANDSWITCHING (ELECTRODE) ×3 IMPLANT
PIN THREADED GUIDE ACE (PIN) ×9 IMPLANT
SCREW CANN 22X6.5X100 (Screw) IMPLANT
SCREW CANN 6.5 100MM (Screw) ×4 IMPLANT
SCREW CANN 6.5 90MM (Screw) ×2 IMPLANT
SCREW CANN 6.5 95MM (Screw) ×4 IMPLANT
SCREW CANN LG 6.5 FLT 90X22 (Screw) IMPLANT
SCREW CANN LG 6.5 FLT 95X22 (Screw) IMPLANT
SET BASIN LINEN APH (SET/KITS/TRAYS/PACK) ×3 IMPLANT
SPONGE LAP 18X18 X RAY DECT (DISPOSABLE) ×3 IMPLANT
STAPLER VISISTAT 35W (STAPLE) ×3 IMPLANT
SUT BRALON NAB BRD #1 30IN (SUTURE) ×2 IMPLANT
SUT MNCRL 0 VIOLET CTX 36 (SUTURE) ×1 IMPLANT
SUT MON AB 2-0 CT1 36 (SUTURE) ×3 IMPLANT
SUT MONOCRYL 0 CTX 36 (SUTURE) ×2
SYR 30ML LL (SYRINGE) ×3 IMPLANT
SYR BULB IRRIGATION 50ML (SYRINGE) ×6 IMPLANT
WASHER ACECAN 6.5 (Washer) ×6 IMPLANT
YANKAUER SUCT BULB TIP 10FT TU (MISCELLANEOUS) ×3 IMPLANT

## 2017-11-19 NOTE — Op Note (Addendum)
11/19/2017  12:56 PM  PATIENT:  Lenor DerrickJohn Fadeley  84 y.o. male  PRE-OPERATIVE DIAGNOSIS:  Fractured Right Hip  POST-OPERATIVE DIAGNOSIS:  Fractured Right Hip  PROCEDURE:  Procedure(s): INTERNAL FIXATION RIGHT HIP WITH DEPUY ACE CANNULATED SCREWS (Right)-27235  Implants  DePUY ACE TITANIUM CANNULATED SCREWS X 3 W/washers  The surgery was done as follows  The patient was identified in preop the right leg was marked as surgery chart was reviewed.  Patient was taken to surgery general anesthesia was administered  Patient was placed on the fracture table  His left leg was placed in a padded well leg holder his right leg was placed in traction  The C-arm was brought in and position for proper imaging on AP and lateral views.  Fracture was nondisplaced  After sterile prep and drape and timeout surgery began  An incision was made over the greater trochanter extended distally subtenons tissue was divided down to fascia.  Fascia was split in line with skin incision.  Vastus lateralis fascia was then split blunt dissection was carried down to bone superior and inferior retractors were placed.  A K wire threaded was placed into the inferior center portion of the femoral head confirmed by x-ray on multiple planes  Then 2 additional pins were placed with a multi-track pin guide and confirmed to be in position on x-ray.  Each pin was measured and then overdrilled the cortex only and then 3 screws were placed with washers.  The superior screw was removed twice to place a shorter screw which was a 90 mm screw.  The superior most anterior screw was 95 mm in the most inferior screw was 100 mm  Final imaging was obtained pins were in place excellent position was noted  Wounds were irrigated and closed with layered fashion starting with 0 Monocryl then #1 Braylon and then 2-0 Monocryl and staples  We injected 30 cc of Marcaine with epinephrine subfascial.  Postop plan Weightbearing as tolerated Staples  removed at 2 weeks X-rays at 4 weeks 8 weeks and 12 weeks DVT prevention aspirin 28 days  SURGEON:  Surgeon(s) and Role:    * Vickki HearingHarrison, Stanley E, MD - Primary  PHYSICIAN ASSISTANT:   ASSISTANTS: betty ashley    ANESTHESIA:   general  EBL:  10 mL   BLOOD ADMINISTERED:none  DRAINS: none   LOCAL MEDICATIONS USED:  MARCAINE     SPECIMEN:  No Specimen  DISPOSITION OF SPECIMEN:  N/A  COUNTS:  YES  TOURNIQUET:  * No tourniquets in log *  DICTATION: .Dragon Dictation  PLAN OF CARE: Admit to inpatient   PATIENT DISPOSITION:  PACU - hemodynamically stable.   Delay start of Pharmacological VTE agent (>24hrs) due to surgical blood loss or risk of bleeding: no

## 2017-11-19 NOTE — Progress Notes (Addendum)
Writer called and spoke over the phone with Sherlyn Lickarrie Nines and Wel[place]inda [place] [facility] [place] [facility] who have consented to patient having surgery on his right hip and consented to blood products to be given if needed. Writer went over surgery and blood product consent paper and Weldon InchesLucinda Wilson expressed full understanding. (2nd Nurse present for witness of consents, see paper form).  Patient is currently lying on his left side asleep but is easily arousable when nurse enters room and calls his name. Patient does not appear to be in any kind of pain or distress currently. Report given to OR nurse and patient awaiting to go to the OR.  Phone numbers for [facility] are:  Weldon InchesLucinda Wilson Main Case Worker: [phone] extension: [phone] Cell number [phone]  Another number that can be tried is [phone]

## 2017-11-19 NOTE — Clinical Social Work Note (Addendum)
Clinical Social Work Assessment  Patient Details  Name: Chad Everett MRN: [id number] Date of Birth: 15-Feb-1931  Date of referral:  11/19/17               Reason for consult:  Facility Placement, Discharge Planning                Permission sought to share information with:    Permission granted to share information::  Yes, Verbal Permission Granted  Name::        Ag[name]  Relationship::     Contact Information:     Housing/Transportation Living arrangements for the past 2 months:  [facility] Source of Information:  Guardian Patient Interpreter Needed:  None Criminal Activity/Legal Involvement Pertinent to Current Situation/Hospitalization:  No - Comment as needed Significant Relationships:  Other Family Members Lives with:  Facility Resident Do you feel safe going back to the place where you live?  Yes Need for family participation in patient care:  No (Coment)  Care giving concerns: Pt resides at an ALF. He needs STR at SNF per PT. Pt has a guardian.    Social Worker assessment / plan: Pt is an 84 year old male referred to CSW for SNF placement for STR. Reviewed pt's record today. Pt has a [place] [facility], Weldon InchesLucinda Wilson at [phone]. Spoke with Lucinda today. Pt has dementia and is only oriented to self. [facility]. Discussed STR with guardian. She [name], [facility]. Plan is for pt to return to the ALF after his STR.  Employment status:  Retired Health and safety inspectornsurance information:  Medicare PT Recommendations:  Skilled Nursing Facility Information / Referral to community resources:     Patient/Family's Response to care: Pt's guardian responsive to care.  Patient/Family's Understanding of and Emotional Response to Diagnosis, Current Treatment, and Prognosis: Pt has demential. Guardian appears to understand diagnosis and treatment  recommendations.  Emotional Assessment Appearance:  Appears stated age Attitude/Demeanor/Rapport:    Affect (typically observed):  Unable to Assess Orientation:  Oriented to Self Alcohol / Substance use:    Psych involvement (Current and /or in the community):  No (Comment)  Discharge Needs  Concerns to be addressed:  Discharge Planning Concerns Readmission within the last 30 days:  No Current discharge risk:  Physical Impairment Barriers to Discharge:  No Barriers Identified   Elliot GaultKathleen Aradhana Gin, LCSW 11/19/2017, 11:10 AM

## 2017-11-19 NOTE — Anesthesia Procedure Notes (Signed)
Procedure Name: LMA Insertion Date/Time: 11/19/2017 11:41 AM Performed by: Yolonda Kidaarver, Jillisa Harris L, CRNA Pre-anesthesia Checklist: Patient identified, Emergency Drugs available, Suction available and Patient being monitored Patient Re-evaluated:Patient Re-evaluated prior to induction Oxygen Delivery Method: Circle system utilized Preoxygenation: Pre-oxygenation with 100% oxygen Induction Type: IV induction Ventilation: Mask ventilation without difficulty LMA: LMA inserted LMA Size: 4.0 Number of attempts: 1 Placement Confirmation: positive ETCO2,  CO2 detector and breath sounds checked- equal and bilateral Tube secured with: Tape Dental Injury: Teeth and Oropharynx as per pre-operative assessment  Comments: Performed by Dr. Molli Poseyameransi

## 2017-11-19 NOTE — Progress Notes (Signed)
Patient ID: Chad DerrickJohn Everett, male   DOB: 1931/02/02, 84 y.o.   MRN: [id number] I have spoken to the custodian for Chad Everett at [phone]  The risks and benefits of surgery were discussed with her which include but are not limited to  If surgery is not done there is a risk of displacement of the fracture which will require more extensive surgery i.e. bipolar hip replacement versus planned internal fixation with cannulated screws.  Further risks of not doing surgery include patient will be in bed can develop bedsores deep vein thrombosis pulmonary embolus urinary tract infection pneumonia  If the surgery is done we can mobilize the patient immediately and decrease the risk of bedsores deep vein thrombosis pulmonary embolus urinary tract infection and pneumonia and he will get better pain relief  Nash DimmerKerry has informed me that her supervisor will give consent for internal fixation of the right hip

## 2017-11-19 NOTE — Anesthesia Preprocedure Evaluation (Signed)
Anesthesia Evaluation  Patient identified by MRN, date of birth, ID band Patient awake    Reviewed: Allergy & Precautions, H&P , NPO status , Patient's Chart, lab work & pertinent test results  Airway Mallampati: II  TM Distance: >3 FB Neck ROM: limited  Mouth opening: Limited Mouth Opening  Dental no notable dental hx. (+) Edentulous Upper, Edentulous Lower   Pulmonary neg pulmonary ROS, asthma , pneumonia, COPD,  COPD inhaler, former smoker,    Pulmonary exam normal breath sounds clear to auscultation       Cardiovascular Exercise Tolerance: Good hypertension, On Medications +CHF  negative cardio ROS   Rhythm:regular Rate:Normal     Neuro/Psych PSYCHIATRIC DISORDERS Anxiety Dementia negative neurological ROS  negative psych ROS   GI/Hepatic negative GI ROS, Neg liver ROS, GERD  ,  Endo/Other  negative endocrine ROS  Renal/GU negative Renal ROS  negative genitourinary   Musculoskeletal   Abdominal   Peds  Hematology negative hematology ROS (+)   Anesthesia Other Findings Advanced dementia.  Uncooperative to exam.  Verbally non-responsive.  Limited Airway Assessment- edentulous and unable to establish view for Mallampati classification- expect Class II by chin/mandible appearance. Patient is classified as "ward of the state".  Unable to provide consent to pt.  Reproductive/Obstetrics negative OB ROS                             Anesthesia Physical Anesthesia Plan  ASA: IV  Anesthesia Plan: General and General LMA   Post-op Pain Management:    Induction:   PONV Risk Score and Plan:   Airway Management Planned:   Additional Equipment:   Intra-op Plan:   Post-operative Plan:   Informed Consent: I have reviewed the patients History and Physical, chart, labs and discussed the procedure including the risks, benefits and alternatives for the proposed anesthesia with the patient or  authorized representative who has indicated his/her understanding and acceptance.     Plan Discussed with: CRNA  Anesthesia Plan Comments:         Anesthesia Quick Evaluation

## 2017-11-19 NOTE — Brief Op Note (Addendum)
11/19/2017  12:56 PM  PATIENT:  Chad DerrickJohn Everett  84 y.o. male  PRE-OPERATIVE DIAGNOSIS:  Fractured Right Hip  POST-OPERATIVE DIAGNOSIS:  Fractured Right Hip  PROCEDURE:  Procedure(s): INTERNAL FIXATION RIGHT HIP WITH DEPUY ACE CANNULATED SCREWS (Right)  Implants  DePUY ACE TITANIUM CANNULATED SCREWS X 3 W/washers  The surgery was done as follows  The patient was identified in preop the right leg was marked as surgery chart was reviewed.  Patient was taken to surgery general anesthesia was administered  Patient was placed on the fracture table  His left leg was placed in a padded well leg holder his right leg was placed in traction  The C-arm was brought in and position for proper imaging on AP and lateral views.  Fracture was nondisplaced  After sterile prep and drape and timeout surgery began  An incision was made over the greater trochanter extended distally subtenons tissue was divided down to fascia.  Fascia was split in line with skin incision.  Vastus lateralis fascia was then split blunt dissection was carried down to bone superior and inferior retractors were placed.  A K wire threaded was placed into the inferior center portion of the femoral head confirmed by x-ray on multiple planes  Then 2 additional pins were placed with a multi-track pin guide and confirmed to be in position on x-ray.  Each pin was measured and then overdrilled the cortex only and then 3 screws were placed with washers.  The superior screw was removed twice to place a shorter screw which was a 90 mm screw.  The superior most anterior screw was 95 mm in the most inferior screw was 100 mm  Final imaging was obtained pins were in place excellent position was noted  Wounds were irrigated and closed with layered fashion starting with 0 Monocryl then #1 Braylon and then 2-0 Monocryl and staples  We injected 30 cc of Marcaine with epinephrine subfascial.  Postop plan Weightbearing as tolerated Staples  removed at 2 weeks X-rays at 4 weeks 8 weeks and 12 weeks DVT prevention aspirin 28 days  SURGEON:  Surgeon(s) and Role:    * Vickki HearingHarrison, Stanley E, MD - Primary  PHYSICIAN ASSISTANT:   ASSISTANTS: betty ashley    ANESTHESIA:   general  EBL:  10 mL   BLOOD ADMINISTERED:none  DRAINS: none   LOCAL MEDICATIONS USED:  MARCAINE     SPECIMEN:  No Specimen  DISPOSITION OF SPECIMEN:  N/A  COUNTS:  YES  TOURNIQUET:  * No tourniquets in log *  DICTATION: .Dragon Dictation  PLAN OF CARE: Admit to inpatient   PATIENT DISPOSITION:  PACU - hemodynamically stable.   Delay start of Pharmacological VTE agent (>24hrs) due to surgical blood loss or risk of bleeding: no

## 2017-11-19 NOTE — Progress Notes (Signed)
PROGRESS NOTE    Chad Everett  RUE:[id number] DOB: 28-Mar-1931 DOA: 11/18/2017 PCP: [facility]    Brief Narrative:  84 year old male with a history of dementia who is a resident of an assisted living facility, was brought to the hospital after suffering a fall.  He was found to have a right-sided hip fracture and underwent operative management by orthopedics on 4/11.  Will likely need placement to skilled nursing facility.  Physical therapy consultation pending.   Assessment & Plan:   Principal Problem:   Closed right hip fracture, initial encounter (HCC) Active Problems:   Dementia   GERD (gastroesophageal reflux disease)   Anxiety   COPD (chronic obstructive pulmonary disease) (HCC)   Hyperlipidemia   Malnutrition of moderate degree   1. Right hip fracture.  Seen by orthopedics and underwent internal fixation.  Further postop care per orthopedics.  Physical therapy evaluation.  Will likely need placement. 2. Dementia.  Vascular.  Continue supportive care 3. GERD.  Continue on PPI 4. COPD.  No evidence of shortness of breath or wheezing at this time.  Continue bronchodilators as needed. 5. Hyperlipidemia.  Continue statin 6. Moderate malnutrition.  Nutrition following. 7. Leukocytosis.  Suspect this is reactive to underlying hip fracture.  No signs of infection at this time.  Continue to monitor.   DVT prophylaxis: Aspirin per orthopedics Code Status: DNR Family Communication: Care was communicated with guardian at department of social services Disposition Plan: Skilled nursing facility placement on discharge   Consultants:   Orthopedics, Dr. Romeo Apple  Procedures:   4/11: Internal fixation of right hip  Antimicrobials:       Subjective: Patient is sleeping on my arrival.  Does not respond to voice.  Was awake earlier in the night.  Objective: Vitals:   11/19/17 1315 11/19/17 1330 11/19/17 1345 11/19/17 1412  BP: (!) 160/74 (!) 160/78  (!) 169/79 (!) 149/68  Pulse: 83 82 76 76  Resp: 11 10 10 16   Temp:    98.8 F (37.1 C)  TempSrc:    Oral  SpO2: 100% 94% 99% 91%  Weight:      Height:        Intake/Output Summary (Last 24 hours) at 11/19/2017 1706 Last data filed at 11/19/2017 1345 Gross per 24 hour  Intake 900 ml  Output 60 ml  Net 840 ml   Filed Weights   11/19/17 0416  Weight: 55.2 kg (121 lb 11.1 oz)    Examination:  General exam: Patient is sleeping, difficult to awake Respiratory system: Clear to auscultation. Respiratory effort normal. Cardiovascular system: S1 & S2 heard, RRR. No JVD, murmurs, rubs, gallops or clicks.  Gastrointestinal system: Abdomen is nondistended, soft and nontender. No organomegaly or masses felt. Normal bowel sounds heard. Central nervous system: No focal neurological deficits. Extremities: No pedal edema bilaterally Skin: No rashes, lesions or ulcers Psychiatry: Unable to assess since he is somnolent    Data Reviewed: I have personally reviewed following labs and imaging studies  CBC: Recent Labs  Lab 11/18/17 2237 11/19/17 0435  WBC 20.0* 16.1*  NEUTROABS 17.4* 13.9*  HGB 12.8* 11.3*  HCT 39.2 34.6*  MCV 94.7 94.3  PLT 301 284   Basic Metabolic Panel: Recent Labs  Lab 11/18/17 2237 11/19/17 0435  NA 135 135  K 4.2 3.9  CL 100* 100*  CO2 25 24  GLUCOSE 121* 129*  BUN 16 18  CREATININE 0.65 0.72  CALCIUM 9.1 8.7*  MG 1.8  --  PHOS 3.0  --    GFR: Estimated Creatinine Clearance: 52.7 mL/min (by C-G formula based on SCr of 0.72 mg/dL). Liver Function Tests: Recent Labs  Lab 11/18/17 2237  AST 24  ALT 29  ALKPHOS 104  BILITOT 1.2  PROT 7.5  ALBUMIN 3.7   No results for input(s): LIPASE, AMYLASE in the last 168 hours. No results for input(s): AMMONIA in the last 168 hours. Coagulation Profile: Recent Labs  Lab 11/18/17 2237  INR 1.00   Cardiac Enzymes: No results for input(s): CKTOTAL, CKMB, CKMBINDEX, TROPONINI in the last 168  hours. BNP (last 3 results) No results for input(s): PROBNP in the last 8760 hours. HbA1C: No results for input(s): HGBA1C in the last 72 hours. CBG: No results for input(s): GLUCAP in the last 168 hours. Lipid Profile: No results for input(s): CHOL, HDL, LDLCALC, TRIG, CHOLHDL, LDLDIRECT in the last 72 hours. Thyroid Function Tests: No results for input(s): TSH, T4TOTAL, FREET4, T3FREE, THYROIDAB in the last 72 hours. Anemia Panel: No results for input(s): VITAMINB12, FOLATE, FERRITIN, TIBC, IRON, RETICCTPCT in the last 72 hours. Sepsis Labs: No results for input(s): PROCALCITON, LATICACIDVEN in the last 168 hours.  No results found for this or any previous visit (from the past 240 hour(s)).       Radiology Studies: Dg Knee 2 Views Right  Result Date: 11/18/2017 CLINICAL DATA:  Knee pain after fall EXAM: RIGHT KNEE - 1-2 VIEW COMPARISON:  None. FINDINGS: No fracture or malalignment. Mild patellofemoral degenerative change. Moderate degenerative change of the lateral compartment with narrowing and spurring. No large effusion IMPRESSION: Mild to moderate arthritis.  No acute osseous abnormality. Electronically Signed   By: Jasmine Pang M.D.   On: 11/18/2017 20:19   Ct Head Wo Contrast  Result Date: 11/18/2017 CLINICAL DATA:  Larey Seat at nursing home today.  Dementia. EXAM: CT HEAD WITHOUT CONTRAST CT CERVICAL SPINE WITHOUT CONTRAST TECHNIQUE: Multidetector CT imaging of the head and cervical spine was performed following the standard protocol without intravenous contrast. Multiplanar CT image reconstructions of the cervical spine were also generated. COMPARISON:  10/21/2014 FINDINGS: CT HEAD FINDINGS Brain: No evidence of acute infarction, hemorrhage, hydrocephalus, extra-axial collection or mass lesion/mass effect. There is ventricular and sulcal enlargement reflecting moderate to advanced atrophy. Periventricular white matter hypoattenuation is also noted consistent with mild chronic  microvascular ischemic change. Vascular: No hyperdense vessel or unexpected calcification. Skull: Normal. Negative for fracture or focal lesion. Sinuses/Orbits: Globes and orbits are unremarkable. Visualized sinuses and mastoid air cells are clear. Other: None. CT CERVICAL SPINE FINDINGS Alignment: Normal. Skull base and vertebrae: No acute fracture. No primary bone lesion or focal pathologic process. There is some deformity of the C2 vertebra that suggests an old, healed fracture. Soft tissues and spinal canal: No prevertebral fluid or swelling. No visible canal hematoma. Disc levels: Mild disc and facet degenerative changes. Disc degenerative change greatest at C3-C4. No convincing disc herniation. Upper chest: No acute findings. No masses or adenopathy. Pleuroparenchymal scarring at the lung apices. Other: None. IMPRESSION: HEAD CT 1. No acute intracranial abnormalities. 2. Atrophy and chronic microvascular ischemic change. 3. No skull fracture. CERVICAL CT 1. No fracture or acute finding Electronically Signed   By: Amie Portland M.D.   On: 11/18/2017 20:04   Ct Cervical Spine Wo Contrast  Result Date: 11/18/2017 CLINICAL DATA:  Larey Seat at nursing home today.  Dementia. EXAM: CT HEAD WITHOUT CONTRAST CT CERVICAL SPINE WITHOUT CONTRAST TECHNIQUE: Multidetector CT imaging of the head and cervical spine  was performed following the standard protocol without intravenous contrast. Multiplanar CT image reconstructions of the cervical spine were also generated. COMPARISON:  10/21/2014 FINDINGS: CT HEAD FINDINGS Brain: No evidence of acute infarction, hemorrhage, hydrocephalus, extra-axial collection or mass lesion/mass effect. There is ventricular and sulcal enlargement reflecting moderate to advanced atrophy. Periventricular white matter hypoattenuation is also noted consistent with mild chronic microvascular ischemic change. Vascular: No hyperdense vessel or unexpected calcification. Skull: Normal. Negative for  fracture or focal lesion. Sinuses/Orbits: Globes and orbits are unremarkable. Visualized sinuses and mastoid air cells are clear. Other: None. CT CERVICAL SPINE FINDINGS Alignment: Normal. Skull base and vertebrae: No acute fracture. No primary bone lesion or focal pathologic process. There is some deformity of the C2 vertebra that suggests an old, healed fracture. Soft tissues and spinal canal: No prevertebral fluid or swelling. No visible canal hematoma. Disc levels: Mild disc and facet degenerative changes. Disc degenerative change greatest at C3-C4. No convincing disc herniation. Upper chest: No acute findings. No masses or adenopathy. Pleuroparenchymal scarring at the lung apices. Other: None. IMPRESSION: HEAD CT 1. No acute intracranial abnormalities. 2. Atrophy and chronic microvascular ischemic change. 3. No skull fracture. CERVICAL CT 1. No fracture or acute finding Electronically Signed   By: Amie Portland M.D.   On: 11/18/2017 20:04   Ct Hip Right Wo Contrast  Result Date: 11/18/2017 CLINICAL DATA:  Patient fell today. Unable to straighten legs. Right hip fracture. EXAM: CT OF THE RIGHT HIP WITHOUT CONTRAST TECHNIQUE: Multidetector CT imaging of the right hip was performed according to the standard protocol. Multiplanar CT image reconstructions were also generated. COMPARISON:  Same day radiographs of the right hip. FINDINGS: Bones/Joint/Cartilage Acute subcapital right femoral neck fracture with impaction of the neck on the head along its superolateral aspect is identified, series 6/36, series 2/42. Associated small joint effusion. No joint dislocation. No acetabular or pubic rami fracture. The included sacrum and coccyx are intact. No pelvic diastasis. Ligaments Suboptimally assessed by CT. Muscles and Tendons No intramuscular hemorrhage or atrophy. Soft tissues Soft tissue edema compatible with soft tissue contusion overlying the right hip. No focal soft tissue hematoma. Moderate fecal retention  within the included colon and rectum. IMPRESSION: 1. Acute, closed, subcapital right femoral neck fracture with impaction of the femoral neck on the femoral head along its superolateral aspect. Small joint effusion. No joint dislocation. 2. Increased stool burden within the included colon and rectum. Electronically Signed   By: Tollie Eth M.D.   On: 11/18/2017 21:39   Dg Chest Port 1 View  Result Date: 11/18/2017 CLINICAL DATA:  Hip fracture EXAM: PORTABLE CHEST 1 VIEW COMPARISON:  06/24/2016 FINDINGS: Low lung volumes. No acute consolidation or effusion. Cardiomediastinal silhouette within normal limits. Aortic atherosclerosis. No pneumothorax. IMPRESSION: No active disease. Electronically Signed   By: Jasmine Pang M.D.   On: 11/18/2017 22:35   Dg Hip Operative Unilat With Pelvis Right  Result Date: 11/19/2017 CLINICAL DATA:  Internal fixation right hip EXAM: OPERATIVE right HIP (WITH PELVIS IF PERFORMED) 7 VIEWS TECHNIQUE: Fluoroscopic spot image(s) were submitted for interpretation post-operatively. COMPARISON:  11/18/2017 FINDINGS: Seven low resolution intraoperative spot views of the right hip obtained during threaded screw fixation of right subcapital femur fracture. Anatomic alignment. Total fluoroscopy time was 63 seconds. IMPRESSION: Intraoperative fluoroscopic assistance provided during surgical fixation of right hip fracture Electronically Signed   By: Jasmine Pang M.D.   On: 11/19/2017 15:30   Dg Hip Unilat W Or Wo Pelvis 2-3 Views Right  Result Date: 11/18/2017 CLINICAL DATA:  Right leg pain after fall EXAM: DG HIP (WITH OR WITHOUT PELVIS) 2-3V RIGHT COMPARISON:  None. FINDINGS: Possible step-off deformity at the right femoral head neck junction of the right hip. No dislocation. Pubic symphysis and rami appear intact. Both femoral heads project in joint. IMPRESSION: Possible subtle subcapital fracture involving the proximal right femur, CT recommended to further evaluate. Electronically  Signed   By: Jasmine PangKim  Fujinaga M.D.   On: 11/18/2017 20:18        Scheduled Meds: . [START ON 11/20/2017] aspirin EC  325 mg Oral Q breakfast  . atorvastatin  40 mg Oral q morning - 10a  . docusate sodium  100 mg Oral BID  . pantoprazole  40 mg Oral Daily  . [START ON 11/20/2017] psyllium  1 packet Oral Daily   Continuous Infusions: .  ceFAZolin (ANCEF) IV       LOS: 1 day    Erick BlinksJehanzeb Shelli Portilla, MD [facility] Pager [phone]  If 7PM-7AM, please contact night-coverage [email or web address] Password [email or web address] 11/19/2017, 5:06 PM

## 2017-11-19 NOTE — Progress Notes (Signed)
Initial Nutrition Assessment  DOCUMENTATION CODES:   Non-severe (moderate) malnutrition in context of chronic illness, Underweight  INTERVENTION:  RD to follow diet advanced add nutritional supplements as indicated   NUTRITION DIAGNOSIS:   Moderate Malnutrition related to chronic illness(dementia) as evidenced by per patient/family report, percent weight loss and mild to moderate muscle and fat loss.  GOAL:  Patient will meet greater than or equal to 90% of their needs(If feasible given pt significant dementia)   MONITOR:   Diet advancement, PO intake, Weight trends, Skin, Labs, Supplement acceptance REASON FOR ASSESSMENT:   Consult Assessment of nutrition requirement/status, Hip fracture protocol  ASSESSMENT:  Patient is a 84 yo male who [facility]. He has severe dementia, GERD, HTN and HLD. He had an unobserved fall at facility and x-ray findings report: acute subcapital right femoral neck fracture.  He is s/p internal fixation right hip.   Family at bedside: son, granddaughter. According to their recall pt remains able to feed himself. Family says he has been eating a regular diet. He does have a hx of dysphagia and had a MBSS March 2016 was on a dysphagia 1 diet thin liquids.     He has mild to moderate muscle and fat loss in multiple regions noted below.   Labs: BMP Latest Ref Rng & Units 11/19/2017 11/18/2017 06/24/2016  Glucose 65 - 99 mg/dL 981(X129(H) 914(N121(H) 829(F137(H)  BUN 6 - 20 mg/dL 18 16 14   Creatinine 0.61 - 1.24 mg/dL 6.210.72 3.080.65 6.570.79  Sodium 135 - 145 mmol/L 135 135 137  Potassium 3.5 - 5.1 mmol/L 3.9 4.2 3.9  Chloride 101 - 111 mmol/L 100(L) 100(L) 103  CO2 22 - 32 mmol/L 24 25 28   Calcium 8.9 - 10.3 mg/dL 8.4(O8.7(L) 9.1 9.6(E8.6(L)    Scheduled Meds: . [START ON 11/20/2017] aspirin EC  325 mg Oral Q breakfast  . atorvastatin  40 mg Oral q morning - 10a  . docusate sodium  100 mg Oral BID  . pantoprazole  40 mg Oral Daily  . [START  ON 11/20/2017] psyllium  1 packet Oral Daily   Continuous Infusions: .  ceFAZolin (ANCEF) IV     PRN Meds:.acetaminophen, guaifenesin, loperamide, menthol-cetylpyridinium **OR** phenol, metoCLOPramide **OR** metoCLOPramide (REGLAN) injection, morphine injection, ondansetron **OR** ondansetron (ZOFRAN) IV, traMADol, TRIPLE ANTIBIOTIC   NUTRITION - FOCUSED PHYSICAL EXAM:    Most Recent Value  Orbital Region  Mild depletion  Upper Arm Region  Moderate depletion  Thoracic and Lumbar Region  Mild depletion  Buccal Region  Mild depletion  Temple Region  Moderate depletion  Clavicle Bone Region  Mild depletion  Clavicle and Acromion Bone Region  Moderate depletion  Scapular Bone Region  Moderate depletion  Edema (RD Assessment)  None  Hair  Reviewed  Eyes  Unable to assess  Mouth  Unable to assess  Skin  Reviewed  Nails  Unable to assess     Diet Order:  No diet orders on file  EDUCATION NEEDS:  No education needs have been identified at this time   Skin:  Skin Assessment: Skin Integrity Issues: Skin Integrity Issues:: (pt also has an abrasion to his face) Incisions: right hip   Last BM:  4/10   Height:   Ht Readings from Last 1 Encounters:  11/19/17 5\' 8"  (1.727 m)    Weight:   Wt Readings from Last 1 Encounters:  11/19/17 121 lb 11.1 oz (55.2 kg)    Ideal Body Weight:  70 kg  BMI:  Body mass index is 18.5 kg/m.  Estimated Nutritional Needs:   Kcal:  0981-1914  Protein:  70-75 gr  Fluid:  >1500 ml daily   Royann Shivers MS,RD,CSG,LDN Office: [phone] Pager: [phone]

## 2017-11-19 NOTE — Transfer of Care (Signed)
Immediate Anesthesia Transfer of Care Note  Patient: Chad DerrickJohn Everett  Procedure(s) Performed: INTERNAL FIXATION RIGHT HIP WITH DEPUY ACE CANNULATED SCREWS (Right )  Patient Location: PACU  Anesthesia Type:General  Level of Consciousness: drowsy  Airway & Oxygen Therapy: Patient Spontanous Breathing and Patient connected to face mask oxygen  Post-op Assessment: Report given to RN and Post -op Vital signs reviewed and stable  Post vital signs: Reviewed and stable  Last Vitals:  Vitals Value Taken Time  BP    Temp    Pulse    Resp    SpO2      Last Pain:  Vitals:   11/19/17 1118  TempSrc: Axillary  PainSc:          Complications: No apparent anesthesia complications

## 2017-11-19 NOTE — Interval H&P Note (Signed)
History and Physical Interval Note:  11/19/2017 10:39 AM  Lenor DerrickJohn Laughridge  has presented today for surgery, with the diagnosis of RIGHT HIP FRACTURE  The various methods of treatment have been discussed with the patient and family. After consideration of risks, benefits and other options for treatment, the patient has consented to  Procedure(s): CANNULATED HIP PINNING (Right) as a surgical intervention .  The patient's history has been reviewed, patient examined, no change in status, stable for surgery.  I have reviewed the patient's chart and labs.  Questions were answered to the patient's satisfaction.     Fuller CanadaStanley Alesa Echevarria

## 2017-11-19 NOTE — Consult Note (Signed)
HOSPITAL CONSULT [id number] (HIP FRACTURE )  MDM= MODERATE COMPLEXITY COMP HISTORY COMP EXAM  Patient ID: Chad Everett, male   DOB: [birth year], 84 y.o.   MRN: [id number]  New patient  Requested by: Veneda MelterJ MEMON  Reason for: Right hip fracture  Chief Complaint  Patient presents with  . Fall     Chad DerrickJohn Mcphearson is a [age] y.o. male.   HPI 84 [place] old [place] unwitnessed fall complains of right hip pain I consulted with Weldon InchesLucinda Wilson [facility] and she says the patient does ambulate  He has pain in his right hip  He has severe dementia  Location right hip Duration injury date April 10 Severity cannot assess due to dementia Quality cannot assess due to dementia Modified by movement causes increased pain  Review of Systems (all) Review of Systems  Unable to perform ROS: Dementia    Past Medical History:  Diagnosis Date  . Anxiety   . Asthma   . Dementia   . GERD (gastroesophageal reflux disease)   . Hyperlipidemia   . Hypertension   . Pneumonia      History reviewed. No pertinent surgical history.   Family History  Problem Relation Age of Onset  . Stroke Sister     Social History Social History   Tobacco Use  . Smoking status: Former Smoker    Packs/day: 0.50    Years: 20.00    Pack years: 10.00    Types: Cigarettes    Last attempt to quit: 08/12/1983    Years since quitting: 34.2  . Smokeless tobacco: Never Used  Substance Use Topics  . Alcohol use: No  . Drug use: No     Allergies  Allergen Reactions  . Onion Other (See Comments)    Indigestion    Current Facility-Administered Medications  Medication Dose Route Frequency Provider Last Rate Last Dose  . morphine 2 MG/ML injection 2 mg  2 mg Intravenous Q2H PRN Bobette Mortiz, David Manuel, MD   2 mg at 11/19/17 0450     Physical Exam(=30) Blood pressure 127/62, pulse 65, temperature 98.2 F (36.8 C), temperature source Axillary, resp. rate 14, height 5'  8" (1.727 m), weight 121 lb 11.1 oz (55.2 kg), SpO2 92 %. Gen. Appearance small framed in fetal position Peripheral vascular system no peripheral edema normal pulse normal color normal perfusion normal capillary refill in all 4 extremities Lymph nodes supraclavicular region no lymph nodes Gait cannot assess but social service worker who has him as a patient and client says he does walk  Left Upper extremity  Again he is in a fetal ball  Inspection revealed no malalignment or asymmetry  Assessment of range of motion: Seems contracted  Assessment of stability: Stable joints  Assessment of muscle normal muscle tone  Skin was normal without rash lesion or ulceration  Right upper extremity Again Inspection revealed no malalignment or asymmetry  Assessment of range of motion: Seems contracted  Assessment of stability: Stable joints  Assessment of muscle normal muscle tone  Skin was normal without rash lesion or ulceration  Right Lower extremity Bruise over the right hip tenderness to palpation at the bruise site Hip flexion knee flexion ankle plantarflexion contracted All joints were reduced and stable Muscle tone was normal Skin was intact   Left lower extremity Inspection again flexed hip flexed knee plantar flexed ankle no tenderness to palpation Range of motion is described Hip knee and ankle reduced stable Muscle tone  normal Skin intact  Coordination dementia prevented coordination testing Could not assess Deep tendon secondary to dementia and fetal position, toes were downgoing bilateral  examination of sensation by touch was normal  Mental status Not oriented to person place or time mood and affect could not assess secondary to dementia  Dx: Right femoral neck fracture nondisplaced  Data Reviewed  I reviewed the following images and the reports and my independent interpretation is nondisplaced right femoral neck fracture   Assessment  84 year old male right hip  fracture nondisplaced would benefit from cannulated screws to stabilize the fracture prevent displacement and the need for hip replacement surgery he would also benefit him for pain control and mobility to prevent complications of hip fracture including but not limited to DVT pulmonary embolism bedsore pneumonia urinary tract infection  Plan After discussion with social services they have given consent to place cannulated screws  Phone numbers [phone] extension [phone] or cell phone #[phone] Weldon Inches  After Hours [phone] emergencies  Vickki Hearing MD

## 2017-11-19 NOTE — Progress Notes (Signed)
Patient ID: Chad Everett, male   DOB: Feb 16, 1931, 84 y.o.   MRN: [id number]  Preoperative note  Diagnosis right hip fracture nondisplaced femoral neck  Plan surgical treatment internal fixation with cannulated screws  CBC  CBC Latest Ref Rng & Units 11/19/2017 11/18/2017 06/24/2016  WBC 4.0 - 10.5 K/uL 16.1(H) 20.0(H) 8.3  Hemoglobin 13.0 - 17.0 g/dL 11.3(L) 12.8(L) 13.5  Hematocrit 39.0 - 52.0 % 34.6(L) 39.2 40.5  Platelets 150 - 400 K/uL 284 301 305    BASIC metabolic panel BMP Latest Ref Rng & Units 11/19/2017 11/18/2017 06/24/2016  Glucose 65 - 99 mg/dL 409(W129(H) 119(J121(H) 478(G137(H)  BUN 6 - 20 mg/dL 18 16 14   Creatinine 0.61 - 1.24 mg/dL 9.560.72 2.130.65 0.860.79  Sodium 135 - 145 mmol/L 135 135 137  Potassium 3.5 - 5.1 mmol/L 3.9 4.2 3.9  Chloride 101 - 111 mmol/L 100(L) 100(L) 103  CO2 22 - 32 mmol/L 24 25 28   Calcium 8.9 - 10.3 mg/dL 5.7(Q8.7(L) 9.1 4.6(N8.6(L)     PT/INR 13 and 1.0  BLOOD UNITS AVAILABLE sample expiration is April 13 antibody screen negative ABO type is a positive  Chest x-ray  CLINICAL DATA:  Hip fracture   EXAM: PORTABLE CHEST 1 VIEW   COMPARISON:  06/24/2016   FINDINGS: Low lung volumes. No acute consolidation or effusion. Cardiomediastinal silhouette within normal limits. Aortic atherosclerosis. No pneumothorax.   IMPRESSION: No active disease.     Electronically Signed   By: Jasmine PangKim  Fujinaga M.D.   On: 11/18/2017 22:35   Plain film findings nondisplaced right hip fracture OR NOTIFIED yes  [phone] has been notified and has given informed consent

## 2017-11-19 NOTE — Progress Notes (Signed)
Patient ID: Chad DerrickJohn Everett, male   DOB: 1931/01/11, 84 y.o.   MRN: [id number]  Communications update  The patient's social worker is Weldon InchesLucinda Wilson  Phone #[phone] extension [phone] or cell phone [phone]  After hours number [phone]  I have confirmed with Ms. Andrey CampanileWilson that the patient does ambulate

## 2017-11-19 NOTE — NC FL2 (Signed)
  [place] MEDICAID FL2 LEVEL OF CARE SCREENING TOOL     IDENTIFICATION  Patient Name: Chad Everett Birthdate: Aug 11, 1931 Sex: male Admission Date (Current Location): 11/18/2017  [facility] [place]   Facility and Address:  [facility],  [address], [place] [id number]      Provider Number: [id number]  Attending Physician Name and Address:  Erick BlinksMemon, Jehanzeb, MD  Relative Name and Phone Number:  Weldon InchesLucinda Wilson (Guardian) [phone] ext [phone]    Current Level of Care: Hospital Recommended Level of Care: Skilled Nursing Facility Prior Approval Number:    Date Approved/Denied:   PASRR Number: [id number] [id number]  Discharge Plan: SNF    Current Diagnoses: Patient Active Problem List   Diagnosis Date Noted  . Closed right hip fracture, initial encounter (HCC) 11/18/2017  . Hyperlipidemia 11/18/2017  . Encephalopathy 10/22/2014  . Acute dyspnea 10/21/2014  . HCAP (healthcare-associated pneumonia) 10/21/2014  . Diastolic heart failure (HCC) 10/21/2014  . Aspiration pneumonia (HCC) 08/13/2014  . COPD (chronic obstructive pulmonary disease) (HCC) 08/13/2014  . Acute respiratory failure with hypoxia (HCC) 08/13/2014  . Hypokalemia 08/13/2014  . Chronic diastolic congestive heart failure (HCC) 08/13/2014  . Weakness 08/11/2014  . Near syncope 05/17/2013  . Syncope 05/17/2013  . Dementia   . GERD (gastroesophageal reflux disease)   . Anxiety     Orientation RESPIRATION BLADDER Height & Weight     Self  Normal Incontinent Weight: 121 lb 11.1 oz (55.2 kg) Height:  5\' 8"  (172.7 cm)  BEHAVIORAL SYMPTOMS/MOOD NEUROLOGICAL BOWEL NUTRITION STATUS      Continent Diet(see discharge summary)  AMBULATORY STATUS COMMUNICATION OF NEEDS Skin   Extensive Assist (pt has dementia) Normal                       Personal Care Assistance Level of Assistance  Bathing, Feeding, Dressing Bathing Assistance: Limited assistance Feeding assistance: Limited  assistance Dressing Assistance: Maximum assistance     Functional Limitations Info  Sight, Hearing, Speech Sight Info: Adequate Hearing Info: Adequate Speech Info: Adequate    SPECIAL CARE FACTORS FREQUENCY  PT (By licensed PT)     PT Frequency: 5 times week              Contractures Contractures Info: Not present    Additional Factors Info  Code Status, Allergies Code Status Info: DNR Allergies Info: onion           Current Medications (11/19/2017):  This is the current hospital active medication list Current Facility-Administered Medications  Medication Dose Route Frequency Provider Last Rate Last Dose  . ceFAZolin (ANCEF) IVPB 2g/100 mL premix  2 g Intravenous On Call to OR Vickki HearingHarrison, Stanley E, MD      . chlorhexidine (HIBICLENS) 4 % liquid 4 application  60 mL Topical Once Vickki HearingHarrison, Stanley E, MD      . lactated ringers infusion   Intravenous Continuous Cameransi, Candis SchatzBenjamin G, MD      . Mitzi Hansen[MAR Hold] morphine 2 MG/ML injection 2 mg  2 mg Intravenous Q2H PRN Bobette Mortiz, David Manuel, MD   2 mg at 11/19/17 0450  . povidone-iodine 10 % swab 2 application  2 application Topical Once Vickki HearingHarrison, Stanley E, MD         Discharge Medications: Please see discharge summary for a list of discharge medications.  Relevant Imaging Results:  Relevant Lab Results:   Additional Information SSN: [id number]  Nataleah Scioneaux, [place]

## 2017-11-19 NOTE — Anesthesia Postprocedure Evaluation (Signed)
Anesthesia Post Note  Patient: Chad DerrickJohn Everett  Procedure(s) Performed: INTERNAL FIXATION RIGHT HIP WITH DEPUY ACE CANNULATED SCREWS (Right )  Patient location during evaluation: PACU Anesthesia Type: General Level of consciousness: awake Pain management: pain level controlled (patient does not appear in pain or distress) Vital Signs Assessment: post-procedure vital signs reviewed and stable Respiratory status: spontaneous breathing, nonlabored ventilation, respiratory function stable and patient connected to nasal cannula oxygen Cardiovascular status: stable Postop Assessment: no apparent nausea or vomiting Anesthetic complications: no     Last Vitals:  Vitals:   11/19/17 1315 11/19/17 1330  BP: (!) 160/74 (!) 160/78  Pulse: 83 82  Resp: 11 10  Temp:    SpO2: 100% 94%    Last Pain:  Vitals:   11/19/17 1330  TempSrc:   PainSc: Asleep                 Jannat Rosemeyer L

## 2017-11-19 NOTE — Progress Notes (Signed)
Patient ID: Chad DerrickJohn Everett, male   DOB: 02/08/1931, 84 y.o.   MRN: [id number] Right hip non displaced femoral neck fracture  BP 126/67   Pulse 66   Temp 98.3 F (36.8 C) (Oral)   Resp 15   Ht 5\' 8"  (1.727 m)   Wt 121 lb 11.1 oz (55.2 kg)   SpO2 94%   BMI 18.50 kg/m   Surgery timing will be determined by evaluation by DSS who has custody of the patient

## 2017-11-19 NOTE — H&P (View-Only) (Signed)
HOSPITAL CONSULT [id number] (HIP FRACTURE )  MDM= MODERATE COMPLEXITY COMP HISTORY COMP EXAM  Patient ID: Chad Everett, male   DOB: [birth year], 84 y.o.   MRN: [id number]  New patient  Requested by: Veneda MelterJ MEMON  Reason for: Right hip fracture  Chief Complaint  Patient presents with  . Fall     Chad DerrickJohn Mcphearson is a [age] y.o. male.   HPI 84 [place] old [place] unwitnessed fall complains of right hip pain I consulted with Weldon InchesLucinda Wilson of [facility] and she says the patient does ambulate  He has pain in his right hip  He has severe dementia  Location right hip Duration injury date April 10 Severity cannot assess due to dementia Quality cannot assess due to dementia Modified by movement causes increased pain  Review of Systems (all) Review of Systems  Unable to perform ROS: Dementia    Past Medical History:  Diagnosis Date  . Anxiety   . Asthma   . Dementia   . GERD (gastroesophageal reflux disease)   . Hyperlipidemia   . Hypertension   . Pneumonia      History reviewed. No pertinent surgical history.   Family History  Problem Relation Age of Onset  . Stroke Sister     Social History Social History   Tobacco Use  . Smoking status: Former Smoker    Packs/day: 0.50    Years: 20.00    Pack years: 10.00    Types: Cigarettes    Last attempt to quit: 08/12/1983    Years since quitting: 34.2  . Smokeless tobacco: Never Used  Substance Use Topics  . Alcohol use: No  . Drug use: No     Allergies  Allergen Reactions  . Onion Other (See Comments)    Indigestion    Current Facility-Administered Medications  Medication Dose Route Frequency Provider Last Rate Last Dose  . morphine 2 MG/ML injection 2 mg  2 mg Intravenous Q2H PRN Bobette Mortiz, David Manuel, MD   2 mg at 11/19/17 0450     Physical Exam(=30) Blood pressure 127/62, pulse 65, temperature 98.2 F (36.8 C), temperature source Axillary, resp. rate 14, height 5'  8" (1.727 m), weight 121 lb 11.1 oz (55.2 kg), SpO2 92 %. Gen. Appearance small framed in fetal position Peripheral vascular system no peripheral edema normal pulse normal color normal perfusion normal capillary refill in all 4 extremities Lymph nodes supraclavicular region no lymph nodes Gait cannot assess but social service worker who has him as a patient and client says he does walk  Left Upper extremity  Again he is in a fetal ball  Inspection revealed no malalignment or asymmetry  Assessment of range of motion: Seems contracted  Assessment of stability: Stable joints  Assessment of muscle normal muscle tone  Skin was normal without rash lesion or ulceration  Right upper extremity Again Inspection revealed no malalignment or asymmetry  Assessment of range of motion: Seems contracted  Assessment of stability: Stable joints  Assessment of muscle normal muscle tone  Skin was normal without rash lesion or ulceration  Right Lower extremity Bruise over the right hip tenderness to palpation at the bruise site Hip flexion knee flexion ankle plantarflexion contracted All joints were reduced and stable Muscle tone was normal Skin was intact   Left lower extremity Inspection again flexed hip flexed knee plantar flexed ankle no tenderness to palpation Range of motion is described Hip knee and ankle reduced stable Muscle tone  normal Skin intact  Coordination dementia prevented coordination testing Could not assess Deep tendon secondary to dementia and fetal position, toes were downgoing bilateral  examination of sensation by touch was normal  Mental status Not oriented to person place or time mood and affect could not assess secondary to dementia  Dx: Right femoral neck fracture nondisplaced  Data Reviewed  I reviewed the following images and the reports and my independent interpretation is nondisplaced right femoral neck fracture   Assessment  84 year old male right hip  fracture nondisplaced would benefit from cannulated screws to stabilize the fracture prevent displacement and the need for hip replacement surgery he would also benefit him for pain control and mobility to prevent complications of hip fracture including but not limited to DVT pulmonary embolism bedsore pneumonia urinary tract infection  Plan After discussion with social services they have given consent to place cannulated screws  Phone numbers [phone] extension [phone] or cell phone #[phone] Weldon Inches  After Hours [phone] emergencies  Vickki Hearing MD

## 2017-11-20 ENCOUNTER — Encounter (HOSPITAL_COMMUNITY): Payer: Self-pay | Admitting: Orthopedic Surgery

## 2017-11-20 DIAGNOSIS — S72001G Fracture of unspecified part of neck of right femur, subsequent encounter for closed fracture with delayed healing: Secondary | ICD-10-CM

## 2017-11-20 LAB — BASIC METABOLIC PANEL
Anion gap: 11 (ref 5–15)
BUN: 15 mg/dL (ref 6–20)
CALCIUM: 8.4 mg/dL — AB (ref 8.9–10.3)
CO2: 24 mmol/L (ref 22–32)
CREATININE: 0.69 mg/dL (ref 0.61–1.24)
Chloride: 102 mmol/L (ref 101–111)
GFR calc Af Amer: >60 mL/min (ref >60)
GLUCOSE: 123 mg/dL — AB (ref 65–99)
Potassium: 3.8 mmol/L (ref 3.5–5.1)
SODIUM: 137 mmol/L (ref 135–145)

## 2017-11-20 LAB — CBC
HEMATOCRIT: 35.9 % — AB (ref 39.0–52.0)
Hemoglobin: 11.3 g/dL — ABNORMAL LOW (ref 13.0–17.0)
MCH: 30.6 pg (ref 26.0–34.0)
MCHC: 31.5 g/dL (ref 30.0–36.0)
MCV: 97.3 fL (ref 78.0–100.0)
PLATELETS: 252 10*3/uL (ref 150–400)
RBC: 3.69 MIL/uL — ABNORMAL LOW (ref 4.22–5.81)
RDW: 14.7 % (ref 11.5–15.5)
WBC: 13.9 10*3/uL — ABNORMAL HIGH (ref 4.0–10.5)

## 2017-11-20 NOTE — Care Management Important Message (Signed)
Important Message  Patient Details  Name: Chad Everett MRN: [id number] Date of Birth: Sep 26, 1930   Medicare Important Message Given:  Yes    [name], Ski Polich Smith 11/20/2017, 12:55 PM

## 2017-11-20 NOTE — Clinical Social Work Note (Addendum)
LCSW following. Pt has bed offers for [facility] at [facility] and [facility]. Have left two voicemail messages for pt's [place] [facility] worker Weldon InchesLucinda Wilson [phone]), to update and determine which SNF she would like pt to rehab at. Have not yet received a return call. Have also attempted to reach DSS Supervisor, Marcelino DusterMichelle, [phone]) and unable to reach her either.   Covering LCSW, [employer] CorporationHeather Settle, will further address pt's dc planning needs this afternoon. MD indicated pt will likely be stable for dc on Saturday.  12:00 Received R/C from pt's guardian and she indicates she would like pt to go to [facility] for his rehab. She is coming to see pt today. Asked her to go to [name] to complete admission paperwork which she stated she would do. Contacted Debbie at [name] to update. She asks that W/E CSW contact her on her cell [phone] to notify of dc once it's in. Will leave notes for w/e SW who will follow up.

## 2017-11-20 NOTE — Evaluation (Signed)
Clinical/Bedside Swallow Evaluation Patient Details  Name: Chad Everett MRN: [id number] Date of Birth: [birth year]  Today's Date: 11/20/2017 Time: SLP Start Time (ACUTE ONLY): 1610 SLP Stop Time (ACUTE ONLY): 1625 SLP Time Calculation (min) (ACUTE ONLY): 15 min  Past Medical History:  Past Medical History:  Diagnosis Date  . Anxiety   . Asthma   . Dementia   . GERD (gastroesophageal reflux disease)   . Hyperlipidemia   . Hypertension   . Pneumonia    Past Surgical History:  Past Surgical History:  Procedure Laterality Date  . HIP PINNING,CANNULATED Right 11/19/2017   Procedure: INTERNAL FIXATION RIGHT HIP WITH DEPUY ACE CANNULATED SCREWS;  Surgeon: Vickki Hearing, MD;  Location: [facility];  Service: Orthopedics;  Laterality: Right;   HPI:  Pt is an 84 y.o. male with PMH anxiety, asthma, dementia, GERD, hyperlipidemia, hypertension, history of pneumonia [facility] resulting in R hip fracture. Head CT negative for acute findings. CXR negative. Pt also found to have moderate malnutrition. Family indicates pt can feed himself and was eating a regular diet. Pt had an outpatient MBS in 2016 following an admission for PNA- results were mild oropharyngeal dysphagia- incomplete mastication, delay in swallow initiation, decreased hyolaryngeal excursion, residuals post-swallow cleared with cue to swallow again. Due to memory impairment it was also recommended that pt have supervision with meals. Bedside swallow eval ordered due to hx of dysphagia and recent weight loss.   Assessment / Plan / Recommendation Clinical Impression  Pt showed no overt s/s of aspiration during clinical evaluation, including about 3/4 cup of thin liquid, and several bites of puree and dysphagia 3 consistency, following recommendations from 2016 MBS, without apparent difficulty despite confusion. Pt's significant dementia, along with hx of mild dysphagia, puts him at an increased  risk of aspiration; during this evaluation pt did not make attempts to feed self, remained curled on one side in bed, not consistently answering questions or following 1-step commands. Visual cues/ modeling were beneficial. Recommend dysphagia 3 diet, thin liquids, meds crushed in puree, full supervision during meals to assist with feeding, monitor alertness, decrease distractions. Will f/u x1 for diet tolerance/ family education if available. SLP Visit Diagnosis: Dysphagia, unspecified (R13.10)    Aspiration Risk  Moderate aspiration risk    Diet Recommendation Dysphagia 3 (Mech soft);Thin liquid   Liquid Administration via: Cup;Straw Medication Administration: Crushed with puree Supervision: Staff to assist with self feeding;Full supervision/cueing for compensatory strategies Compensations: Minimize environmental distractions;Slow rate;Small sips/bites Postural Changes: Seated upright at 90 degrees    Other  Recommendations Oral Care Recommendations: Oral care BID   Follow up Recommendations Skilled Nursing facility      Frequency and Duration min 1 x/week  1 week       Prognosis Prognosis for Safe Diet Advancement: Fair Barriers to Reach Goals: Cognitive deficits      Swallow Study   General HPI: Pt is an 84 y.o. male with PMH anxiety, asthma, dementia, GERD, hyperlipidemia, hypertension, history of pneumonia [facility] resulting in R hip fracture. Head CT negative for acute findings. CXR negative. Pt also found to have moderate malnutrition. Family indicates pt can feed himself and was eating a regular diet. Pt had an outpatient MBS in 2016 following an admission for PNA- results were mild oropharyngeal dysphagia- incomplete mastication, delay in swallow initiation, decreased hyolaryngeal excursion, residuals post-swallow cleared with cue to swallow again. Due to memory impairment it was also  recommended that pt have supervision with meals.  Bedside swallow eval ordered due to hx of dysphagia and recent weight loss. Type of Study: Bedside Swallow Evaluation Previous Swallow Assessment: See HPI Diet Prior to this Study: (none in chart) Temperature Spikes Noted: No Respiratory Status: Room air History of Recent Intubation: No Behavior/Cognition: Alert;Cooperative;Requires cueing Oral Cavity Assessment: Within Functional Limits Oral Cavity - Dentition: Edentulous Self-Feeding Abilities: Total assist Patient Positioning: Partially reclined Baseline Vocal Quality: Hoarse Volitional Cough: Cognitively unable to elicit Volitional Swallow: Unable to elicit    Oral/Motor/Sensory Function Overall Oral Motor/Sensory Function: Other (comment)(difficult to assess d/t dementia)   Ice Chips Ice chips: Not tested   Thin Liquid Thin Liquid: Within functional limits Presentation: Straw    Nectar Thick Nectar Thick Liquid: Not tested   Honey Thick Honey Thick Liquid: Not tested   Puree Puree: Within functional limits Presentation: Spoon   Solid   GO   Solid: Within functional limits Presentation: Ace[place] LavSpoon        Amy K Oleksiak, MA, CCC-SLP 11/20/2017,4:36 PM

## 2017-11-20 NOTE — Progress Notes (Signed)
PROGRESS NOTE    Delmas Faucett  WUJ:[id number] DOB: 02-08-1931 DOA: 11/18/2017 PCP: [facility]    Brief Narrative:  84 year old male with a history of dementia who is a resident of an assisted living facility, was brought to the hospital after suffering a fall.  He was found to have a right-sided hip fracture and underwent operative management by orthopedics on 4/11.  Evaluated by physical therapy and will need skilled nursing.  Guardian notified & agreed.  She signed approval forms at [facility] earlier today.   Patient himself remains confused.  No complaints  Assessment & Plan:   Principal Problem:   Closed right hip fracture, subsequent encounter (HCC): Stable.  Awaiting follow-up hemoglobin today to confirm no blood loss anemia following surgery.  Hopefully stable for skilled nursing tomorrow Active Problems:   Dementia without behavioral disturbance: At baseline.  No family, ward of the state   GERD (gastroesophageal reflux disease): Stable continue PPI   Anxiety   COPD (chronic obstructive pulmonary disease) (HCC): Stable, breathing comfortably   Hyperlipidemia   Malnutrition of moderate degree: Patient meets criteria as per reports from facility, percent weight loss and mild to moderate muscle and fat loss.  Followed by nutrition.  1. Leukocytosis: Felt to be secondary to stress margination: Trending downward.  CBC from today pending   DVT prophylaxis: Aspirin per orthopedics Code Status: DNR Family Communication: Guardian at the bedside Disposition Plan: Skilled nursing facility placement, potentially tomorrow   Consultants:   Orthopedics, Dr. Romeo Apple  Procedures:   4/11: Internal fixation of right hip  Antimicrobials:   Preop Ancef    Objective: Vitals:   11/20/17 0617 11/20/17 1017 11/20/17 1144 11/20/17 1411  BP: (!) 164/70 133/67  137/88  Pulse: 70 82  83  Resp:  18  16  Temp: 98.7 F (37.1 C) 97.9 F (36.6 C)  (!) 97.5 F  (36.4 C)  TempSrc: Oral Axillary  Oral  SpO2: 94% 95% 94% 97%  Weight:      Height:        Intake/Output Summary (Last 24 hours) at 11/20/2017 1540 Last data filed at 11/20/2017 0657 Gross per 24 hour  Intake -  Output 350 ml  Net -350 ml   Filed Weights   11/19/17 0416  Weight: 55.2 kg (121 lb 11.1 oz)    Examination:  General exam: Awake, does not really respond to my questions Respiratory system: Clear to auscultation. Respiratory effort normal. Cardiovascular system: Regular rate and rhythm, S1-S2 Gastrointestinal system: Soft, nontender, nondistended, positive bowel sounds Extremities: No clubbing or cyanosis or edema Psychiatry: Chronic dementia    Data Reviewed: I have personally reviewed following labs and imaging studies  CBC: Recent Labs  Lab 11/18/17 2237 11/19/17 0435  WBC 20.0* 16.1*  NEUTROABS 17.4* 13.9*  HGB 12.8* 11.3*  HCT 39.2 34.6*  MCV 94.7 94.3  PLT 301 284   Basic Metabolic Panel: Recent Labs  Lab 11/18/17 2237 11/19/17 0435 11/20/17 0447  NA 135 135 137  K 4.2 3.9 3.8  CL 100* 100* 102  CO2 25 24 24   GLUCOSE 121* 129* 123*  BUN 16 18 15   CREATININE 0.65 0.72 0.69  CALCIUM 9.1 8.7* 8.4*  MG 1.8  --   --   PHOS 3.0  --   --    GFR: Estimated Creatinine Clearance: 52.7 mL/min (by C-G formula based on SCr of 0.69 mg/dL). Liver Function Tests: Recent Labs  Lab 11/18/17 2237  AST 24  ALT  29  ALKPHOS 104  BILITOT 1.2  PROT 7.5  ALBUMIN 3.7   No results for input(s): LIPASE, AMYLASE in the last 168 hours. No results for input(s): AMMONIA in the last 168 hours. Coagulation Profile: Recent Labs  Lab 11/18/17 2237  INR 1.00   Cardiac Enzymes: No results for input(s): CKTOTAL, CKMB, CKMBINDEX, TROPONINI in the last 168 hours. BNP (last 3 results) No results for input(s): PROBNP in the last 8760 hours. HbA1C: No results for input(s): HGBA1C in the last 72 hours. CBG: No results for input(s): GLUCAP in the last 168  hours. Lipid Profile: No results for input(s): CHOL, HDL, LDLCALC, TRIG, CHOLHDL, LDLDIRECT in the last 72 hours. Thyroid Function Tests: No results for input(s): TSH, T4TOTAL, FREET4, T3FREE, THYROIDAB in the last 72 hours. Anemia Panel: No results for input(s): VITAMINB12, FOLATE, FERRITIN, TIBC, IRON, RETICCTPCT in the last 72 hours. Sepsis Labs: No results for input(s): PROCALCITON, LATICACIDVEN in the last 168 hours.  Recent Results (from the past 240 hour(s))  MRSA PCR Screening     Status: Abnormal   Collection Time: 11/19/17  4:14 AM  Result Value Ref Range Status   MRSA by PCR POSITIVE (A) NEGATIVE Final    Comment:        The GeneXpert MRSA Assay (FDA approved for NASAL specimens only), is one component of a comprehensive MRSA colonization surveillance program. It is not intended to diagnose MRSA infection nor to guide or monitor treatment for MRSA infections. RESULT CALLED TO, READ BACK BY AND VERIFIED WITH: ALSTON,C AT 1719 ON 4.11.2019 BY ISLEY,B Performed at [facility], [address] [place], [place] [address]          Radiology Studies: Dg Knee 2 Views Right  Result Date: 11/18/2017 CLINICAL DATA:  Knee pain after fall EXAM: RIGHT KNEE - 1-2 VIEW COMPARISON:  None. FINDINGS: No fracture or malalignment. Mild patellofemoral degenerative change. Moderate degenerative change of the lateral compartment with narrowing and spurring. No large effusion IMPRESSION: Mild to moderate arthritis.  No acute osseous abnormality. Electronically Signed   By: Jasmine PangKim  Fujinaga M.D.   On: 11/18/2017 20:19   Ct Head Wo Contrast  Result Date: 11/18/2017 CLINICAL DATA:  Larey SeatFell at nursing home today.  Dementia. EXAM: CT HEAD WITHOUT CONTRAST CT CERVICAL SPINE WITHOUT CONTRAST TECHNIQUE: Multidetector CT imaging of the head and cervical spine was performed following the standard protocol without intravenous contrast. Multiplanar CT image reconstructions of the cervical spine were also  generated. COMPARISON:  10/21/2014 FINDINGS: CT HEAD FINDINGS Brain: No evidence of acute infarction, hemorrhage, hydrocephalus, extra-axial collection or mass lesion/mass effect. There is ventricular and sulcal enlargement reflecting moderate to advanced atrophy. Periventricular white matter hypoattenuation is also noted consistent with mild chronic microvascular ischemic change. Vascular: No hyperdense vessel or unexpected calcification. Skull: Normal. Negative for fracture or focal lesion. Sinuses/Orbits: Globes and orbits are unremarkable. Visualized sinuses and mastoid air cells are clear. Other: None. CT CERVICAL SPINE FINDINGS Alignment: Normal. Skull base and vertebrae: No acute fracture. No primary bone lesion or focal pathologic process. There is some deformity of the C2 vertebra that suggests an old, healed fracture. Soft tissues and spinal canal: No prevertebral fluid or swelling. No visible canal hematoma. Disc levels: Mild disc and facet degenerative changes. Disc degenerative change greatest at C3-C4. No convincing disc herniation. Upper chest: No acute findings. No masses or adenopathy. Pleuroparenchymal scarring at the lung apices. Other: None. IMPRESSION: HEAD CT 1. No acute intracranial abnormalities. 2. Atrophy and chronic microvascular ischemic  change. 3. No skull fracture. CERVICAL CT 1. No fracture or acute finding Electronically Signed   By: Amie Portland M.D.   On: 11/18/2017 20:04   Ct Cervical Spine Wo Contrast  Result Date: 11/18/2017 CLINICAL DATA:  Larey Seat at nursing home today.  Dementia. EXAM: CT HEAD WITHOUT CONTRAST CT CERVICAL SPINE WITHOUT CONTRAST TECHNIQUE: Multidetector CT imaging of the head and cervical spine was performed following the standard protocol without intravenous contrast. Multiplanar CT image reconstructions of the cervical spine were also generated. COMPARISON:  10/21/2014 FINDINGS: CT HEAD FINDINGS Brain: No evidence of acute infarction, hemorrhage,  hydrocephalus, extra-axial collection or mass lesion/mass effect. There is ventricular and sulcal enlargement reflecting moderate to advanced atrophy. Periventricular white matter hypoattenuation is also noted consistent with mild chronic microvascular ischemic change. Vascular: No hyperdense vessel or unexpected calcification. Skull: Normal. Negative for fracture or focal lesion. Sinuses/Orbits: Globes and orbits are unremarkable. Visualized sinuses and mastoid air cells are clear. Other: None. CT CERVICAL SPINE FINDINGS Alignment: Normal. Skull base and vertebrae: No acute fracture. No primary bone lesion or focal pathologic process. There is some deformity of the C2 vertebra that suggests an old, healed fracture. Soft tissues and spinal canal: No prevertebral fluid or swelling. No visible canal hematoma. Disc levels: Mild disc and facet degenerative changes. Disc degenerative change greatest at C3-C4. No convincing disc herniation. Upper chest: No acute findings. No masses or adenopathy. Pleuroparenchymal scarring at the lung apices. Other: None. IMPRESSION: HEAD CT 1. No acute intracranial abnormalities. 2. Atrophy and chronic microvascular ischemic change. 3. No skull fracture. CERVICAL CT 1. No fracture or acute finding Electronically Signed   By: Amie Portland M.D.   On: 11/18/2017 20:04   Ct Hip Right Wo Contrast  Result Date: 11/18/2017 CLINICAL DATA:  Patient fell today. Unable to straighten legs. Right hip fracture. EXAM: CT OF THE RIGHT HIP WITHOUT CONTRAST TECHNIQUE: Multidetector CT imaging of the right hip was performed according to the standard protocol. Multiplanar CT image reconstructions were also generated. COMPARISON:  Same day radiographs of the right hip. FINDINGS: Bones/Joint/Cartilage Acute subcapital right femoral neck fracture with impaction of the neck on the head along its superolateral aspect is identified, series 6/36, series 2/42. Associated small joint effusion. No joint  dislocation. No acetabular or pubic rami fracture. The included sacrum and coccyx are intact. No pelvic diastasis. Ligaments Suboptimally assessed by CT. Muscles and Tendons No intramuscular hemorrhage or atrophy. Soft tissues Soft tissue edema compatible with soft tissue contusion overlying the right hip. No focal soft tissue hematoma. Moderate fecal retention within the included colon and rectum. IMPRESSION: 1. Acute, closed, subcapital right femoral neck fracture with impaction of the femoral neck on the femoral head along its superolateral aspect. Small joint effusion. No joint dislocation. 2. Increased stool burden within the included colon and rectum. Electronically Signed   By: Tollie Eth M.D.   On: 11/18/2017 21:39   Dg Chest Port 1 View  Result Date: 11/18/2017 CLINICAL DATA:  Hip fracture EXAM: PORTABLE CHEST 1 VIEW COMPARISON:  06/24/2016 FINDINGS: Low lung volumes. No acute consolidation or effusion. Cardiomediastinal silhouette within normal limits. Aortic atherosclerosis. No pneumothorax. IMPRESSION: No active disease. Electronically Signed   By: Jasmine Pang M.D.   On: 11/18/2017 22:35   Dg Hip Operative Unilat With Pelvis Right  Result Date: 11/19/2017 CLINICAL DATA:  Internal fixation right hip EXAM: OPERATIVE right HIP (WITH PELVIS IF PERFORMED) 7 VIEWS TECHNIQUE: Fluoroscopic spot image(s) were submitted for interpretation post-operatively. COMPARISON:  11/18/2017  FINDINGS: Seven low resolution intraoperative spot views of the right hip obtained during threaded screw fixation of right subcapital femur fracture. Anatomic alignment. Total fluoroscopy time was 63 seconds. IMPRESSION: Intraoperative fluoroscopic assistance provided during surgical fixation of right hip fracture Electronically Signed   By: Jasmine Pang M.D.   On: 11/19/2017 15:30   Dg Hip Unilat W Or Wo Pelvis 2-3 Views Right  Result Date: 11/18/2017 CLINICAL DATA:  Right leg pain after fall EXAM: DG HIP (WITH OR WITHOUT  PELVIS) 2-3V RIGHT COMPARISON:  None. FINDINGS: Possible step-off deformity at the right femoral head neck junction of the right hip. No dislocation. Pubic symphysis and rami appear intact. Both femoral heads project in joint. IMPRESSION: Possible subtle subcapital fracture involving the proximal right femur, CT recommended to further evaluate. Electronically Signed   By: Jasmine Pang M.D.   On: 11/18/2017 20:18        Scheduled Meds: . aspirin EC  325 mg Oral Q breakfast  . atorvastatin  40 mg Oral q morning - 10a  . docusate sodium  100 mg Oral BID  . pantoprazole  40 mg Oral Daily  . psyllium  1 packet Oral Daily   Continuous Infusions:    LOS: 2 days    Hollice Espy, MD [facility] Pager [phone]  If 7PM-7AM, please contact night-coverage [email or web address] Password [email or web address] 11/20/2017, 3:40 PM

## 2017-11-20 NOTE — Progress Notes (Signed)
Physical Therapy Treatment Patient Details Name: Chad Everett MRN: [id number] DOB: 20-Feb-1931 Today's Date: 11/20/2017    History of Present Illness Chad Everett is a 84 y.o. male s/p Right hip ORIF 11/19/17 secondary to hip fracture, with medical history significant of anxiety, asthma, dementia, GERD, hyperlipidemia, hypertension, history of pneumonia who is brought from [facility] after having a fall.  No further history is available at this time on the patient    PT Comments    Patient presents in PM still in chair, his guardian in room, more alert and agreeable for therapy.  Patient demonstrates slow labored movement for sit to stands, able to lock knees enough to stand with RW and take a couple of steps to transfer to bedside with Max assist.  Patient required Max assist to reposition in bed and pillows placed to help prevent patient from lying in fetal position - RN informed that condom catheter need to be put back on patient.  Patient will benefit from continued physical therapy in hospital and recommended venue below to increase strength, balance, endurance for safe ADLs and gait.   Follow Up Recommendations  SNF     Equipment Recommendations  Rolling walker with 5" wheels    Recommendations for Other Services       Precautions / Restrictions Precautions Precautions: Fall Restrictions Weight Bearing Restrictions: No RLE Weight Bearing: Weight bearing as tolerated    Mobility  Bed Mobility Overal bed mobility: Needs Assistance Bed Mobility: Sit to Supine Rolling: Max assist   Supine to sit: Max assist Sit to supine: Max assist      Transfers Overall transfer level: Needs assistance Equipment used: Rolling walker (2 wheeled) Transfers: Sit to/from [employer] Sit to Stand: Max assist Stand pivot transfers: Max assist       General transfer comment: Patient demonstrates improvement for balancing with RW in PM and able to use for transfer back to  bed  Ambulation/Gait Ambulation/Gait assistance: Max assist Ambulation Distance (Feet): 2 Feet Assistive device: Rolling walker (2 wheeled) Gait Pattern/deviations: Decreased step length - right;Decreased stance time - right;Decreased step length - left;Decreased stride length Gait velocity: decreased   General Gait Details: limited to 2-3 side steps using RW due to c/o severe pain with weightbearing on RLE   Stairs             Wheelchair Mobility    Modified Rankin (Stroke Patients Only)       Balance Overall balance assessment: Needs assistance Sitting-balance support: Feet supported;Bilateral upper extremity supported Sitting balance-Leahy Scale: Poor Sitting balance - Comments: fair/poor    Standing balance support: Bilateral upper extremity supported;During functional activity Standing balance-Leahy Scale: Poor Standing balance comment: using RW                            Cognition Arousal/Alertness: Awake/alert Behavior During Therapy: WFL for tasks assessed/performed Overall Cognitive Status: Within Functional Limits for tasks assessed                                 General Comments: patient more responsive and appropriate      Exercises      General Comments        Pertinent Vitals/Pain Pain Assessment: Faces Pain Score: 8  Faces Pain Scale: Hurts whole lot Pain Location: right hip Pain Descriptors / Indicators: Grimacing;Guarding;[facility]    Home Living  Prior Function            PT Goals (current goals can now be found in the care plan section) Acute Rehab PT Goals Patient Stated Goal: return home PT Goal Formulation: With patient/family(patient's guardian) Time For Goal Achievement: 12/04/17 Potential to Achieve Goals: Good Progress towards PT goals: Progressing toward goals    Frequency    Min 5X/week      PT Plan Current plan remains appropriate    Co-evaluation               AM-PAC PT "6 Clicks" Daily Activity  Outcome Measure  Difficulty turning over in bed (including adjusting bedclothes, sheets and blankets)?: A Lot Difficulty moving from lying on back to sitting on the side of the bed? : A Lot Difficulty sitting down on and standing up from a chair with arms (e.g., wheelchair, bedside commode, etc,.)?: A Lot Help needed moving to and from a bed to chair (including a wheelchair)?: A Lot Help needed walking in hospital room?: Total Help needed climbing 3-5 steps with a railing? : Total 6 Click Score: 10    End of Session   Activity Tolerance: Patient limited by fatigue;Patient limited by pain Patient left: in bed;with call bell/phone within reach;with bed alarm set Nurse Communication: Mobility status;Other (comment)(RN notified that patient put back to bed and need condom catheter put back on) PT Visit Diagnosis: Unsteadiness on feet (R26.81);Other abnormalities of gait and mobility (R26.89);Muscle weakness (generalized) (M62.81)     Time: [id number] PT Time Calculation (min) (ACUTE ONLY): 22 min  Charges:  $Therapeutic Activity: 8-22 mins                    G Codes:       3:14 PM, 11/20/17 Chad Everett, MPT Physical Therapist with [facility] 336 [phone] office [phone] mobile phone

## 2017-11-20 NOTE — Plan of Care (Signed)
  Problem: Acute Rehab PT Goals(only PT should resolve) Goal: Pt Will Go Supine/Side To Sit Outcome: Progressing Flowsheets (Taken 11/20/2017 1119) Pt will go Supine/Side to Sit: with moderate assist Goal: Patient Will Transfer Sit To/From Stand Outcome: Progressing Flowsheets (Taken 11/20/2017 1119) Patient will transfer sit to/from stand: with moderate assist Goal: Pt Will Transfer Bed To Chair/Chair To Bed Outcome: Progressing Flowsheets (Taken 11/20/2017 1119) Pt will Transfer Bed to Chair/Chair to Bed: with mod assist Goal: Pt Will Ambulate Outcome: Progressing Flowsheets (Taken 11/20/2017 1119) Pt will Ambulate: 10 feet;with maximum assist;with rolling walker  11:20 AM, 11/20/17 Ocie BobJames Ras Kollman, MPT Physical Therapist with [facility] 336 [phone] office [phone] mobile phone

## 2017-11-20 NOTE — Evaluation (Addendum)
Physical Therapy Evaluation Patient Details Name: Chad Everett MRN: [id number] DOB: 23-Mar-1931 Today's Date: 11/20/2017   History of Present Illness  Chad Everett is a 84 y.o. male s/p Right hip ORIF 11/19/17 secondary to hip fracture, with medical history significant of anxiety, asthma, dementia, GERD, hyperlipidemia, hypertension, history of pneumonia who is brought from [facility] after having a fall.  No further history is available at this time on the patient    Clinical Impression  Patient presents in bed in lying on left side in fetal position.  Patient c/o severe pain with movement to RLE despite pain medication prior to treatment, tolerated standing for approximately 10-20 seconds, but unable to lock knees or transfer to chair using RW due to BLE weakness, RLE pain, required stand pivot with knees blocked to transfer to chair and tolerated staying up with legs elevated - nursing staff aware.  Patient will benefit from continued physical therapy in hospital and recommended venue below to increase strength, balance, endurance for safe ADLs and gait.    Follow Up Recommendations SNF    Equipment Recommendations  None recommended by PT    Recommendations for Other Services       Precautions / Restrictions Precautions Precautions: Fall Restrictions Weight Bearing Restrictions: Yes RLE Weight Bearing: Weight bearing as tolerated      Mobility  Bed Mobility Overal bed mobility: Needs Assistance Bed Mobility: Supine to Sit;Sit to Supine;Rolling Rolling: Max assist   Supine to sit: Max assist Sit to supine: Max assist      Transfers Overall transfer level: Needs assistance Equipment used: Rolling walker (2 wheeled) Transfers: Sit to/from [employer] Sit to Stand: Max assist Stand pivot transfers: Max assist       General transfer comment: unable to transfer using RW due to inability to take steps secondary to weakness, RLE pain  Ambulation/Gait                 Stairs            Wheelchair Mobility    Modified Rankin (Stroke Patients Only)       Balance Overall balance assessment: Needs assistance Sitting-balance support: Feet supported;No upper extremity supported Sitting balance-Leahy Scale: Poor Sitting balance - Comments: fair/poor    Standing balance support: Bilateral upper extremity supported;During functional activity Standing balance-Leahy Scale: Poor Standing balance comment: using RW, unable to lock knees due to weakness                             Pertinent Vitals/Pain Pain Assessment: Faces Faces Pain Scale: Hurts whole lot Pain Location: right hip Pain Descriptors / Indicators: Grimacing;Guarding;Sharp Pain Intervention(s): Limited activity within patient's tolerance;Monitored during session;Premedicated before session    Home Living Family/patient expects to be discharged to:: Assisted living                 Additional Comments: patient is poor historian    Prior Function                 Hand Dominance        Extremity/Trunk Assessment   Upper Extremity Assessment Upper Extremity Assessment: Overall WFL for tasks assessed    Lower Extremity Assessment Lower Extremity Assessment: Generalized weakness;RLE deficits/detail;LLE deficits/detail RLE Deficits / Details: grossly 2+/5 LLE Deficits / Details: grossly 3+/5    Cervical / Trunk Assessment Cervical / Trunk Assessment: Kyphotic  Communication   Communication: HOH(patient mostly non-verbal )  Cognition  Arousal/Alertness: Awake/alert Behavior During Therapy: Flat affect Overall Cognitive Status: No family/caregiver present to determine baseline cognitive functioning                                 General Comments: patient apprehensive to get up due to pain with movement      General Comments      Exercises     Assessment/Plan    PT Assessment Patient needs continued PT services   PT Problem List Decreased strength;Decreased activity tolerance;Decreased balance;Decreased mobility;Decreased range of motion       PT Treatment Interventions Gait training;Functional mobility training;Therapeutic activities;Therapeutic exercise;Patient/family education    PT Goals (Current goals can be found in the Care Plan section)  Acute Rehab PT Goals Patient Stated Goal: not stated PT Goal Formulation: With patient Time For Goal Achievement: 12/04/17 Potential to Achieve Goals: Fair    Frequency Min 5X/week   Barriers to discharge        Co-evaluation               AM-PAC PT "6 Clicks" Daily Activity  Outcome Measure Difficulty turning over in bed (including adjusting bedclothes, sheets and blankets)?: A Lot Difficulty moving from lying on back to sitting on the side of the bed? : A Lot Difficulty sitting down on and standing up from a chair with arms (e.g., wheelchair, bedside commode, etc,.)?: A Lot Help needed moving to and from a bed to chair (including a wheelchair)?: A Lot Help needed walking in hospital room?: Total Help needed climbing 3-5 steps with a railing? : Total 6 Click Score: 10    End of Session   Activity Tolerance: Patient limited by fatigue;Patient limited by pain Patient left: in chair;with call bell/phone within reach;with chair alarm set Nurse Communication: Mobility status;Other (comment)(RN notified patient left up in chair) PT Visit Diagnosis: Unsteadiness on feet (R26.81);Other abnormalities of gait and mobility (R26.89);Muscle weakness (generalized) (M62.81)    Time: [id number] PT Time Calculation (min) (ACUTE ONLY): 28 min   Charges:   PT Evaluation $PT Eval Moderate Complexity: 1 Mod PT Treatments $Therapeutic Activity: 23-37 mins   PT G Codes:        11:16 AM, 11/20/17 Ocie BobJames Quavion Boule, MPT Physical Therapist [facility] [phone] office [phone] mobile phone

## 2017-11-21 DIAGNOSIS — S72001A Fracture of unspecified part of neck of right femur, initial encounter for closed fracture: Secondary | ICD-10-CM

## 2017-11-21 DIAGNOSIS — E440 Moderate protein-calorie malnutrition: Secondary | ICD-10-CM

## 2017-11-21 DIAGNOSIS — L8990 Pressure ulcer of unspecified site, unspecified stage: Secondary | ICD-10-CM

## 2017-11-21 DIAGNOSIS — J449 Chronic obstructive pulmonary disease, unspecified: Secondary | ICD-10-CM

## 2017-11-21 LAB — CBC
HCT: 35.3 % — ABNORMAL LOW (ref 39.0–52.0)
Hemoglobin: 11.2 g/dL — ABNORMAL LOW (ref 13.0–17.0)
MCH: 30.1 pg (ref 26.0–34.0)
MCHC: 31.7 g/dL (ref 30.0–36.0)
MCV: 94.9 fL (ref 78.0–100.0)
PLATELETS: 226 10*3/uL (ref 150–400)
RBC: 3.72 MIL/uL — ABNORMAL LOW (ref 4.22–5.81)
RDW: 14.4 % (ref 11.5–15.5)
WBC: 12.1 10*3/uL — ABNORMAL HIGH (ref 4.0–10.5)

## 2017-11-21 LAB — BASIC METABOLIC PANEL
Anion gap: 11 (ref 5–15)
BUN: 15 mg/dL (ref 6–20)
CALCIUM: 8.5 mg/dL — AB (ref 8.9–10.3)
CO2: 25 mmol/L (ref 22–32)
CREATININE: 0.55 mg/dL — AB (ref 0.61–1.24)
Chloride: 101 mmol/L (ref 101–111)
GFR calc Af Amer: >60 mL/min (ref >60)
GFR calc non Af Amer: >60 mL/min (ref >60)
GLUCOSE: 113 mg/dL — AB (ref 65–99)
Potassium: 3.8 mmol/L (ref 3.5–5.1)
Sodium: 137 mmol/L (ref 135–145)

## 2017-11-21 MED ORDER — TRAMADOL HCL 50 MG PO TABS: 50.0000 mg | ORAL_TABLET | Freq: Four times a day (QID) | ORAL | 0 refills | Status: AC | PRN

## 2017-11-21 MED ORDER — ASPIRIN 325 MG PO TBEC: 325.0000 mg | DELAYED_RELEASE_TABLET | Freq: Every day | ORAL | 0 refills | Status: DC

## 2017-11-21 NOTE — Progress Notes (Signed)
[facility] [place] and in stable condition. Patient's IV and telemetry removed, WNL. [place] to [name] [place], LPN. Patient will be transported via EMS, awaiting arrival.  Quita SkyeMorgan P Dishmon, RN

## 2017-11-21 NOTE — Progress Notes (Signed)
Patient will Discharge ZO:XWRUETo:[name] Anticipated DC Date:11/21/17 Family Notified: [place] to [facility] to leave message.  Not able to leave message for discharging patient.  Left message for Einar CrowDavid Flippin, son [phone] [facility]: [facility]   Per MD [facility] . RN, patient, patient's family, and facility notified of DC. Assessment, Fl2/Pasrr, and Discharge Summary sent to facility. RN given number for report [phone]). DC packet on chart. Ambulance transport requested for patient.   CSW signing off.  Budd Palmerara Edye Hainline LCSWA [phone]

## 2017-11-21 NOTE — Progress Notes (Signed)
Physical Therapy Treatment Patient Details Name: Bexley Mclester MRN: [id number] DOB: [birth year] Today's Date: 11/21/2017    History of Present Illness Jedrek Dinovo is a 84 y.o. male s/p Right hip ORIF 11/19/17 secondary to hip fracture, with medical history significant of anxiety, asthma, dementia, GERD, hyperlipidemia, hypertension, history of pneumonia who is brought from [facility] after having a fall.  No further history is available at this time on the patient    PT Comments    Patient alert, apprehensive due to right hip pain with movement and cooperative with therapy after encouragement.  Patient unable to extend knees when attempting sitting up, required him roll to side and sit up from side lying position, able to stand and take a few tiny side steps, but unable to lock knees with near loss of balance before transferring to chair.  Patient tolerated sitting up in chair - RN aware.  Patient will benefit from continued physical therapy in hospital and recommended venue below to increase strength, balance, endurance for safe ADLs and gait.    Follow Up Recommendations  SNF     Equipment Recommendations  Rolling walker with 5" wheels    Recommendations for Other Services       Precautions / Restrictions Precautions Precautions: Fall Restrictions Weight Bearing Restrictions: No RLE Weight Bearing: Weight bearing as tolerated    Mobility  Bed Mobility Overal bed mobility: Needs Assistance Bed Mobility: Supine to Sit;Sit to Supine Rolling: Mod assist;Max assist Sidelying to sit: Max assist   Sit to supine: Max assist   General bed mobility comments: patient able to use BUE to hold onto bed rail when rolling to side, poor return for extending knees when sitting up at bedside  Transfers Overall transfer level: Needs assistance Equipment used: Rolling walker (2 wheeled) Transfers: Sit to/from [employer] Sit to Stand: Max assist Stand pivot transfers: Max  assist       General transfer comment: has difficulty locking knees due to weakness and right hip pain  Ambulation/Gait Ambulation/Gait assistance: Max assist Ambulation Distance (Feet): 2 Feet Assistive device: Rolling walker (2 wheeled) Gait Pattern/deviations: Decreased step length - right;Decreased step length - left;Decreased stance time - right;Decreased stride length Gait velocity: decreased   General Gait Details: limited to 3-4 short side steps using RW due to c/o severe pain with weightbearing on RLE   Stairs             Wheelchair Mobility    Modified Rankin (Stroke Patients Only)       Balance Overall balance assessment: Needs assistance Sitting-balance support: Feet supported;Bilateral upper extremity supported Sitting balance-Leahy Scale: Fair     Standing balance support: Bilateral upper extremity supported;During functional activity Standing balance-Leahy Scale: Poor Standing balance comment: using RW                            Cognition Arousal/Alertness: Awake/alert Behavior During Therapy: WFL for tasks assessed/performed Overall Cognitive Status: Within Functional Limits for tasks assessed                                 General Comments: patient very apprehensive      Exercises      General Comments        Pertinent Vitals/Pain Pain Assessment: Faces Faces Pain Scale: Hurts even more Pain Location: right hip with movement Pain Descriptors / Indicators: Grimacing;Guarding;Sharp Pain Intervention(s):  Limited activity within patient's tolerance;Monitored during session    Home Living                      Prior Function            PT Goals (current goals can now be found in the care plan section) Acute Rehab PT Goals Patient Stated Goal: return home PT Goal Formulation: With patient Time For Goal Achievement: 12/04/17 Potential to Achieve Goals: Good Progress towards PT goals: Progressing  toward goals    Frequency    Min 5X/week      PT Plan Current plan remains appropriate    Co-evaluation              AM-PAC PT "6 Clicks" Daily Activity  Outcome Measure  Difficulty turning over in bed (including adjusting bedclothes, sheets and blankets)?: A Lot Difficulty moving from lying on back to sitting on the side of the bed? : A Lot Difficulty sitting down on and standing up from a chair with arms (e.g., wheelchair, bedside commode, etc,.)?: A Lot Help needed moving to and from a bed to chair (including a wheelchair)?: A Lot   Help needed climbing 3-5 steps with a railing? : Total 6 Click Score: 9    End of Session Equipment Utilized During Treatment: Gait belt Activity Tolerance: Patient limited by fatigue;Patient limited by pain Patient left: in chair;with call bell/phone within reach;with chair alarm set Nurse Communication: Mobility status(RN aware patient left up in chair) PT Visit Diagnosis: Unsteadiness on feet (R26.81);Other abnormalities of gait and mobility (R26.89);Muscle weakness (generalized) (M62.81)     Time: [id number] PT Time Calculation (min) (ACUTE ONLY): 31 min  Charges:  $Therapeutic Activity: 23-37 mins                    G Codes:       11:09 AM, 11/21/17 Ocie BobJames Jabin Tapp, MPT Physical Therapist [facility] [phone] office [phone] mobile phone

## 2017-11-21 NOTE — Discharge Summary (Signed)
Physician Discharge Summary  Chad [facility]rett ZOX:[id number] DOB: May 01, 1931 DOA: 11/18/2017  PCP: [facility]  Admit date: 11/18/2017 Discharge date: 11/21/2017  Time spent: >35 minutes  Recommendations for Outpatient Follow-up:  MD at SNF Orthopedics in 2 week   Discharge Diagnoses:  Principal Problem:   Closed right hip fracture, initial encounter [facility]) Active Problems:   Dementia   GERD (gastroesophageal reflux disease)   Anxiety   COPD (chronic obstructive pulmonary disease) (HCC)   Hyperlipidemia   Malnutrition of moderate degree   Pressure injury of skin   Discharge Condition: stable   Diet recommendation: Dysphagia 3 (Mech soft);Thin liquid   Filed Weights   11/19/17 0416  Weight: 55.2 kg (121 lb 11.1 oz)    History of present illness:   84 year old male with a history of dementia who is a resident of an assisted living facility, was [facility] after suffering a [facility].  He was found to have a right-sided hip fracture and underwent operative management by orthopedics on 4/11.  Evaluated by physical therapy and will need skilled nursing.  Guardian notified & agreed.  She signed approval forms at [facility].    [facility] Course:   Closed right hip fracture, subsequent encounter [facility]): Stable. SNF when bed is available. Recommend to f/u with orthopedics in 2 weeks. On aspirin prophylaxis per orthopedics   Active Problems:   Dementia without behavioral disturbance: At baseline.  No family, ward of the state   GERD (gastroesophageal reflux disease): Stable continue PPI   Anxiety   COPD (chronic obstructive pulmonary disease) (HCC): Stable, breathing comfortably   Hyperlipidemia   Malnutrition of moderate degree: Patient meets criteria as per reports from facility, percent weight loss and mild to moderate muscle and fat loss.  Followed by nutrition. Leukocytosis: Felt to be secondary to stress margination: Trending downward.  afebrile     Procedures:  Hip surgery  (i.e. Studies not automatically included, echos, thoracentesis, etc; not x-rays)  Consultations:  Orthopedics   Discharge Exam: Vitals:   11/21/17 0220 11/21/17 0636  BP: 136/60 (!) 149/71  Pulse: 78 72  Resp: 14   Temp: 98.4 F (36.9 C) 98.2 F (36.8 C)  SpO2: 97% 97%    General: no distress  Cardiovascular: s1,s2 rrr Respiratory: CTA BL  Discharge Instructions  Discharge Instructions    Diet - low sodium heart healthy   Complete by:  As directed    Discharge instructions   Complete by:  As directed    Follow up with orthopedics in 2 weeks   Increase activity slowly   Complete by:  As directed      Allergies as of 11/21/2017      Reactions   Onion Other (See Comments)   Indigestion      Medication List    TAKE these medications   acetaminophen 325 MG tablet Commonly known as:  TYLENOL Take 650 mg by mouth every 4 (four) hours as needed for moderate pain.   aspirin 325 MG EC tablet Take 1 tablet (325 mg total) by mouth daily with breakfast. Start taking on:  11/22/2017 What changed:    medication strength  how much to take  when to take this   atorvastatin 40 MG tablet Commonly known as:  LIPITOR Take 40 mg by mouth every morning.   Co Q-10 200 MG Caps Take 1 capsule by mouth daily.   guaifenesin 100 MG/5ML syrup Commonly known as:  ROBITUSSIN Take 200 mg by mouth every  6 (six) hours as needed for cough.   loperamide 2 MG capsule Commonly known as:  IMODIUM Take 2 mg by mouth as needed for diarrhea or loose stools.   MINTOX 200-200-20 MG/5ML suspension Generic drug:  alum & mag hydroxide-simeth Take 30 mLs by mouth daily as needed for indigestion or heartburn.   omeprazole 20 MG capsule Commonly known as:  PRILOSEC Take 40 mg by mouth daily.   psyllium 0.52 g capsule Commonly known as:  REGULOID Take 0.52 g by mouth daily.   traMADol 50 MG tablet Commonly known as:  ULTRAM Take 1 tablet (50 mg  total) by mouth every 6 (six) hours as needed for up to 3 days for severe pain.   TRIPLE ANTIBIOTIC [phone] Oint Apply 1 application topically as needed (for minor skin tears or abrasions).      Allergies  Allergen Reactions  . Onion Other (See Comments)    Indigestion   Contact information for after-discharge care    Destination    [name] .   Service:  Skilled Nursing Contact information: [address] [facility] [id number] [phone]               The results of significant diagnostics from this hospitalization (including imaging, microbiology, ancillary and laboratory) are listed below for reference.    Significant Diagnostic Studies: Dg Knee 2 Views Right  Result Date: 11/18/2017 CLINICAL DATA:  Knee pain after fall EXAM: RIGHT KNEE - 1-2 VIEW COMPARISON:  None. FINDINGS: No fracture or malalignment. Mild patellofemoral degenerative change. Moderate degenerative change of the lateral compartment with narrowing and spurring. No large effusion IMPRESSION: Mild to moderate arthritis.  No acute osseous abnormality. Electronically Signed   By: Jasmine PangKim  Fujinaga M.D.   On: 11/18/2017 20:19   Ct Head Wo Contrast  Result Date: 11/18/2017 CLINICAL DATA:  Larey SeatFell at nursing home today.  Dementia. EXAM: CT HEAD WITHOUT CONTRAST CT CERVICAL SPINE WITHOUT CONTRAST TECHNIQUE: Multidetector CT imaging of the head and cervical spine was performed following the standard protocol without intravenous contrast. Multiplanar CT image reconstructions of the cervical spine were also generated. COMPARISON:  10/21/2014 FINDINGS: CT HEAD FINDINGS Brain: No evidence of acute infarction, hemorrhage, hydrocephalus, extra-axial collection or mass lesion/mass effect. There is ventricular and sulcal enlargement reflecting moderate to advanced atrophy. Periventricular white matter hypoattenuation is also noted consistent with mild chronic microvascular ischemic change.  Vascular: No hyperdense vessel or unexpected calcification. Skull: Normal. Negative for fracture or focal lesion. Sinuses/Orbits: Globes and orbits are unremarkable. Visualized sinuses and mastoid air cells are clear. Other: None. CT CERVICAL SPINE FINDINGS Alignment: Normal. Skull base and vertebrae: No acute fracture. No primary bone lesion or focal pathologic process. There is some deformity of the C2 vertebra that suggests an old, healed fracture. Soft tissues and spinal canal: No prevertebral fluid or swelling. No visible canal hematoma. Disc levels: Mild disc and facet degenerative changes. Disc degenerative change greatest at C3-C4. No convincing disc herniation. Upper chest: No acute findings. No masses or adenopathy. Pleuroparenchymal scarring at the lung apices. Other: None. IMPRESSION: HEAD CT 1. No acute intracranial abnormalities. 2. Atrophy and chronic microvascular ischemic change. 3. No skull fracture. CERVICAL CT 1. No fracture or acute finding Electronically Signed   By: Amie Portlandavid  Ormond M.D.   On: 11/18/2017 20:04   Ct Cervical Spine Wo Contrast  Result Date: 11/18/2017 CLINICAL DATA:  Larey SeatFell at nursing home today.  Dementia. EXAM: CT HEAD WITHOUT CONTRAST CT CERVICAL SPINE WITHOUT CONTRAST  TECHNIQUE: Multidetector CT imaging of the head and cervical spine was performed following the standard protocol without intravenous contrast. Multiplanar CT image reconstructions of the cervical spine were also generated. COMPARISON:  10/21/2014 FINDINGS: CT HEAD FINDINGS Brain: No evidence of acute infarction, hemorrhage, hydrocephalus, extra-axial collection or mass lesion/mass effect. There is ventricular and sulcal enlargement reflecting moderate to advanced atrophy. Periventricular white matter hypoattenuation is also noted consistent with mild chronic microvascular ischemic change. Vascular: No hyperdense vessel or unexpected calcification. Skull: Normal. Negative for fracture or focal lesion.  Sinuses/Orbits: Globes and orbits are unremarkable. Visualized sinuses and mastoid air cells are clear. Other: None. CT CERVICAL SPINE FINDINGS Alignment: Normal. Skull base and vertebrae: No acute fracture. No primary bone lesion or focal pathologic process. There is some deformity of the C2 vertebra that suggests an old, healed fracture. Soft tissues and spinal canal: No prevertebral fluid or swelling. No visible canal hematoma. Disc levels: Mild disc and facet degenerative changes. Disc degenerative change greatest at C3-C4. No convincing disc herniation. Upper chest: No acute findings. No masses or adenopathy. Pleuroparenchymal scarring at the lung apices. Other: None. IMPRESSION: HEAD CT 1. No acute intracranial abnormalities. 2. Atrophy and chronic microvascular ischemic change. 3. No skull fracture. CERVICAL CT 1. No fracture or acute finding Electronically Signed   By: Amie Portland M.D.   On: 11/18/2017 20:04   Ct Hip Right Wo Contrast  Result Date: 11/18/2017 CLINICAL DATA:  Patient fell today. Unable to straighten legs. Right hip fracture. EXAM: CT OF THE RIGHT HIP WITHOUT CONTRAST TECHNIQUE: Multidetector CT imaging of the right hip was performed according to the standard protocol. Multiplanar CT image reconstructions were also generated. COMPARISON:  Same day radiographs of the right hip. FINDINGS: Bones/Joint/Cartilage Acute subcapital right femoral neck fracture with impaction of the neck on the head along its superolateral aspect is identified, series 6/36, series 2/42. Associated small joint effusion. No joint dislocation. No acetabular or pubic rami fracture. The included sacrum and coccyx are intact. No pelvic diastasis. Ligaments Suboptimally assessed by CT. Muscles and Tendons No intramuscular hemorrhage or atrophy. Soft tissues Soft tissue edema compatible with soft tissue contusion overlying the right hip. No focal soft tissue hematoma. Moderate fecal retention within the included colon  and rectum. IMPRESSION: 1. Acute, closed, subcapital right femoral neck fracture with impaction of the femoral neck on the femoral head along its superolateral aspect. Small joint effusion. No joint dislocation. 2. Increased stool burden within the included colon and rectum. Electronically Signed   By: Tollie Eth M.D.   On: 11/18/2017 21:39   Dg Chest Port 1 View  Result Date: 11/18/2017 CLINICAL DATA:  Hip fracture EXAM: PORTABLE CHEST 1 VIEW COMPARISON:  06/24/2016 FINDINGS: Low lung volumes. No acute consolidation or effusion. Cardiomediastinal silhouette within normal limits. Aortic atherosclerosis. No pneumothorax. IMPRESSION: No active disease. Electronically Signed   By: Jasmine Pang M.D.   On: 11/18/2017 22:35   Dg Hip Operative Unilat With Pelvis Right  Result Date: 11/19/2017 CLINICAL DATA:  Internal fixation right hip EXAM: OPERATIVE right HIP (WITH PELVIS IF PERFORMED) 7 VIEWS TECHNIQUE: Fluoroscopic spot image(s) were submitted for interpretation post-operatively. COMPARISON:  11/18/2017 FINDINGS: Seven low resolution intraoperative spot views of the right hip obtained during threaded screw fixation of right subcapital femur fracture. Anatomic alignment. Total fluoroscopy time was 63 seconds. IMPRESSION: Intraoperative fluoroscopic assistance provided during surgical fixation of right hip fracture Electronically Signed   By: Jasmine Pang M.D.   On: 11/19/2017 15:30   Dg  Hip Unilat W Or Wo Pelvis 2-3 Views Right  Result Date: 11/18/2017 CLINICAL DATA:  Right leg pain after fall EXAM: DG HIP (WITH OR WITHOUT PELVIS) 2-3V RIGHT COMPARISON:  None. FINDINGS: Possible step-off deformity at the right femoral head neck junction of the right hip. No dislocation. Pubic symphysis and rami appear intact. Both femoral heads project in joint. IMPRESSION: Possible subtle subcapital fracture involving the proximal right femur, CT recommended to further evaluate. Electronically Signed   By: Jasmine Pang  M.D.   On: 11/18/2017 20:18    Microbiology: Recent Results (from the past 240 hour(s))  MRSA PCR Screening     Status: Abnormal   Collection Time: 11/19/17  4:14 AM  Result Value Ref Range Status   MRSA by PCR POSITIVE (A) NEGATIVE Final    Comment:        The GeneXpert MRSA Assay (FDA approved for NASAL specimens only), is one component of a comprehensive MRSA colonization surveillance program. It is not intended to diagnose MRSA infection nor to guide or monitor treatment for MRSA infections. RESULT CALLED TO, READ BACK BY AND VERIFIED WITH: ALSTON,C AT 1719 ON 4.11.2019 BY ISLEY,B Performed at [facility], [address] [place], [place] [address]      Labs: Basic Metabolic Panel: Recent Labs  Lab 11/18/17 2237 11/19/17 0435 11/20/17 0447 11/21/17 0644  NA 135 135 137 137  K 4.2 3.9 3.8 3.8  CL 100* 100* 102 101  CO2 25 24 24 25   GLUCOSE 121* 129* 123* 113*  BUN 16 18 15 15   CREATININE 0.65 0.72 0.69 0.55*  CALCIUM 9.1 8.7* 8.4* 8.5*  MG 1.8  --   --   --   PHOS 3.0  --   --   --    Liver Function Tests: Recent Labs  Lab 11/18/17 2237  AST 24  ALT 29  ALKPHOS 104  BILITOT 1.2  PROT 7.5  ALBUMIN 3.7   No results for input(s): LIPASE, AMYLASE in the last 168 hours. No results for input(s): AMMONIA in the last 168 hours. CBC: Recent Labs  Lab 11/18/17 2237 11/19/17 0435 11/20/17 0447 11/21/17 0644  WBC 20.0* 16.1* 13.9* 12.1*  NEUTROABS 17.4* 13.9*  --   --   HGB 12.8* 11.3* 11.3* 11.2*  HCT 39.2 34.6* 35.9* 35.3*  MCV 94.7 94.3 97.3 94.9  PLT 301 284 252 226   Cardiac Enzymes: No results for input(s): CKTOTAL, CKMB, CKMBINDEX, TROPONINI in the last 168 hours. BNP: BNP (last 3 results) No results for input(s): BNP in the last 8760 hours.  ProBNP (last 3 results) No results for input(s): PROBNP in the last 8760 hours.  CBG: No results for input(s): GLUCAP in the last 168 hours.     SignedEsperanza Sheets  [facility] 11/21/2017, 11:18 AM

## 2017-11-22 LAB — BPAM RBC
Blood Product Expiration Date: 201904252359
Blood Product Expiration Date: 201904292359
UNIT TYPE AND RH: 6200
Unit Type and Rh: 6200

## 2017-11-22 LAB — TYPE AND SCREEN
ABO/RH(D): A POS
Antibody Screen: NEGATIVE
UNIT DIVISION: 0
Unit division: 0

## 2017-12-03 ENCOUNTER — Ambulatory Visit (INDEPENDENT_AMBULATORY_CARE_PROVIDER_SITE_OTHER): Payer: Self-pay | Admitting: Orthopedic Surgery

## 2017-12-03 DIAGNOSIS — Z8781 Personal history of (healed) traumatic fracture: Secondary | ICD-10-CM | POA: Insufficient documentation

## 2017-12-03 DIAGNOSIS — Z9889 Other specified postprocedural states: Secondary | ICD-10-CM

## 2017-12-03 DIAGNOSIS — Z967 Presence of other bone and tendon implants: Secondary | ICD-10-CM

## 2017-12-03 NOTE — Progress Notes (Signed)
Patient presents for follow up 14 days after his cannulated screw placement for right hip fracture. Staples removed without difficulty. Incision well healed, no redness or drainage noted. Patient will follow up next week to see Dr Romeo AppleHarrison for xray and office visit.

## 2017-12-08 ENCOUNTER — Ambulatory Visit (INDEPENDENT_AMBULATORY_CARE_PROVIDER_SITE_OTHER): Payer: No Typology Code available for payment source

## 2017-12-08 ENCOUNTER — Ambulatory Visit (INDEPENDENT_AMBULATORY_CARE_PROVIDER_SITE_OTHER): Payer: Self-pay | Admitting: Orthopedic Surgery

## 2017-12-08 VITALS — BP 121/67 | HR 71

## 2017-12-08 DIAGNOSIS — Z8781 Personal history of (healed) traumatic fracture: Secondary | ICD-10-CM

## 2017-12-08 DIAGNOSIS — Z967 Presence of other bone and tendon implants: Secondary | ICD-10-CM | POA: Diagnosis not present

## 2017-12-08 DIAGNOSIS — Z9889 Other specified postprocedural states: Secondary | ICD-10-CM

## 2017-12-08 NOTE — Progress Notes (Signed)
POSTOP VISIT  POD # 19  BP 121/67   Pulse 71   Encounter Diagnosis  Name Primary?  . S/P ORIF (open reduction internal fixation) fracture right hip cannulated screw placement 11/19/17 Yes   [place]ard of the state social worker with him  Appropriate sliding of the fracture on the screws.  Further x-rays will be taken in 8 weeks  Hip flexion normal without pain  X-rays show impaction of the fracture no head penetration of the screws wbat  xrays in 8 weeks

## 2017-12-08 NOTE — Patient Instructions (Signed)
Full weight bearing °

## 2018-01-27 ENCOUNTER — Other Ambulatory Visit: Payer: Self-pay

## 2018-01-27 ENCOUNTER — Inpatient Hospital Stay (HOSPITAL_COMMUNITY)
Admission: EM | Admit: 2018-01-27 | Discharge: 2018-02-01 | DRG: 871 | Disposition: A | Discharge status: Skilled Nursing Facility | Payer: Medicare Other | Source: Skilled Nursing Facility | Attending: Internal Medicine | Admitting: Internal Medicine

## 2018-01-27 ENCOUNTER — Emergency Department (HOSPITAL_COMMUNITY): Payer: Medicare Other

## 2018-01-27 ENCOUNTER — Encounter (HOSPITAL_COMMUNITY): Payer: Self-pay

## 2018-01-27 DIAGNOSIS — Z87891 Personal history of nicotine dependence: Secondary | ICD-10-CM

## 2018-01-27 DIAGNOSIS — H9190 Unspecified hearing loss, unspecified ear: Secondary | ICD-10-CM | POA: Diagnosis present

## 2018-01-27 DIAGNOSIS — R64 Cachexia: Secondary | ICD-10-CM | POA: Diagnosis present

## 2018-01-27 DIAGNOSIS — Z79899 Other long term (current) drug therapy: Secondary | ICD-10-CM

## 2018-01-27 DIAGNOSIS — Z515 Encounter for palliative care: Secondary | ICD-10-CM | POA: Diagnosis not present

## 2018-01-27 DIAGNOSIS — E43 Unspecified severe protein-calorie malnutrition: Secondary | ICD-10-CM

## 2018-01-27 DIAGNOSIS — Z66 Do not resuscitate: Secondary | ICD-10-CM | POA: Diagnosis not present

## 2018-01-27 DIAGNOSIS — J438 Other emphysema: Secondary | ICD-10-CM | POA: Diagnosis not present

## 2018-01-27 DIAGNOSIS — E440 Moderate protein-calorie malnutrition: Secondary | ICD-10-CM | POA: Diagnosis present

## 2018-01-27 DIAGNOSIS — Z7401 Bed confinement status: Secondary | ICD-10-CM

## 2018-01-27 DIAGNOSIS — K219 Gastro-esophageal reflux disease without esophagitis: Secondary | ICD-10-CM | POA: Diagnosis present

## 2018-01-27 DIAGNOSIS — Z8701 Personal history of pneumonia (recurrent): Secondary | ICD-10-CM | POA: Diagnosis not present

## 2018-01-27 DIAGNOSIS — E876 Hypokalemia: Secondary | ICD-10-CM | POA: Diagnosis present

## 2018-01-27 DIAGNOSIS — Z91018 Allergy to other foods: Secondary | ICD-10-CM

## 2018-01-27 DIAGNOSIS — G9341 Metabolic encephalopathy: Secondary | ICD-10-CM | POA: Diagnosis present

## 2018-01-27 DIAGNOSIS — I110 Hypertensive heart disease with heart failure: Secondary | ICD-10-CM | POA: Diagnosis present

## 2018-01-27 DIAGNOSIS — N390 Urinary tract infection, site not specified: Secondary | ICD-10-CM | POA: Diagnosis present

## 2018-01-27 DIAGNOSIS — N179 Acute kidney failure, unspecified: Secondary | ICD-10-CM | POA: Diagnosis present

## 2018-01-27 DIAGNOSIS — A419 Sepsis, unspecified organism: Secondary | ICD-10-CM | POA: Diagnosis present

## 2018-01-27 DIAGNOSIS — E785 Hyperlipidemia, unspecified: Secondary | ICD-10-CM | POA: Diagnosis present

## 2018-01-27 DIAGNOSIS — E860 Dehydration: Secondary | ICD-10-CM | POA: Diagnosis present

## 2018-01-27 DIAGNOSIS — Z7189 Other specified counseling: Secondary | ICD-10-CM

## 2018-01-27 DIAGNOSIS — F0390 Unspecified dementia without behavioral disturbance: Secondary | ICD-10-CM | POA: Diagnosis present

## 2018-01-27 DIAGNOSIS — E870 Hyperosmolality and hypernatremia: Secondary | ICD-10-CM | POA: Diagnosis not present

## 2018-01-27 DIAGNOSIS — E872 Acidosis, unspecified: Secondary | ICD-10-CM | POA: Diagnosis present

## 2018-01-27 DIAGNOSIS — Z7982 Long term (current) use of aspirin: Secondary | ICD-10-CM

## 2018-01-27 DIAGNOSIS — R739 Hyperglycemia, unspecified: Secondary | ICD-10-CM | POA: Diagnosis not present

## 2018-01-27 DIAGNOSIS — J449 Chronic obstructive pulmonary disease, unspecified: Secondary | ICD-10-CM | POA: Diagnosis present

## 2018-01-27 DIAGNOSIS — I5030 Unspecified diastolic (congestive) heart failure: Secondary | ICD-10-CM | POA: Diagnosis present

## 2018-01-27 DIAGNOSIS — F0391 Unspecified dementia with behavioral disturbance: Secondary | ICD-10-CM | POA: Diagnosis present

## 2018-01-27 DIAGNOSIS — R918 Other nonspecific abnormal finding of lung field: Secondary | ICD-10-CM | POA: Diagnosis present

## 2018-01-27 DIAGNOSIS — Z681 Body mass index (BMI) 19 or less, adult: Secondary | ICD-10-CM | POA: Diagnosis not present

## 2018-01-27 DIAGNOSIS — Z823 Family history of stroke: Secondary | ICD-10-CM

## 2018-01-27 DIAGNOSIS — I5032 Chronic diastolic (congestive) heart failure: Secondary | ICD-10-CM | POA: Diagnosis not present

## 2018-01-27 LAB — PROTIME-INR
INR: 1.37
Prothrombin Time: 16.8 seconds — ABNORMAL HIGH (ref 11.4–15.2)

## 2018-01-27 LAB — CBC WITH DIFFERENTIAL/PLATELET
Basophils Absolute: 0 10*3/uL (ref 0.0–0.1)
Basophils Relative: 0 %
EOS PCT: 0 %
Eosinophils Absolute: 0 10*3/uL (ref 0.0–0.7)
HEMATOCRIT: 42.2 % (ref 39.0–52.0)
Hemoglobin: 12.8 g/dL — ABNORMAL LOW (ref 13.0–17.0)
LYMPHS ABS: 1.8 10*3/uL (ref 0.7–4.0)
LYMPHS PCT: 9 %
MCH: 31.3 pg (ref 26.0–34.0)
MCHC: 30.3 g/dL (ref 30.0–36.0)
MCV: 103.2 fL — AB (ref 78.0–100.0)
Monocytes Absolute: 0.8 10*3/uL (ref 0.1–1.0)
Monocytes Relative: 4 %
Neutro Abs: 17.1 10*3/uL (ref 1.7–7.7)
Neutrophils Relative %: 87 %
PLATELETS: 313 10*3/uL (ref 150–400)
RBC: 4.09 MIL/uL — AB (ref 4.22–5.81)
RDW: 15.2 % (ref 11.5–15.5)
WBC: 19.7 10*3/uL — AB (ref 4.0–10.5)

## 2018-01-27 LAB — CBG MONITORING, ED: GLUCOSE-CAPILLARY: 141 mg/dL — AB (ref 65–99)

## 2018-01-27 LAB — PROCALCITONIN: PROCALCITONIN: 0.5 ng/mL

## 2018-01-27 LAB — COMPREHENSIVE METABOLIC PANEL
ALT: 39 U/L (ref 17–63)
AST: 54 U/L — ABNORMAL HIGH (ref 15–41)
Albumin: 2.4 g/dL — ABNORMAL LOW (ref 3.5–5.0)
Alkaline Phosphatase: 90 U/L (ref 38–126)
BILIRUBIN TOTAL: 0.8 mg/dL (ref 0.3–1.2)
BUN: 83 mg/dL — ABNORMAL HIGH (ref 6–20)
CALCIUM: 7.8 mg/dL — AB (ref 8.9–10.3)
CO2: 29 mmol/L (ref 22–32)
CREATININE: 1.52 mg/dL — AB (ref 0.61–1.24)
GFR, EST AFRICAN AMERICAN: 46 mL/min — AB (ref >60)
GFR, EST NON AFRICAN AMERICAN: 40 mL/min — AB (ref >60)
Glucose, Bld: 132 mg/dL — ABNORMAL HIGH (ref 65–99)
Potassium: 2.8 mmol/L — ABNORMAL LOW (ref 3.5–5.1)
Sodium: 169 mmol/L (ref 135–145)
TOTAL PROTEIN: 7.2 g/dL (ref 6.5–8.1)

## 2018-01-27 LAB — MAGNESIUM: MAGNESIUM: 2.8 mg/dL — AB (ref 1.7–2.4)

## 2018-01-27 LAB — APTT: APTT: 34 s (ref 24–36)

## 2018-01-27 LAB — PHOSPHORUS: PHOSPHORUS: 4.2 mg/dL (ref 2.5–4.6)

## 2018-01-27 LAB — LACTIC ACID, PLASMA: LACTIC ACID, VENOUS: 3 mmol/L — AB (ref 0.5–1.9)

## 2018-01-27 MED ORDER — POTASSIUM CHLORIDE 10 MEQ/100ML IV SOLN
10.0000 meq | INTRAVENOUS | Status: AC
  Administered 2018-01-28 (×2): 10 meq via INTRAVENOUS
  Filled 2018-01-27 (×2): qty 100

## 2018-01-27 MED ORDER — POTASSIUM CHLORIDE 10 MEQ/100ML IV SOLN: 10.0000 meq | INTRAVENOUS | Status: DC

## 2018-01-27 MED ORDER — DEXTROSE 5 % IV SOLN: INTRAVENOUS | Status: DC

## 2018-01-27 MED ORDER — SODIUM CHLORIDE 0.9 % IV SOLN
1.0000 g | INTRAVENOUS | Status: DC
  Administered 2018-01-28 – 2018-01-31 (×4): 1 g via INTRAVENOUS
  Filled 2018-01-27 (×4): qty 1

## 2018-01-27 MED ORDER — SODIUM CHLORIDE 0.9 % IV SOLN
2.0000 g | Freq: Once | INTRAVENOUS | Status: AC
  Administered 2018-01-27: 2 g via INTRAVENOUS
  Filled 2018-01-27: qty 2

## 2018-01-27 MED ORDER — LACTATED RINGERS IV SOLN
INTRAVENOUS | Status: DC
  Administered 2018-01-28: via INTRAVENOUS

## 2018-01-27 MED ORDER — ONDANSETRON HCL 4 MG/2ML IJ SOLN: 4.0000 mg | Freq: Four times a day (QID) | INTRAMUSCULAR | Status: DC | PRN

## 2018-01-27 MED ORDER — VITAMIN K1 10 MG/ML IJ SOLN
INTRAMUSCULAR | Status: AC
  Filled 2018-01-27: qty 1

## 2018-01-27 MED ORDER — ACETAMINOPHEN 325 MG PO TABS: 650.0000 mg | ORAL_TABLET | Freq: Four times a day (QID) | ORAL | Status: DC | PRN

## 2018-01-27 MED ORDER — POTASSIUM CL IN DEXTROSE 5% 20 MEQ/L IV SOLN
20.0000 meq | INTRAVENOUS | Status: DC
  Filled 2018-01-27 (×4): qty 1000

## 2018-01-27 MED ORDER — SODIUM CHLORIDE 0.9 % IV SOLN
INTRAVENOUS | Status: DC
  Administered 2018-01-27: 20:00:00 via INTRAVENOUS

## 2018-01-27 MED ORDER — ONDANSETRON HCL 4 MG PO TABS: 4.0000 mg | ORAL_TABLET | Freq: Four times a day (QID) | ORAL | Status: DC | PRN

## 2018-01-27 MED ORDER — VANCOMYCIN HCL 500 MG IV SOLR
500.0000 mg | INTRAVENOUS | Status: DC
  Administered 2018-01-28 – 2018-01-29 (×2): 500 mg via INTRAVENOUS
  Filled 2018-01-27 (×4): qty 500

## 2018-01-27 MED ORDER — POTASSIUM CL IN DEXTROSE 5% 20 MEQ/L IV SOLN
20.0000 meq | INTRAVENOUS | Status: DC
  Administered 2018-01-27 – 2018-01-28 (×3): 20 meq via INTRAVENOUS
  Filled 2018-01-27 (×3): qty 1000

## 2018-01-27 MED ORDER — SODIUM CHLORIDE 0.9 % IV BOLUS
1000.0000 mL | Freq: Once | INTRAVENOUS | Status: AC
  Administered 2018-01-27: 1000 mL via INTRAVENOUS

## 2018-01-27 MED ORDER — SODIUM CHLORIDE 0.9 % IV BOLUS (SEPSIS)
1000.0000 mL | Freq: Once | INTRAVENOUS | Status: AC
  Administered 2018-01-27: 1000 mL via INTRAVENOUS

## 2018-01-27 MED ORDER — ACETAMINOPHEN 650 MG RE SUPP
650.0000 mg | Freq: Four times a day (QID) | RECTAL | Status: DC | PRN
  Administered 2018-01-28 (×2): 650 mg via RECTAL
  Filled 2018-01-27 (×2): qty 1

## 2018-01-27 MED ORDER — ENOXAPARIN SODIUM 30 MG/0.3ML ~~LOC~~ SOLN
30.0000 mg | SUBCUTANEOUS | Status: DC
  Administered 2018-01-28 – 2018-01-31 (×4): 30 mg via SUBCUTANEOUS
  Filled 2018-01-27 (×5): qty 0.3

## 2018-01-27 MED ORDER — VANCOMYCIN HCL IN DEXTROSE 1-5 GM/200ML-% IV SOLN
1000.0000 mg | Freq: Once | INTRAVENOUS | Status: AC
  Administered 2018-01-28: 1000 mg via INTRAVENOUS
  Filled 2018-01-27: qty 200

## 2018-01-27 MED ORDER — VITAMIN K1 10 MG/ML IJ SOLN
5.0000 mg | Freq: Once | INTRAVENOUS | Status: AC
  Administered 2018-01-27: 5 mg via INTRAVENOUS
  Filled 2018-01-27: qty 0.5

## 2018-01-27 NOTE — Progress Notes (Addendum)
Pharmacy Antibiotic Note  Chad DerrickJohn [facility] is a 84 y.o. male admitted on 01/27/2018 with UTI.  Pharmacy has been consulted for Vancomycin and Cefepime dosing.  Plan: Vancomycin 1gm IV x 1 Vancomyin 500mg  IV every 24 hours. Goal trough 15-20 Cefepime 1gm IV every 24 hours. Monitor labs, micro and vitals.   Height: 5\' 6"  (167.6 cm) Weight: 121 lb (54.9 kg) IBW/kg (Calculated) : 63.8  Temp (24hrs), Avg:98.4 F (36.9 C), Min:98 F (36.7 C), Max:98.8 F (37.1 C)  Recent Labs  Lab 01/27/18 2006  CREATININE 1.52*  LATICACIDVEN 3.0*    Estimated Creatinine Clearance: 27.6 mL/min (A) (by C-G formula based on SCr of 1.52 mg/dL (H)).    Allergies  Allergen Reactions  . Onion Other (See Comments)    Indigestion    Antimicrobials this admission: Cefepime  6/19 >>  Vanc 6/19 >>   Dose adjustments this admission: n/a   Microbiology results:  BCx:   UCx:    Sputum:    MRSA PCR:   Thank you for allowing pharmacy to be a part of this patient's care.  Mady GemmaHayes, Dewan Emond R 01/27/2018 9:04 PM

## 2018-01-27 NOTE — ED Notes (Signed)
CRITICAL VALUE ALERT  Critical Value:  Lactic acid 3.0  Date & Time Notied:  2040 01/27/2018  Provider Notified: dr. Robb Matarortiz  Orders Received/Actions taken: md notified

## 2018-01-27 NOTE — H&P (Addendum)
4       History and Physical    Chad Everett ZOX:[id number] DOB: [birth year] DOA: 01/27/2018  PCP: [facility]   Patient coming from: [name].  I have personally briefly reviewed patient's old medical records in [facility]  Chief Complaint: Abnormal labs.  HPI: Chad Everett is a 85 y.o. male with medical history significant of anxiety, asthma, dementia, GERD, hyperlipidemia, hypertension, episodes of pneumonia who is brought from [facility] due to abnormal labs and altered level consciousness.  Labs done at the facility showed leukocytosis of more than  25,000 with significant pyuria on urinalysis.  ED Course: Initial vital signs temperature 98 F, pulse 95 respirations 28, O2 sat 92% on room air and blood pressure 119/69 mmHg. His nursing home labs were reviewed.  New labs were drawn to check his lactic acid pro calcitonin and confirm electrolyte abnormalities.  Lab work White count was 19.7 with 87% neutrophils, 9% lymphocytes and 4% monocytes.  Hemoglobin 12.8 g/dL and platelets 811.  PT 16.8 seconds, INR 1.37 and APTT 34 seconds.  His lactic acid was 3.0, sodium 169, potassium 2.8, chloride more than 130 and CO2 29 mmol/L.  BUN is 83, creatinine 1.52, calcium 7.8 (corrected 9.1) and glucose of 132 mg/dL.  AST was mildly elevated at 54 U/L.  Procalcitonin was 0.5 ng/mL.  His 1 view chest radiograph showed patchy right upper lobe airspace opacity and may be artifact, atelectasis or pneumonia.  Review of Systems: Unable to obtain..   Past Medical History:  Diagnosis Date  . Anxiety   . Asthma   . Dementia   . GERD (gastroesophageal reflux disease)   . Hyperlipidemia   . Hypertension   . Pneumonia     Past Surgical History:  Procedure Laterality Date  . HIP PINNING,CANNULATED Right 11/19/2017   Procedure: INTERNAL FIXATION RIGHT HIP WITH DEPUY ACE CANNULATED SCREWS;  Surgeon: Vickki Hearing, MD;  Location: [facility];  Service: Orthopedics;  Laterality:  Right;     reports that he quit smoking about 34 years ago. His smoking use included cigarettes. He has a 10.00 pack-year smoking history. He has never used smokeless tobacco. He reports that he does not drink alcohol or use drugs.  Allergies  Allergen Reactions  . Onion Other (See Comments)    Indigestion    Family History  Problem Relation Age of Onset  . Stroke Sister     Prior to Admission medications   Medication Sig Start Date End Date Taking? Authorizing Provider  acetaminophen (TYLENOL) 325 MG tablet Take 650 mg by mouth every 4 (four) hours as needed for moderate pain.   Yes [provider]  alum & mag hydroxide-simeth (MINTOX) 200-200-20 MG/5ML suspension Take 30 mLs by mouth daily as needed for indigestion or heartburn.   Yes [provider]  aspirin 81 MG tablet Take 81 mg by mouth daily.   Yes [provider]  atorvastatin (LIPITOR) 40 MG tablet Take 40 mg by mouth every evening.    Yes [provider]  cefTRIAXone (ROCEPHIN) IVPB Inject 2 g into the vein once. For UTI starting on 01/27/18   Yes [provider]  Coenzyme Q10 (CO Q-10) 200 MG CAPS Take 1 capsule by mouth daily.   Yes [provider]  guaifenesin (ROBITUSSIN) 100 MG/5ML syrup Take 200 mg by mouth every 6 (six) hours as needed for cough.   Yes [provider]  loperamide (IMODIUM) 2 MG capsule Take 2 mg  by mouth as needed for diarrhea or loose stools.   Yes [provider]  omeprazole (PRILOSEC) 40 MG capsule Take 40 mg by mouth daily.    Yes [provider]  psyllium (REGULOID) 0.52 g capsule Take 0.52 g by mouth daily.   Yes [provider]    Physical Exam: Vitals:   01/27/18 1800 01/27/18 1830 01/27/18 1930 01/27/18 2000  BP: (!) 96/54 91/65 (!) 103/46 119/78  Pulse:      Resp: (!) 30 (!) 30 (!) 24 20  Temp:  98.8 F (37.1 C)    TempSrc:  Rectal    SpO2:   100%   Weight:      Height:         Constitutional: Looks acutely ill.  Eyes: PERRL, lids and conjunctivae are pale. ENMT: Mucous membranes and lips are very dry. Posterior pharynx clear of any exudate or lesions. Neck: normal, supple, no masses, no thyromegaly Respiratory: Mildly tachypneic at 23 RPM.  Decreased breath sounds on bases, otherwise clear to auscultation bilaterally, no wheezing, no crackles. No accessory muscle use.  Cardiovascular: Regular rate and rhythm, no murmurs / rubs / gallops. No extremity edema. 2+ pedal pulses. No carotid bruits.  Abdomen: Soft,  no guarding/rebound/masses palpated. No hepatosplenomegaly. Bowel sounds positive.  Musculoskeletal: no clubbing / cyanosis.  Good ROM, no contractures. Normal muscle tone.  Skin: Stage I pressure injury on sacral area. Neurologic: Stuporous.  Responds to tactile stimuli.  Unable to fully evaluate. Psychiatric: Stuporous.   Labs on Admission: I have personally reviewed following labs and imaging studies  CBC: Recent Labs  Lab 01/27/18 2006  WBC 19.7*  NEUTROABS 17.1  HGB 12.8*  HCT 42.2  MCV 103.2*  PLT 313   Basic Metabolic Panel: Recent Labs  Lab 01/27/18 2006  NA 169*  K 2.8*  CL >130*  CO2 29  GLUCOSE 132*  BUN 83*  CREATININE 1.52*  CALCIUM 7.8*   GFR: Estimated Creatinine Clearance: 27.6 mL/min (A) (by C-G formula based on SCr of 1.52 mg/dL (H)). Liver Function Tests: Recent Labs  Lab 01/27/18 2006  AST 54*  ALT 39  ALKPHOS 90  BILITOT 0.8  PROT 7.2  ALBUMIN 2.4*   No results for input(s): LIPASE, AMYLASE in the last 168 hours. No results for input(s): AMMONIA in the last 168 hours. Coagulation Profile: Recent Labs  Lab 01/27/18 2006  INR 1.37   Cardiac Enzymes: No results for input(s): CKTOTAL, CKMB, CKMBINDEX, TROPONINI in the last 168 hours. BNP (last 3 results) No results for input(s): PROBNP in the last 8760 hours. HbA1C: No results for input(s): HGBA1C in the last 72 hours. CBG: Recent Labs  Lab  01/27/18 1746  GLUCAP 141*   Lipid Profile: No results for input(s): CHOL, HDL, LDLCALC, TRIG, CHOLHDL, LDLDIRECT in the last 72 hours. Thyroid Function Tests: No results for input(s): TSH, T4TOTAL, FREET4, T3FREE, THYROIDAB in the last 72 hours. Anemia Panel: No results for input(s): VITAMINB12, FOLATE, FERRITIN, TIBC, IRON, RETICCTPCT in the last 72 hours. Urine analysis:    Component Value Date/Time   COLORURINE YELLOW 10/22/2014 0932   APPEARANCEUR CLEAR 10/22/2014 0932   APPEARANCEUR Hazy 02/13/2012 1956   LABSPEC 1.015 10/22/2014 0932   LABSPEC 1.016 02/13/2012 1956   PHURINE 7.0 10/22/2014 0932   GLUCOSEU NEGATIVE 10/22/2014 0932   GLUCOSEU 150 mg/dL 16/05/9603 5409   HGBUR TRACE (A) 10/22/2014 0932   BILIRUBINUR NEGATIVE 10/22/2014 0932   BILIRUBINUR Negative 02/13/2012 1956   KETONESUR NEGATIVE 10/22/2014  0932   PROTEINUR 30 (A) 10/22/2014 0932   UROBILINOGEN 0.2 10/22/2014 0932   NITRITE NEGATIVE 10/22/2014 0932   LEUKOCYTESUR NEGATIVE 10/22/2014 0932   LEUKOCYTESUR Negative 02/13/2012 1956    Radiological Exams on Admission: Dg Chest Port 1 View  Result Date: 01/27/2018 CLINICAL DATA:  Code sepsis.  History of dementia. EXAM: PORTABLE CHEST 1 VIEW COMPARISON:  Chest radiograph November 18, 2017 FINDINGS: Limited by patient positioning. Cardiomediastinal silhouette is normal. Calcified aortic knob. Patchy RIGHT upper lobe airspace opacity. Mild chronic interstitial changes. No pleural effusion. No pneumothorax. Osteopenia. IMPRESSION: Patchy RIGHT upper lobe airspace opacity may be artifact, atelectasis or pneumonia. Aortic Atherosclerosis (ICD10-I70.0). Electronically Signed   By: Awilda Metroourtnay  Bloomer M.D.   On: 01/27/2018 19:58    EKG: Independently reviewed. Still in ordered status.  Assessment/Plan Principal Problem:   Sepsis secondary to UTI (HCC) Admit to stepdown/inpatient. Continue IV fluids. Supplemental oxygen as needed. The patient received 2 g of  Rocephin at the nursing home. However, will start cefepime per pharmacy to cover for . Follow-up urine culture and sensitivity.  Active Problems:   Lactic acidosis Secondary to sepsis and severe volume constriction. Continue IVF and IV antibiotics. Follow up lactic acid level.    Abnormal chest X-ray Patient was initially hypoxic at 92% on room air. However, his chest radiograph is read as a patchy right upper lobe airspace opacity. I will add vancomycin to his regimen to cover for HCAP.    Hypernatremia Free water deficit is 5.7 L. Start D5 water plus KCl 20 mEq at 6 mL/kg/hr until target sodium level of 145 mmol/L. Switch to D5 water after first 3 L.    Hypokalemia Replacing. Check magnesium level. Follow-up potassium level.    Dementia Supportive care. Should improve as sodium level decreases.    GERD (gastroesophageal reflux disease) Famotidine 20 mg IVP every 12 hours while obtunded. Resume PPI once the patient is alert enough to swallow.    COPD (chronic obstructive pulmonary disease) (HCC) Supplemental oxygen and bronchodilators as needed.    Diastolic heart failure (HCC) No signs of decompensation.    Malnutrition of moderate degree Nutritional services evaluation.      DVT prophylaxis: Lovenox SQ. Code Status: Full code. Family Communication: None at bedside. Disposition Plan: Admit to stepdown for hyponatremia, healthcare associated pneumonia and UTI. Consults called:  Admission status: Inpatient/stepdown.   Bobette Moavid Manuel Ortiz MD [facility] Pager [phone].  If 7PM-7AM, please contact night-coverage [email or web address] Password [email or web address]  01/27/2018, 9:12 PM

## 2018-01-27 NOTE — ED Notes (Signed)
[facility]

## 2018-01-27 NOTE — ED Provider Notes (Signed)
[facility] EMERGENCY DEPARTMENT Provider Note   CSN: [id number] Arrival date & time: 01/27/18  1730     History   Chief Complaint Chief Complaint  Patient presents with  . Abnormal Lab    HPI Chad Everett is a 84 y.o. male.  HPI   Patient with poor memory was sent here from his nursing care facility to evaluate "abnormal labs and see if he has an infection in the urine."  He was treated with IM Rocephin, this afternoon after labs returned indicating UTI, and significant volume depletion with hypernatremia.  Level 5 caveat-dementia   Past Medical History:  Diagnosis Date  . Anxiety   . Asthma   . Dementia   . GERD (gastroesophageal reflux disease)   . Hyperlipidemia   . Hypertension   . Pneumonia     Patient Active Problem List   Diagnosis Date Noted  . S/P ORIF (open reduction internal fixation) fracture right hip cannulated screw placement 11/19/17 12/03/2017  . Pressure injury of skin 11/21/2017  . Malnutrition of moderate degree 11/19/2017  . Closed right hip fracture, initial encounter (HCC) 11/18/2017  . Hyperlipidemia 11/18/2017  . Encephalopathy 10/22/2014  . Acute dyspnea 10/21/2014  . HCAP (healthcare-associated pneumonia) 10/21/2014  . Diastolic heart failure (HCC) 10/21/2014  . Aspiration pneumonia (HCC) 08/13/2014  . COPD (chronic obstructive pulmonary disease) (HCC) 08/13/2014  . Acute respiratory failure with hypoxia (HCC) 08/13/2014  . Hypokalemia 08/13/2014  . Chronic diastolic congestive heart failure (HCC) 08/13/2014  . Weakness 08/11/2014  . Near syncope 05/17/2013  . Syncope 05/17/2013  . Dementia   . GERD (gastroesophageal reflux disease)   . Anxiety     Past Surgical History:  Procedure Laterality Date  . HIP PINNING,CANNULATED Right 11/19/2017   Procedure: INTERNAL FIXATION RIGHT HIP WITH DEPUY ACE CANNULATED SCREWS;  Surgeon: Vickki Hearing, MD;  Location: [facility];  Service: Orthopedics;  Laterality: Right;        Home  Medications    Prior to Admission medications   Medication Sig Start Date End Date Taking? Authorizing Provider  acetaminophen (TYLENOL) 325 MG tablet Take 650 mg by mouth every 4 (four) hours as needed for moderate pain.    [provider]  alum & mag hydroxide-simeth (MINTOX) 200-200-20 MG/5ML suspension Take 30 mLs by mouth daily as needed for indigestion or heartburn.    [provider]  aspirin EC 325 MG EC tablet Take 1 tablet (325 mg total) by mouth daily with breakfast. 11/22/17   Esperanza Sheets, MD  atorvastatin (LIPITOR) 40 MG tablet Take 40 mg by mouth every morning.    [provider]  Coenzyme Q10 (CO Q-10) 200 MG CAPS Take 1 capsule by mouth daily.    [provider]  guaifenesin (ROBITUSSIN) 100 MG/5ML syrup Take 200 mg by mouth every 6 (six) hours as needed for cough.    [provider]  loperamide (IMODIUM) 2 MG capsule Take 2 mg by mouth as needed for diarrhea or loose stools.    [provider]  Neomycin-Bacitracin-Polymyxin (TRIPLE ANTIBIOTIC) [phone] OINT Apply 1 application topically as needed (for minor skin tears or abrasions).    [provider]  omeprazole (PRILOSEC) 20 MG capsule Take 40 mg by mouth daily.     [provider]  psyllium (REGULOID) 0.52 g capsule Take 0.52 g by mouth daily.    [provider]    Family History Family History  Problem Relation Age of Onset  . Stroke Sister  Social History Social History   Tobacco Use  . Smoking status: Former Smoker    Packs/day: 0.50    Years: 20.00    Pack years: 10.00    Types: Cigarettes    Last attempt to quit: 08/12/1983    Years since quitting: 34.4  . Smokeless tobacco: Never Used  Substance Use Topics  . Alcohol use: No  . Drug use: No     Allergies   Onion   Review of Systems Review of Systems  Unable to perform ROS: Dementia     Physical Exam Updated Vital Signs BP 91/65   Pulse 95   Temp  98.8 F (37.1 C) (Rectal)   Resp (!) 30   Ht 5\' 6"  (1.676 m)   Wt 54.9 kg (121 lb)   SpO2 92%   BMI 19.53 kg/m   Physical Exam  Constitutional: He appears well-developed.  Elderly, frail  HENT:  Head: Normocephalic and atraumatic.  Right Ear: External ear normal.  Left Ear: External ear normal.  Very dry oral mucous membranes.  No visible foreign body in the mouth.  Eyes: Pupils are equal, round, and reactive to light. Conjunctivae and EOM are normal. Right eye exhibits no discharge. Left eye exhibits no discharge.  Neck: Phonation normal. Neck supple.  Cardiovascular: Normal rate, regular rhythm and normal heart sounds.  Hypotensive  Pulmonary/Chest: Effort normal. No stridor. No respiratory distress. He exhibits no bony tenderness.  Tachypnea, no audible rales or rhonchi.  Abdominal: Soft. There is no tenderness.  Musculoskeletal: Normal range of motion.  Neurological: No cranial nerve deficit.  No evident facial asymmetry.  Patient lying supine, right lateral to cubitus position, flexed knees and hips.  Skin: Skin is warm, dry and intact.  Psychiatric:  Lethargic, responsive to touch but not voice.  Nursing note and vitals reviewed.    ED Treatments / Results  Labs (all labs ordered are listed, but only abnormal results are displayed) Labs Reviewed  CBG MONITORING, ED - Abnormal; Notable for the following components:      Result Value   Glucose-Capillary 141 (*)    All other components within normal limits    EKG None  Radiology No results found.  Procedures Procedures (including critical care time)  Medications Ordered in ED Medications  sodium chloride 0.9 % bolus 1,000 mL (1,000 mLs Intravenous New Bag/Given 01/27/18 1832)  0.9 %  sodium chloride infusion (has no administration in time range)     Initial Impression / Assessment and Plan / ED Course  I have reviewed the triage vital signs and the nursing notes.  Pertinent labs & imaging results that  were available during my care of the patient were reviewed by me and considered in my medical decision making (see chart for details).  Clinical Course as of Jan 27 1917  Wed Jan 27, 2018  1820 Labs ordered and obtained today at 3:42 PM, in the outpatient setting, images attached: Abnormalities include elevated white count, left shift, elevated sodium, low potassium, elevated glucose, elevated creatinine, elevated BUN, elevated osmolality, low GFR, TNTC white and red cells in urine, bacteria in urine   [EW]  1839 Rectal temperature at 6:30 PM, 98.8, normal.   [EW]    Clinical Course User Index [EW] Mancel Bale, MD               Patient Vitals for the past 24 hrs:  BP Temp Temp src Pulse Resp SpO2 Height Weight  01/27/18 1830 91/65 98.8 F (37.1 C)  Rectal - (!) 30 - - -  01/27/18 1800 (!) 96/54 - - - (!) 30 - - -  01/27/18 1748 119/69 98 F (36.7 C) Axillary 95 (!) 28 92 % - -  01/27/18 1738 - - - - - - 5\' 6"  (1.676 m) 54.9 kg (121 lb)    6:24 PM Reevaluation with update and discussion. After initial assessment and treatment, an updated evaluation reveals no change in status. Mancel BaleElliott Tilly Pernice   Medical Decision Making: Patient with chronic illness, debilitated, living in a nursing care facility with dementia, apparently being worked up for failure to thrive, found to have marked sodium elevation, at 172 indicating moderately severe dehydration.  Abnormal urinalysis possibly indicating urinary tract infection.  Initial blood pressure normal, on repeat 96/54.  Suspect hypotension related to volume depletion, not sepsis.  Doubt metabolic instability or impending vascular collapse.  ED treatment included evaluation, and initiation of IV fluids.  Patient is already received a day dose of antibiotic, Rocephin, to treat UTI.  CRITICAL CARE-no Performed by: Mancel BaleElliott Aulani Shipton   Nursing Notes Reviewed/ Care Coordinated Applicable Imaging Reviewed Interpretation of Laboratory Data  incorporated into ED treatment   7:02 PM-Consult complete with hospitalist. Patient case explained and discussed.  He agrees to admit patient for further evaluation and treatment. Call ended at 7:10 PM  Plan: Admit    Final Clinical Impressions(s) / ED Diagnoses   Final diagnoses:  Urinary tract infection without hematuria, site unspecified  Dehydration  Hypernatremia  Hypokalemia    ED Discharge Orders    None       Mancel BaleWentz, Flordia Kassem, MD 01/27/18 1918

## 2018-01-27 NOTE — ED Notes (Signed)
CRITICAL VALUE ALERT  Critical Value:  Chloride- >130                          Sodium- 169  Date & Time Notied:  01/27/2018 2050  Provider Notified: [email or web address]  Orders Received/Actions taken: md notified

## 2018-01-27 NOTE — ED Triage Notes (Signed)
[name] abnormal labs, and possible UTI.

## 2018-01-28 ENCOUNTER — Encounter (HOSPITAL_COMMUNITY): Payer: Self-pay | Admitting: Primary Care

## 2018-01-28 DIAGNOSIS — A419 Sepsis, unspecified organism: Principal | ICD-10-CM

## 2018-01-28 DIAGNOSIS — Z515 Encounter for palliative care: Secondary | ICD-10-CM

## 2018-01-28 DIAGNOSIS — E860 Dehydration: Secondary | ICD-10-CM

## 2018-01-28 DIAGNOSIS — F0391 Unspecified dementia with behavioral disturbance: Secondary | ICD-10-CM

## 2018-01-28 DIAGNOSIS — E440 Moderate protein-calorie malnutrition: Secondary | ICD-10-CM

## 2018-01-28 DIAGNOSIS — E870 Hyperosmolality and hypernatremia: Secondary | ICD-10-CM

## 2018-01-28 DIAGNOSIS — Z7189 Other specified counseling: Secondary | ICD-10-CM

## 2018-01-28 DIAGNOSIS — N390 Urinary tract infection, site not specified: Secondary | ICD-10-CM

## 2018-01-28 DIAGNOSIS — E876 Hypokalemia: Secondary | ICD-10-CM

## 2018-01-28 DIAGNOSIS — E872 Acidosis: Secondary | ICD-10-CM

## 2018-01-28 LAB — CBC WITH DIFFERENTIAL/PLATELET
Basophils Absolute: 0 10*3/uL (ref 0.0–0.1)
Basophils Relative: 0 %
EOS PCT: 0 %
Eosinophils Absolute: 0 10*3/uL (ref 0.0–0.7)
HEMATOCRIT: 39.8 % (ref 39.0–52.0)
Hemoglobin: 11.9 g/dL — ABNORMAL LOW (ref 13.0–17.0)
LYMPHS PCT: 11 %
Lymphs Abs: 2.2 10*3/uL (ref 0.7–4.0)
MCH: 31.3 pg (ref 26.0–34.0)
MCHC: 29.9 g/dL — AB (ref 30.0–36.0)
MCV: 104.7 fL — ABNORMAL HIGH (ref 78.0–100.0)
MONO ABS: 0.7 10*3/uL (ref 0.1–1.0)
MONOS PCT: 4 %
NEUTROS ABS: 17.7 10*3/uL — AB (ref 1.7–7.7)
Neutrophils Relative %: 85 %
PLATELETS: 285 10*3/uL (ref 150–400)
RBC: 3.8 MIL/uL — ABNORMAL LOW (ref 4.22–5.81)
RDW: 15.5 % (ref 11.5–15.5)
WBC: 20.6 10*3/uL — ABNORMAL HIGH (ref 4.0–10.5)

## 2018-01-28 LAB — GLUCOSE, CAPILLARY
GLUCOSE-CAPILLARY: 80 mg/dL (ref 65–99)
GLUCOSE-CAPILLARY: 121 mg/dL — AB (ref 65–99)
Glucose-Capillary: 252 mg/dL — ABNORMAL HIGH (ref 65–99)
Glucose-Capillary: 141 mg/dL — ABNORMAL HIGH (ref 65–99)

## 2018-01-28 LAB — BASIC METABOLIC PANEL
Anion gap: 9 (ref 5–15)
Anion gap: 6 (ref 5–15)
BUN: 77 mg/dL — ABNORMAL HIGH (ref 6–20)
BUN: 69 mg/dL — ABNORMAL HIGH (ref 6–20)
CALCIUM: 7.8 mg/dL — AB (ref 8.9–10.3)
CHLORIDE: 128 mmol/L — AB (ref 101–111)
CO2: 26 mmol/L (ref 22–32)
CO2: 27 mmol/L (ref 22–32)
CREATININE: 1.33 mg/dL — AB (ref 0.61–1.24)
CREATININE: 1.36 mg/dL — AB (ref 0.61–1.24)
Calcium: 7.5 mg/dL — ABNORMAL LOW (ref 8.9–10.3)
Chloride: >130 mmol/L (ref 101–111)
GFR calc non Af Amer: 46 mL/min — ABNORMAL LOW (ref >60)
GFR calc non Af Amer: 47 mL/min — ABNORMAL LOW (ref >60)
GFR, EST AFRICAN AMERICAN: 53 mL/min — AB (ref >60)
GFR, EST AFRICAN AMERICAN: 55 mL/min — AB (ref >60)
Glucose, Bld: 234 mg/dL — ABNORMAL HIGH (ref 65–99)
Glucose, Bld: 347 mg/dL — ABNORMAL HIGH (ref 65–99)
Potassium: 3.3 mmol/L — ABNORMAL LOW (ref 3.5–5.1)
Potassium: 3.3 mmol/L — ABNORMAL LOW (ref 3.5–5.1)
SODIUM: 168 mmol/L — AB (ref 135–145)
Sodium: 161 mmol/L (ref 135–145)

## 2018-01-28 LAB — MRSA PCR SCREENING: MRSA by PCR: POSITIVE — AB

## 2018-01-28 LAB — LACTIC ACID, PLASMA: Lactic Acid, Venous: 2.8 mmol/L (ref 0.5–1.9)

## 2018-01-28 LAB — STREP PNEUMONIAE URINARY ANTIGEN: Strep Pneumo Urinary Antigen: NEGATIVE

## 2018-01-28 MED ORDER — POTASSIUM CHLORIDE 10 MEQ/100ML IV SOLN: 10.0000 meq | INTRAVENOUS | Status: DC

## 2018-01-28 MED ORDER — MUPIROCIN 2 % EX OINT
1.0000 "application " | TOPICAL_OINTMENT | Freq: Two times a day (BID) | CUTANEOUS | Status: DC
  Administered 2018-01-29 – 2018-02-01 (×8): 1 via NASAL
  Filled 2018-01-28 (×2): qty 22

## 2018-01-28 MED ORDER — POTASSIUM CL IN DEXTROSE 5% 20 MEQ/L IV SOLN
20.0000 meq | INTRAVENOUS | Status: DC
  Administered 2018-01-28 – 2018-01-31 (×6): 20 meq via INTRAVENOUS
  Filled 2018-01-28 (×12): qty 1000

## 2018-01-28 MED ORDER — INSULIN ASPART 100 UNIT/ML ~~LOC~~ SOLN
0.0000 [IU] | SUBCUTANEOUS | Status: DC
  Administered 2018-01-28: 11 [IU] via SUBCUTANEOUS

## 2018-01-28 MED ORDER — INSULIN ASPART 100 UNIT/ML ~~LOC~~ SOLN
0.0000 [IU] | SUBCUTANEOUS | Status: DC
  Administered 2018-01-28 – 2018-01-30 (×6): 1 [IU] via SUBCUTANEOUS

## 2018-01-28 MED ORDER — CHLORHEXIDINE GLUCONATE CLOTH 2 % EX PADS
6.0000 | MEDICATED_PAD | Freq: Every day | CUTANEOUS | Status: DC
  Administered 2018-01-29 – 2018-02-01 (×4): 6 via TOPICAL

## 2018-01-28 NOTE — Consult Note (Addendum)
Consultation Note Date: 01/28/2018   Patient Name: Chad Everett  DOB: 10-12-1930  MRN: [id number]  Age / Sex: 84 y.o., male  PCP: [facility] Referring Physician: Catarina Hartshorn, MD  Reason for Consultation: Establishing goals of care  HPI/Patient Profile: 84 y.o. male  with past medical history of dementia, high blood pressure and cholesterol, anxiety, GERD, right hip pinning April 2019, former smoker, SNF resident admitted on 01/27/2018 with sepsis related to UTI.   Clinical Assessment and Goals of Care: Chad Everett is lying quietly in bed.  He will not speak to me, only briefly opens his eyes but does not make eye contact.  He appears acutely/critically ill, cachectic.  There is no family at bedside at this time.  Call to legal guardian, [facility].  Chad Everett social worker is Weldon Inches [phone] is her direct number.  We talked about the severity of Chad Everett illness.  Mrs. Andrey Campanile states that she is surprised, she had a meeting with residential SNF, [name], a few weeks ago and was told that he was doing okay.  View his labs in detail including increased lactic acid, increased white blood cells, increased sodium.  We discussed the treatment plan in detail including IV fluid, IV antibiotics, labs scheduled for tomorrow, speech therapy consult recommending n.p.o. Status.  We talked about diet nutrition.  Ms. Andrey Campanile states that Chad Everett "normally eats very well".  We talked about his weight at 101 pounds, BMI 16.  Ms. Andrey Campanile states that her understanding is that Mr. Tan has put on weight since being admitted to [facility].  I share my concern that Chad Everett may not survive this hospitalization because of advanced age, advanced dementia, severe dehydration, sepsis, UTI.  We talked about CODE STATUS, allow a natural death.  Ms. Andrey Campanile states that she, as usual, must speak with her  supervisor before changing CODE STATUS.  All questions answered.  We plan for a telephone call 6/20 at 10:30 AM.  Return call from DSS social worker [place].  She requests that we fax physician's note stating DNR is the best choice for Chad Everett.  Fax number [phone].  Fax sent attention Jinny Sanders.  Conference with hospitalist related to plan of care, DSS social work conversation.  LEGAL GUARDIAN [facility].  Social worker is Weldon Inches, SW.  Direct number [phone].   SUMMARY OF RECOMMENDATIONS   24 to 48 hours for outcomes. Update with DSS social worker, Weldon Inches, 6/21 at 10:30 AM.  Code Status/Advance Care Planning:  Full code -at this point.  Considering CODE STATUS, must be discussed with DSS supervisor.  Symptom Management:   Per hospitalist, no additional needs at this time.  Palliative Prophylaxis:   Aspiration and Turn Reposition  Additional Recommendations (Limitations, Scope, Preferences):  Full Scope Treatment  Psycho-social/Spiritual:   Desire for further Chaplaincy support:no  Additional Recommendations: Caregiving  Support/Resources and Education on Hospice  Prognosis:   Unable to determine, if comfort and dignity, let nature take its course,  is chosen then Chad Everett would likely have 2 weeks or less to live.  Even with aggressive care, 3 months or less would not be surprising based on severity of this illness related to dementia, frailty, dehydration with poor p.o. intake, sepsis.  Discharge Planning: To be determined, return to residential SNF versus residential hospice      Primary Diagnoses: Present on Admission: . Sepsis secondary to UTI (HCC) . Dementia . GERD (gastroesophageal reflux disease) . COPD (chronic obstructive pulmonary disease) (HCC) . Diastolic heart failure (HCC) . Hypernatremia . Lactic acidosis . Hypokalemia . Malnutrition of moderate degree   I have reviewed the medical record, interviewed the  patient and family, and examined the patient. The following aspects are pertinent.  Past Medical History:  Diagnosis Date  . Anxiety   . Asthma   . Dementia   . GERD (gastroesophageal reflux disease)   . Hyperlipidemia   . Hypertension   . Pneumonia    Social History   Socioeconomic History  . Marital status: Single    Spouse name: Not on file  . Number of children: Not on file  . Years of education: Not on file  . Highest education level: Not on file  Occupational History  . Not on file  Social Needs  . Financial resource strain: Not on file  . Food insecurity:    Worry: Not on file    Inability: Not on file  . Transportation needs:    Medical: Not on file    Non-medical: Not on file  Tobacco Use  . Smoking status: Former Smoker    Packs/day: 0.50    Years: 20.00    Pack years: 10.00    Types: Cigarettes    Last attempt to quit: 08/12/1983    Years since quitting: 34.4  . Smokeless tobacco: Never Used  Substance and Sexual Activity  . Alcohol use: No  . Drug use: No  . Sexual activity: Not Currently  Lifestyle  . Physical activity:    Days per week: Not on file    Minutes per session: Not on file  . Stress: Not on file  Relationships  . Social connections:    Talks on phone: Not on file    Gets together: Not on file    Attends religious service: Not on file    Active member of club or organization: Not on file    Attends meetings of clubs or organizations: Not on file    Relationship status: Not on file  Other Topics Concern  . Not on file  Social History Narrative  . Not on file   Family History  Problem Relation Age of Onset  . Stroke Sister    Scheduled Meds: . enoxaparin (LOVENOX) injection  30 mg Subcutaneous Q24H  . insulin aspart  0-9 Units Subcutaneous Q4H   Continuous Infusions: . ceFEPime (MAXIPIME) IV    . dextrose 5 % with KCl 20 mEq / L 20 mEq (01/28/18 0927)  . vancomycin     PRN Meds:.acetaminophen **OR** acetaminophen,  ondansetron **OR** ondansetron (ZOFRAN) IV Medications Prior to Admission:  Prior to Admission medications   Medication Sig Start Date End Date Taking? Authorizing Provider  acetaminophen (TYLENOL) 325 MG tablet Take 650 mg by mouth every 4 (four) hours as needed for moderate pain.   Yes [provider]  alum & mag hydroxide-simeth (MINTOX) 200-200-20 MG/5ML suspension Take 30 mLs by mouth daily as needed for indigestion or heartburn.   Yes [provider]  aspirin 81 MG tablet Take 81 mg by mouth daily.   Yes [provider]  atorvastatin (LIPITOR) 40 MG tablet Take 40 mg by mouth every evening.    Yes [provider]  cefTRIAXone (ROCEPHIN) IVPB Inject 2 g into the vein once. For UTI starting on 01/27/18   Yes [provider]  Coenzyme Q10 (CO Q-10) 200 MG CAPS Take 1 capsule by mouth daily.   Yes [provider]  guaifenesin (ROBITUSSIN) 100 MG/5ML syrup Take 200 mg by mouth every 6 (six) hours as needed for cough.   Yes [provider]  loperamide (IMODIUM) 2 MG capsule Take 2 mg by mouth as needed for diarrhea or loose stools.   Yes [provider]  omeprazole (PRILOSEC) 40 MG capsule Take 40 mg by mouth daily.    Yes [provider]  psyllium (REGULOID) 0.52 g capsule Take 0.52 g by mouth daily.   Yes [provider]   Allergies  Allergen Reactions  . Onion Other (See Comments)    Indigestion   Review of Systems  Unable to perform ROS: Dementia    Physical Exam  Constitutional: No distress.   Appears cachectic, very weak and frail.  Does not respond, will only briefly open eyes but not make eye contact  HENT:  Head: Atraumatic.  Temporal wasting  Cardiovascular: Normal rate.  Pulmonary/Chest: Effort normal. No respiratory distress.  Abdominal: Soft. He exhibits no distension.  Musculoskeletal: He exhibits no edema.  Cachectic, severe muscle wasting  Neurological:  Only briefly open eyes,  not make eye contact.  Known dementia  Skin: Skin is warm and dry.  Nursing note and vitals reviewed.   Vital Signs: BP (!) 98/47   Pulse 91   Temp 100 F (37.8 C) (Axillary)   Resp 17   Ht 5\' 6"  (1.676 m)   Wt 46.1 kg (101 lb 10.1 oz)   SpO2 100%   BMI 16.40 kg/m  Pain Scale: Faces       SpO2: SpO2: 100 % O2 Device:SpO2: 100 % O2 Flow Rate: .   IO: Intake/output summary:   Intake/Output Summary (Last 24 hours) at 01/28/2018 1520 Last data filed at 01/28/2018 1400 Gross per 24 hour  Intake 4316.67 ml  Output 75 ml  Net 4241.67 ml    LBM: Last BM Date: 01/28/18 Baseline Weight: Weight: 54.9 kg (121 lb) Most recent weight: Weight: 46.1 kg (101 lb 10.1 oz)     Palliative Assessment/Data:   Flowsheet Rows     Most Recent Value  Intake Tab  Referral Department  Hospitalist  Unit at Time of Referral  Intermediate Care Unit  Palliative Care Primary Diagnosis  Sepsis/Infectious Disease  Date Notified  01/28/18  Palliative Care Type  New Palliative care  Reason for referral  Clarify Goals of Care  Date of Admission  01/27/18  Date first seen by Palliative Care  01/28/18  # of days Palliative referral response time  0 Day(s)  # of days IP prior to Palliative referral  1  Clinical Assessment  Palliative Performance Scale Score  10%  Pain Max last 24 hours  Not able to report  Pain Min Last 24 hours  Not able to report  Dyspnea Max Last 24 Hours  Not able to report  Dyspnea Min Last 24 hours  Not able to report  Psychosocial & Spiritual Assessment  Palliative Care Outcomes  Patient/Family meeting held?  Yes  Who was at the meeting?  DSS Child psychotherapistsocial worker,  Weldon Inches, via phone  Palliative Care Outcomes  Provided advance care planning, Clarified goals of care, Provided psychosocial or spiritual support      Time In: 1400 Time Out: 1510 Time Total: 70 minutes Greater than 50%  of this time was spent counseling and coordinating care related to the above assessment  and plan.  Signed by: Katheran Awe, NP   Please contact Palliative Medicine Team phone at [phone] for questions and concerns.  For individual provider: See Loretha Stapler

## 2018-01-28 NOTE — Progress Notes (Signed)
Sodium is improving and patient's blood glucose noted to be quite elevated.  I have cut down D5 infusion rate in half to 150 cc/h and added SSI for coverage.

## 2018-01-28 NOTE — Plan of Care (Signed)
Palliative care consulted 

## 2018-01-28 NOTE — Evaluation (Signed)
Clinical/Bedside Swallow Evaluation Patient Details  Name: Chad Everett MRN: [id number] Date of Birth: [birth year]  Today's Date: 01/28/2018 Time: SLP Start Time (ACUTE ONLY): 1334 SLP Stop Time (ACUTE ONLY): 1352 SLP Time Calculation (min) (ACUTE ONLY): 18 min  Past Medical History:  Past Medical History:  Diagnosis Date  . Anxiety   . Asthma   . Dementia   . GERD (gastroesophageal reflux disease)   . Hyperlipidemia   . Hypertension   . Pneumonia    Past Surgical History:  Past Surgical History:  Procedure Laterality Date  . HIP PINNING,CANNULATED Right 11/19/2017   Procedure: INTERNAL FIXATION RIGHT HIP WITH DEPUY ACE CANNULATED SCREWS;  Surgeon: Vickki Hearing, MD;  Location: [facility];  Service: Orthopedics;  Laterality: Right;   HPI:  84 y.o. male with medical history significant of anxiety, asthma, dementia, GERD, hyperlipidemia, hypertension, episodes of pneumonia who is brought from NH due to abnormal labs and altered level of consciousness.  Labs done at the facility showed leukocytosis of more than  25,000 with significant pyuria on urinalysis; CXR on 01/27/18 indicated patchy right upper lobe opacity; previous MBS on 11/04/14 indicated prominent CP segment; one episode of trace flash penetration; mild oropharyngeal dysphagia with Dysphagia 3 (mechanical soft) diet/thin liquids recommended at this time; no family available to report on baseline functioning re: swallowing.  Assessment / Plan / Recommendation Clinical Impression   Limited BSE completed d/t pt's overall decreased cognitive status and LOA; attempted thin via straw (restricted amounts), puree and ice chips with oral holding, decreased oral preparation/manipulation and limited propulsion noted with all consistencies, suspected delay in the initiation of the swallow and multiple swallows noted with all consistencies; no overt s/s of aspiration noted with limited amounts given with max multi-modal cueing provided by SLP;  pt also exhibited grimacing with swallowing indicating possible odynophagia (pain with swallowing), but limited OME performed d/t pt's cognitive status/LOA and inability to follow 1-step commands during BSE;recommend NPO at this time and ST will f/u for potential objective testing if pt able and recommendation for safest diet/liquids as pt's mentation improves; thank you for this consult. SLP Visit Diagnosis: Dysphagia, oropharyngeal phase (R13.12)    Aspiration Risk  Moderate aspiration risk    Diet Recommendation   NPO  Medication Administration: Via alternative means    Other  Recommendations Oral Care Recommendations: Oral care QID   Follow up Recommendations Skilled Nursing facility;24 hour supervision/assistance      Frequency and Duration min 2x/week  1 week       Prognosis Prognosis for Safe Diet Advancement: Guarded Barriers to Reach Goals: Cognitive deficits      Swallow Study   General Date of Onset: 01/27/18 HPI: 84 y.o. male with medical history significant of anxiety, asthma, dementia, GERD, hyperlipidemia, hypertension, episodes of pneumonia who is brought from [facility] due to abnormal labs and altered level consciousness.  Labs done at the facility showed leukocytosis of more than  25,000 with significant pyuria on urinalysis Type of Study: Bedside Swallow Evaluation Previous Swallow Assessment: MBS 11/07/14 indicating mild oropharyngeal dysphagia; rec'd Dysphagia 3/thin Diet Prior to this Study: NPO Temperature Spikes Noted: Yes Respiratory Status: Room air History of Recent Intubation: No Behavior/Cognition: Confused;Lethargic/Drowsy Oral Cavity Assessment: Other (comment)(DTA d/t LOA/cognition) Oral Care Completed by SLP: Other (Comment)(minimally d/t LOA/positioning) Oral Cavity - Dentition: Edentulous Self-Feeding Abilities: Total assist Patient Positioning: Upright in bed;Other (comment)(leaning right despite attempts to elevate head) Baseline Vocal  Quality: Not observed Volitional Cough: Cognitively unable to elicit Volitional  Swallow: Unable to elicit    Oral/Motor/Sensory Function Overall Oral Motor/Sensory Function: Other (comment)(DTA d/t LOA)   Ice Chips Ice chips: Impaired Presentation: Spoon Oral Phase Impairments: Reduced lingual movement/coordination Oral Phase Functional Implications: Prolonged oral transit;Oral holding Pharyngeal Phase Impairments: Suspected delayed Swallow;Multiple swallows   Thin Liquid Thin Liquid: Impaired Presentation: Straw Oral Phase Impairments: Reduced lingual movement/coordination;Poor awareness of bolus Oral Phase Functional Implications: Prolonged oral transit;Oral holding Pharyngeal  Phase Impairments: Suspected delayed Swallow;Multiple swallows    Nectar Thick Nectar Thick Liquid: Not tested   Honey Thick Honey Thick Liquid: Not tested   Puree Puree: Impaired Presentation: Spoon Oral Phase Impairments: Reduced lingual movement/coordination Oral Phase Functional Implications: Prolonged oral transit;Oral holding Pharyngeal Phase Impairments: Suspected delayed Swallow;Multiple swallows   Solid      Solid: Not tested        Tressie StalkerPat Adams, M.S., CCC-SLP 01/28/2018,2:45 PM

## 2018-01-28 NOTE — Progress Notes (Addendum)
PROGRESS NOTE  Chad DerrickJohn Everett UJW:[id number] DOB: 1931/07/16 DOA: 01/27/2018 [facility]  Brief History:  84 year old male with a history of dementia, hyperlipidemia, hypertension, COPD presenting from [name] secondary to lethargy and abnormal lab work.  Apparently, the patient has had decreased oral intake and altered level of consciousness.  Urinalysis at the nursing facility performed on 01/27/2018 showed TNTC WBC.  Patient was given an intramuscular dose of ceftriaxone prior to arrival to the emergency department.  The patient is unable to provide any history secondary to his dementia and encephalopathy.  CBC at the nursing home also showed a WBC of 25.3.  The patient was afebrile hemodynamically stable since admission.  Lab work showed serum sodium 169 and WBC 20.6.  The patient was started on vancomycin and cefepime and D5W.  Assessment/Plan: Sepsis -Present at the time of admission -Secondary to urinary source -Lactic acid peaked 3.0 -Procalcitonin 0.50 -Continue IV fluids -Continue IV antibiotics pending culture data  UTI -Culture will likely be unrevealing as the patient received antibiotics prior to arrival -Continue vancomycin and cefepime for now   AKI -Secondary to volume depletion -Baseline creatinine 0.5-0.7 -Presented with serum creatinine 1.52 -Improving with IV fluids  Hypernatremia -Continue D5W  Acute metabolic encephalopathy -The patient is bedbound at baseline with poor functional status -currently noncommunicative  Pulmonary opacity -Clinically does not have pneumonia as the patient has no tachypnea or hypoxia -Monitor clinically  Hyperglycemia -Check hemoglobin A1c -NovoLog sliding scale  Dementia with behavioral disturbance -Haldol as needed agitation  Hyperlipidemia -Restart statin once able to tolerate po  Hypokalemia -Replete -Check magnesium  GOC -pt has guardian -consult palliative  medicine -given the patient's critical illness with  debilitation and poor baseline level of function in the setting of the patient's dementia, the patient's overall prognosis is poor.  Given the incurability of the patient's underlying chronic medical conditions including but not limited to his dementia, COPD, and debilitation, the patient is at high risk of recurrent hospitalizations with high risk of morbidity and mortality.  As a result, I would strongly recommend his code status be changed to Do Not Resuscitate.    Disposition Plan:   SNF in 2-3 days  Family Communication:  No Family at bedside  Consultants:  Palliative medicine  Code Status:  FULL  DVT Prophylaxis:   Lovenox   Procedures: As Listed in Progress Note Above  Antibiotics: vanco 6/19>>> Cefepime 6/19>>>    Subjective: Patient is currently noncommunicative.  He opens his eyes to verbal stimuli but does not follow commands.  There is no reports of vomiting, respiratory distress, uncontrolled pain, diarrhea.  Objective: Vitals:   01/28/18 0500 01/28/18 0605 01/28/18 0606 01/28/18 0700  BP: (!) 98/45 110/77 110/77 (!) 97/49  Pulse:      Resp: 17 13 15 20   Temp:      TempSrc:      SpO2:      Weight:      Height:        Intake/Output Summary (Last 24 hours) at 01/28/2018 0752 Last data filed at 01/28/2018 [id number] Gross per 24 hour  Intake 3861.67 ml  Output 75 ml  Net 3786.67 ml   Weight change:  Exam:   General:  Pt is alert, does not follow commands appropriately, not in acute distress  HEENT: No icterus, No thrush, No neck mass, [place]/AT  Cardiovascular: RRR, S1/S2, no rubs, no gallops  Respiratory: Poor respiratory  effort but clear to auscultation.  No wheezing.  Good air movement.  Abdomen: Soft/+BS, non tender, non distended, no guarding  Extremities: No edema, No lymphangitis, No petechiae, No rashes, no synovitis   Data Reviewed: I have personally reviewed following labs and imaging  studies Basic Metabolic Panel: Recent Labs  Lab 01/27/18 2006 01/28/18 0055 01/28/18 0405  NA 169* 168* 161*  K 2.8* 3.3* 3.3*  CL >130* >130* 128*  CO2 29 26 27   GLUCOSE 132* 234* 347*  BUN 83* 77* 69*  CREATININE 1.52* 1.36* 1.33*  CALCIUM 7.8* 7.8* 7.5*  MG 2.8*  --   --   PHOS 4.2  --   --    Liver Function Tests: Recent Labs  Lab 01/27/18 2006  AST 54*  ALT 39  ALKPHOS 90  BILITOT 0.8  PROT 7.2  ALBUMIN 2.4*   No results for input(s): LIPASE, AMYLASE in the last 168 hours. No results for input(s): AMMONIA in the last 168 hours. Coagulation Profile: Recent Labs  Lab 01/27/18 2006  INR 1.37   CBC: Recent Labs  Lab 01/27/18 2006 01/28/18 0405  WBC 19.7* 20.6*  NEUTROABS 17.1 17.7*  HGB 12.8* 11.9*  HCT 42.2 39.8  MCV 103.2* 104.7*  PLT 313 285   Cardiac Enzymes: No results for input(s): CKTOTAL, CKMB, CKMBINDEX, TROPONINI in the last 168 hours. BNP: Invalid input(s): POCBNP CBG: Recent Labs  Lab 01/27/18 1746  GLUCAP 141*   HbA1C: No results for input(s): HGBA1C in the last 72 hours. Urine analysis:    Component Value Date/Time   COLORURINE YELLOW 10/22/2014 0932   APPEARANCEUR CLEAR 10/22/2014 0932   APPEARANCEUR Hazy 02/13/2012 1956   LABSPEC 1.015 10/22/2014 0932   LABSPEC 1.016 02/13/2012 1956   PHURINE 7.0 10/22/2014 0932   GLUCOSEU NEGATIVE 10/22/2014 0932   GLUCOSEU 150 mg/dL 16/05/9603 5409   HGBUR TRACE (A) 10/22/2014 0932   BILIRUBINUR NEGATIVE 10/22/2014 0932   BILIRUBINUR Negative 02/13/2012 1956   KETONESUR NEGATIVE 10/22/2014 0932   PROTEINUR 30 (A) 10/22/2014 0932   UROBILINOGEN 0.2 10/22/2014 0932   NITRITE NEGATIVE 10/22/2014 0932   LEUKOCYTESUR NEGATIVE 10/22/2014 0932   LEUKOCYTESUR Negative 02/13/2012 1956   Sepsis Labs: @LABRCNTIP (procalcitonin:4,lacticidven:4) ) Recent Results (from the past 240 hour(s))  MRSA PCR Screening     Status: Abnormal   Collection Time: 01/27/18 10:39 PM  Result Value Ref Range  Status   MRSA by PCR POSITIVE (A) NEGATIVE Final    Comment:        The GeneXpert MRSA Assay (FDA approved for NASAL specimens only), is one component of a comprehensive MRSA colonization surveillance program. It is not intended to diagnose MRSA infection nor to guide or monitor treatment for MRSA infections. RESULT CALLED TO, READ BACK BY AND VERIFIED WITH: KOGER L. AT 0738A ON [id number] BY THOMPSON S. Performed at [facility], [address] [place], [place] [address]      Scheduled Meds: . enoxaparin (LOVENOX) injection  30 mg Subcutaneous Q24H  . insulin aspart  0-20 Units Subcutaneous Q4H   Continuous Infusions: . ceFEPime (MAXIPIME) IV    . dextrose    . vancomycin      Procedures/Studies: Dg Chest Port 1 View  Result Date: 01/27/2018 CLINICAL DATA:  Code sepsis.  History of dementia. EXAM: PORTABLE CHEST 1 VIEW COMPARISON:  Chest radiograph November 18, 2017 FINDINGS: Limited by patient positioning. Cardiomediastinal silhouette is normal. Calcified aortic knob. Patchy RIGHT upper lobe airspace opacity. Mild chronic interstitial changes. No pleural effusion.  No pneumothorax. Osteopenia. IMPRESSION: Patchy RIGHT upper lobe airspace opacity may be artifact, atelectasis or pneumonia. Aortic Atherosclerosis (ICD10-I70.0). Electronically Signed   By: Awilda Metro M.D.   On: 01/27/2018 19:58    Catarina Hartshorn, DO  [facility] Pager [phone]  If 7PM-7AM, please contact night-coverage [email or web address] Password [email or web address] 01/28/2018, 7:52 AM   LOS: 1 day

## 2018-01-28 NOTE — Progress Notes (Signed)
CRITICAL VALUE ALERT  Critical Value: Na+ 161 Date & Time Notied:  01/28/18 0515  Provider Notified: Sherryll BurgerShah  Orders Received/Actions taken: no orders at this time

## 2018-01-29 LAB — GLUCOSE, CAPILLARY
GLUCOSE-CAPILLARY: 112 mg/dL — AB (ref 65–99)
GLUCOSE-CAPILLARY: 115 mg/dL — AB (ref 65–99)
GLUCOSE-CAPILLARY: 143 mg/dL — AB (ref 65–99)
Glucose-Capillary: 113 mg/dL — ABNORMAL HIGH (ref 65–99)
Glucose-Capillary: 122 mg/dL — ABNORMAL HIGH (ref 65–99)
Glucose-Capillary: 130 mg/dL — ABNORMAL HIGH (ref 65–99)
Glucose-Capillary: 138 mg/dL — ABNORMAL HIGH (ref 65–99)

## 2018-01-29 LAB — BASIC METABOLIC PANEL
Anion gap: 6 (ref 5–15)
BUN: 40 mg/dL — AB (ref 6–20)
CHLORIDE: 124 mmol/L — AB (ref 101–111)
CO2: 25 mmol/L (ref 22–32)
CREATININE: 0.97 mg/dL (ref 0.61–1.24)
Calcium: 7.7 mg/dL — ABNORMAL LOW (ref 8.9–10.3)
GFR calc Af Amer: >60 mL/min (ref >60)
GFR calc non Af Amer: >60 mL/min (ref >60)
GLUCOSE: 153 mg/dL — AB (ref 65–99)
POTASSIUM: 3.6 mmol/L (ref 3.5–5.1)
SODIUM: 155 mmol/L — AB (ref 135–145)

## 2018-01-29 LAB — CBC
HCT: 36.6 % — ABNORMAL LOW (ref 39.0–52.0)
Hemoglobin: 11.1 g/dL — ABNORMAL LOW (ref 13.0–17.0)
MCH: 30.7 pg (ref 26.0–34.0)
MCHC: 30.3 g/dL (ref 30.0–36.0)
MCV: 101.4 fL — ABNORMAL HIGH (ref 78.0–100.0)
Platelets: 221 10*3/uL (ref 150–400)
RBC: 3.61 MIL/uL — ABNORMAL LOW (ref 4.22–5.81)
RDW: 15.1 % (ref 11.5–15.5)
WBC: 15.6 10*3/uL — ABNORMAL HIGH (ref 4.0–10.5)

## 2018-01-29 LAB — URINE CULTURE
Culture: NO GROWTH
SPECIAL REQUESTS: NORMAL

## 2018-01-29 LAB — LACTIC ACID, PLASMA: LACTIC ACID, VENOUS: 1.3 mmol/L (ref 0.5–1.9)

## 2018-01-29 LAB — MAGNESIUM: MAGNESIUM: 2.3 mg/dL (ref 1.7–2.4)

## 2018-01-29 MED ORDER — BISACODYL 10 MG RE SUPP
10.0000 mg | Freq: Every day | RECTAL | Status: DC | PRN
  Administered 2018-01-29: 10 mg via RECTAL
  Filled 2018-01-29: qty 1

## 2018-01-29 MED ORDER — ORAL CARE MOUTH RINSE
15.0000 mL | Freq: Two times a day (BID) | OROMUCOSAL | Status: DC
  Administered 2018-01-29 – 2018-02-01 (×6): 15 mL via OROMUCOSAL

## 2018-01-29 NOTE — Clinical Social Work Note (Signed)
Clinical Social Work Assessment  Patient Details  Name: Chad Everett MRN: [id number] Date of Birth: 1931/05/21  Date of referral:  01/29/18               Reason for consult:  Discharge Planning                Permission sought to share information with:    Permission granted to share information::     Name::        Agency::     Relationship::     Contact Information:     Housing/Transportation Living arrangements for the past 2 months:  Skilled Nursing Facility Source of Information:  Facility Patient Interpreter Needed:  None Criminal Activity/Legal Involvement Pertinent to Current Situation/Hospitalization:  No - Comment as needed Significant Relationships:  None Lives with:  Facility Resident Do you feel safe going back to the place where you live?  Yes Need for family participation in patient care:  No (Coment)  Care giving concerns: [name]   Social Worker assessment / plan: Pt is an 84 year old male referred to CSW for dc planning. [name]. [place] has dementia and [name] [place]. Pt has no family support. He is limited assist for feeding and total care for all other ADLs. Plan is for return to [name] at dc. Will follow and assist as needed.  Employment status:  Retired Health and safety inspectornsurance information:  Armed forces operational officerMedicare, [place] Recommend[place]:  Not assessed at this time Information / Referral to community resources:  Skilled Nursing Facility  Patient/Family's Response to care: Pt accepting of care.  Patient/Family's Understanding of and Emotional Response to Diagnosis, Current Treatment, and Prognosis: Pt unable to understand diagnosis and treatment recommendations. All information is given to pt's guardian. No emotional distress identified.  Emotional Assessment Appearance:  Appears stated age Attitude/Demeanor/Rapport:  Unable to Assess Affect (typically observed):  Unable to Assess Orientation:  Oriented to Self Alcohol /  Substance use:  Not Applicable Psych involvement (Current and /or in the community):  No (Comment)  Discharge Needs  Concerns to be addressed:  Discharge Planning Concerns Readmission within the last 30 days:  No Current discharge risk:  None Barriers to Discharge:  No Barriers Identified   Elliot GaultKathleen Satine Hausner, LCSW 01/29/2018, 1:58 PM

## 2018-01-29 NOTE — Care Management Important Message (Signed)
Important Message  Patient Details  Name: Chad Everett MRN: [id number] Date of Birth: 1931-02-15   Medicare Important Message Given:  Yes    [name], [name] 01/29/2018, 12:15 PM

## 2018-01-29 NOTE — Progress Notes (Signed)
  Speech Language Pathology Treatment: Dysphagia  Patient Details Name: Chad Everett MRN: [id number] DOB: May 21, 1931 Today's Date: 01/29/2018 Time: [id number] SLP Time Calculation (min) (ACUTE ONLY): 15 min  Assessment / Plan / Recommendation Clinical Impression  Dysphagia treatment provided to check for PO readiness. Unfortunately pt continues to present with AMS and very difficult to arouse at this time except with sternal rub/ repositioning. During these times he maintained alertness for only a few seconds; when POs were attempted (ice chips, thin liquids), boluses spilled back out of mouth. Recommend continuing NPO with meds via alternative means and frequent oral care. Will continue to follow for PO readiness which will be dependent on improvements in alertness/ mentation.   HPI HPI: 84 y.o. male with medical history significant of anxiety, asthma, dementia, GERD, hyperlipidemia, hypertension, episodes of pneumonia who [facility] due to abnormal labs and altered level consciousness.  Labs done at the facility showed leukocytosis of more than  25,000 with significant pyuria on urinalysis      SLP Plan  Continue with current plan of care       Recommendations  Diet recommendations: NPO Medication Administration: Via alternative means                Oral Care Recommendations: Oral care QID Follow up Recommendations: Skilled Nursing facility SLP Visit Diagnosis: Dysphagia, oropharyngeal phase (R13.12) Plan: Continue with current plan of care       GO                Metro KungAmy K Shashwat Cleary, MA, CCC-SLP 01/29/2018, 9:23 AM

## 2018-01-29 NOTE — Progress Notes (Signed)
Initial Nutrition Assessment  DOCUMENTATION CODES:  Severe malnutrition in context of chronic illness, Underweight  INTERVENTION:  If/when diet advanced, will order appropriate oral supplements depending on ST recs.   Agree patient is not appropriate for PEG tube.   NUTRITION DIAGNOSIS:  Severe Malnutrition related to chronic illness(Dementia) as evidenced by  Severe muscle/fat loss and a loss of >8% bw in <3 months  GOAL:  Patient will meet greater than or equal to 90% of their needs  MONITOR:  Diet advancement, Labs, Weight trends, I & O's(GOC)  REASON FOR ASSESSMENT:  Malnutrition Screening Tool    ASSESSMENT:  84 y/o male PMHx Dementia, HLD/HTN, COPD, HF, GERD. Presents from SNF due to abnormal labs and AMS. Work up revealed sepsis 2/2 to UTI and admitted to SDU for management.     Palliative NP is following along for GOC discussion/disposition.  There has been no oral intake since admission due to NPO status.   Per chart, pt was weighed in April at 121.7 lbs at hospitalization in April after suffering a R hip fx s/p internal fixation 4/11. He actually presented this admission at 101 lbs 10 oz, but was significantly volume depleted. He has received IVF and is now at 110 lbs. Even with use of rehydrated weight, he has lost a clinically significant ~8.3% of body weight in <3 months time.   Physical Exam: Majority of body displays Severe muscle/fat wasting. No edema. See below  Pt is current NPO after SLP noted patient at moderate aspiration risk. Unable to provide any nutritional oral interventions at this time. Agree with palliative NP that PEG would not be appropriate in end-stage dementia, especially given pt has apparent tendency to pull lines/tubes.  Per Palliative NP, POA wants to give the patient the weekend to show any significant improvements.   Labs: BG 115-155, WBC:15.6-improving, Albumin: 2.4, BUN/Creat: 1.52 on admit, now 1.33. Na 169 on admit, now 155 Meds:  Insulin, iv abx, iv abx  Recent Labs  Lab 01/27/18 2006 01/28/18 0055 01/28/18 0405 01/29/18 0550  NA 169* 168* 161* 155*  K 2.8* 3.3* 3.3* 3.6  CL >130* >130* 128* 124*  CO2 29 26 27 25   BUN 83* 77* 69* 40*  CREATININE 1.52* 1.36* 1.33* 0.97  CALCIUM 7.8* 7.8* 7.5* 7.7*  MG 2.8*  --   --  2.3  PHOS 4.2  --   --   --   GLUCOSE 132* 234* 347* 153*   NUTRITION - FOCUSED PHYSICAL EXAM:   Most Recent Value  Orbital Region  Mild depletion  Upper Arm Region  Severe depletion  Thoracic and Lumbar Region  Severe depletion  Buccal Region  No depletion  Temple Region  Mild depletion  Clavicle Bone Region  Moderate depletion  Clavicle and Acromion Bone Region  Moderate depletion  Scapular Bone Region  Severe depletion  Dorsal Hand  Unable to assess  Patellar Region  Unable to assess  Anterior Thigh Region  Severe depletion  Posterior Calf Region  Severe depletion  Edema (RD Assessment)  None  Hair  Reviewed  Eyes  Unable to assess  Mouth  Unable to assess  Skin  Reviewed  Nails  Unable to assess     Diet Order:   Diet Order    None     EDUCATION NEEDS:  No education needs have been identified at this time  Skin: Abrasion to face  Last BM:  6/21  Height:  Ht Readings from Last 1 Encounters:  01/27/18 5'  6" (1.676 m)   Weight:  Wt Readings from Last 1 Encounters:  01/29/18 110 lb 10.7 oz (50.2 kg)   Wt Readings from Last 10 Encounters:  01/29/18 110 lb 10.7 oz (50.2 kg)  11/19/17 121 lb 11.1 oz (55.2 kg)  10/21/14 143 lb 14.4 oz (65.3 kg)  10/23/14 143 lb (64.9 kg)  08/15/14 139 lb 3.2 oz (63.1 kg)  05/18/13 146 lb 2.6 oz (66.3 kg)  02/12/13 155 lb (70.3 kg)   Ideal Body Weight:  64.55 kg  BMI:  Body mass index is 17.86 kg/m.  Estimated Nutritional Needs:  Kcal:  1600-1800 (32-36 kcal/kg bw) Protein:  70-80g Pro (1.4-1.6g/kg bw) Fluid:  >1.25 L (25 ml/kg)  Christophe Louis RD, LDN, CNSC Clinical Nutrition Available Tues-Sat via Pager:  [phone] 01/29/2018 3:16 PM

## 2018-01-29 NOTE — Progress Notes (Signed)
PROGRESS NOTE  Chad Everett ZOX:[id number] DOB: 11-19-1930 DOA: 01/27/2018 PCP: [facility]  Brief History:  84 year old male with a history of dementia, hyperlipidemia, hypertension, COPD presenting from [name] secondary to lethargy and abnormal lab work.  Apparently, the patient has had decreased oral intake and altered level of consciousness.  Urinalysis at the nursing facility performed on 01/27/2018 showed TNTC WBC.  Patient was given an intramuscular dose of ceftriaxone prior to arrival to the emergency department.  The patient is unable to provide any history secondary to his dementia and encephalopathy.  CBC at the nursing home also showed a WBC of 25.3.  The patient was afebrile hemodynamically stable since admission.  Lab work showed serum sodium 169 and WBC 20.6.  The patient was started on vancomycin and cefepime and D5W.  Assessment/Plan: Sepsis -Present at the time of admission -Secondary to urinary source -Lactic acid peaked 3.0>>>1.2 -Procalcitonin 0.50 -Continue IV fluids -Continue IV antibiotics pending culture data -spiked fever 101.8 F last evening  UTI -Culture will likely be unrevealing as the patient received antibiotics prior to arrival -Continue vancomycin and cefepime for now   AKI -Secondary to volume depletion -Baseline creatinine 0.5-0.7 -Presented with serum creatinine 1.52 -Improving with IV fluids  Hypernatremia -Continue D5W-->improving but remains significantly hypernatremic -pt has essentially zero oral intake  Acute metabolic encephalopathy -The patient is bedbound at baseline with poor functional status -remains essentially noncommunicative -currently not safe for po intake -appreciate speech therapy-->remain npo  Pulmonary opacity -Clinically does not have pneumonia as the patient has no tachypnea or hypoxia -Monitor clinically  Hyperglycemia -Check hemoglobin A1c -NovoLog sliding  scale  Dementia with behavioral disturbance -Haldol as needed agitation  Hyperlipidemia -Restart statin if able to tolerate po  Hypokalemia -Repleted -Check magnesium--2.3  GOC -pt has guardian -consult palliative medicine -given the patient's critical illness with  debilitation and poor baseline level of function in the setting of the patient's dementia, the patient's overall prognosis is poor.  Given the incurability of the patient's underlying chronic medical conditions including but not limited to his dementia, COPD, and debilitation, the patient is at high risk of recurrent hospitalizations with high risk of morbidity and mortality.  As a result, I would strongly recommend his code status be changed to Do Not Resuscitate. -Given the patient's debilitation, encephalopathy, and no oral intake, the patient is at high risk for repeat hospitalization with morbidity and mortality. -case discussed with palliative medicine    Disposition Plan:   SNF vs residential hospice 5/24 Family Communication:  No Family at bedside  Consultants:  Palliative medicine  Code Status:  DNR  DVT Prophylaxis:  [place] Lovenox   Procedures: As Listed in Progress Note Above  Antibiotics: vanco 6/19>>> Cefepime 6/19>>>        Subjective: Patient remains essentially uncommunicative.  He opens his eyes, but does not make any eye contact with verbal stimuli.  The patient withdraws to protopathic stimuli.  There are no reports of vomiting, diarrhea, uncontrolled pain, respiratory distress.  Review systems not possible secondary to patient's encephalopathy.  Objective: Vitals:   01/29/18 0400 01/29/18 0500 01/29/18 0600 01/29/18 0800  BP: (!) 127/59   (!) 136/113  Pulse: 82 77 78 77  Resp: (!) 39 (!) 21 19 (!) 23  Temp: 98.2 F (36.8 C)   97.9 F (36.6 C)  TempSrc: Axillary   Axillary  SpO2: 99% 96% 98% 99%  Weight:  50.2  kg (110 lb 10.7 oz)    Height:        Intake/Output  Summary (Last 24 hours) at 01/29/2018 1153 Last data filed at 01/29/2018 1100 Gross per 24 hour  Intake 4131.67 ml  Output 450 ml  Net 3681.67 ml   Weight change: -4.685 kg (-10 lb 5.3 oz) Exam:   General:  Pt is alert, does not follow commands appropriately, not in acute distress  HEENT: No icterus, No thrush, No neck mass, [place]/AT  Cardiovascular: RRR, S1/S2, no rubs, no gallops  Respiratory: Diminished breath sounds.  Bibasilar rales.  No wheezing.  Good air movement.  Abdomen: Soft/+BS, non tender, non distended  Extremities: No edema, No lymphangitis, No petechiae, No rashes, no synovitis   Data Reviewed: I have personally reviewed following labs and imaging studies Basic Metabolic Panel: Recent Labs  Lab 01/27/18 2006 01/28/18 0055 01/28/18 0405 01/29/18 0550  NA 169* 168* 161* 155*  K 2.8* 3.3* 3.3* 3.6  CL >130* >130* 128* 124*  CO2 29 26 27 25   GLUCOSE 132* 234* 347* 153*  BUN 83* 77* 69* 40*  CREATININE 1.52* 1.36* 1.33* 0.97  CALCIUM 7.8* 7.8* 7.5* 7.7*  MG 2.8*  --   --  2.3  PHOS 4.2  --   --   --    Liver Function Tests: Recent Labs  Lab 01/27/18 2006  AST 54*  ALT 39  ALKPHOS 90  BILITOT 0.8  PROT 7.2  ALBUMIN 2.4*   No results for input(s): LIPASE, AMYLASE in the last 168 hours. No results for input(s): AMMONIA in the last 168 hours. Coagulation Profile: Recent Labs  Lab 01/27/18 2006  INR 1.37   CBC: Recent Labs  Lab 01/27/18 2006 01/28/18 0405 01/29/18 0550  WBC 19.7* 20.6* 15.6*  NEUTROABS 17.1 17.7*  --   HGB 12.8* 11.9* 11.1*  HCT 42.2 39.8 36.6*  MCV 103.2* 104.7* 101.4*  PLT 313 285 221   Cardiac Enzymes: No results for input(s): CKTOTAL, CKMB, CKMBINDEX, TROPONINI in the last 168 hours. BNP: Invalid input(s): POCBNP CBG: Recent Labs  Lab 01/28/18 1609 01/28/18 2005 01/28/18 2356 01/29/18 0513 01/29/18 0739  GLUCAP 141* 121* 112* 138* 115*   HbA1C: No results for input(s): HGBA1C in the last 72 hours. Urine  analysis:    Component Value Date/Time   COLORURINE YELLOW 10/22/2014 0932   APPEARANCEUR CLEAR 10/22/2014 0932   APPEARANCEUR Hazy 02/13/2012 1956   LABSPEC 1.015 10/22/2014 0932   LABSPEC 1.016 02/13/2012 1956   PHURINE 7.0 10/22/2014 0932   GLUCOSEU NEGATIVE 10/22/2014 0932   GLUCOSEU 150 mg/dL 16/10/960407/12/2011 [id number]   HGBUR TRACE (A) 10/22/2014 0932   BILIRUBINUR NEGATIVE 10/22/2014 0932   BILIRUBINUR Negative 02/13/2012 1956   KETONESUR NEGATIVE 10/22/2014 0932   PROTEINUR 30 (A) 10/22/2014 0932   UROBILINOGEN 0.2 10/22/2014 0932   NITRITE NEGATIVE 10/22/2014 0932   LEUKOCYTESUR NEGATIVE 10/22/2014 0932   LEUKOCYTESUR Negative 02/13/2012 1956   Sepsis Labs: @LABRCNTIP (procalcitonin:4,lacticidven:4) ) Recent Results (from the past 240 hour(s))  MRSA PCR Screening     Status: Abnormal   Collection Time: 01/27/18 10:39 PM  Result Value Ref Range Status   MRSA by PCR POSITIVE (A) NEGATIVE Final    Comment:        The GeneXpert MRSA Assay (FDA approved for NASAL specimens only), is one component of a comprehensive MRSA colonization surveillance program. It is not intended to diagnose MRSA infection nor to guide or monitor treatment for MRSA infections. RESULT CALLED TO, READ BACK  BY AND VERIFIED WITH: KOGER L. AT 0738A ON [id number] BY THOMPSON S. Performed at [facility], [address], [place], [place] [address]   Culture, Urine     Status: None   Collection Time: 01/28/18  5:25 AM  Result Value Ref Range Status   Specimen Description   Final    URINE, CLEAN CATCH Performed at [facility], [address], [place], [place] [address]    Special Requests   Final    Normal Performed at [facility], [address], [place], [place] [address]    Culture   Final    NO GROWTH Performed at [facility], [address], [place], [place] [address]    Report Status 01/29/2018 FINAL  Final     Scheduled Meds: . Chlorhexidine Gluconate Cloth  6 each Topical Q0600  .  enoxaparin (LOVENOX) injection  30 mg Subcutaneous Q24H  . insulin aspart  0-9 Units Subcutaneous Q4H  . mouth rinse  15 mL Mouth Rinse BID  . mupirocin ointment  1 application Nasal BID   Continuous Infusions: . ceFEPime (MAXIPIME) IV 1 g (01/28/18 1739)  . dextrose 5 % with KCl 20 mEq / L 20 mEq (01/28/18 2012)  . vancomycin Stopped (01/28/18 2215)    Procedures/Studies: Dg Chest Port 1 View  Result Date: 01/27/2018 CLINICAL DATA:  Code sepsis.  History of dementia. EXAM: PORTABLE CHEST 1 VIEW COMPARISON:  Chest radiograph November 18, 2017 FINDINGS: Limited by patient positioning. Cardiomediastinal silhouette is normal. Calcified aortic knob. Patchy RIGHT upper lobe airspace opacity. Mild chronic interstitial changes. No pleural effusion. No pneumothorax. Osteopenia. IMPRESSION: Patchy RIGHT upper lobe airspace opacity may be artifact, atelectasis or pneumonia. Aortic Atherosclerosis (ICD10-I70.0). Electronically Signed   By: Awilda Metro M.D.   On: 01/27/2018 19:58    Catarina Hartshorn, DO  [facility] Pager [phone]  If 7PM-7AM, please contact night-coverage [email or web address] Password [facility] 01/29/2018, 11:53 AM   LOS: 2 days

## 2018-01-29 NOTE — Progress Notes (Addendum)
Palliative:  [name] is resting quietly in bed.  He appears calm and relatively comfortable.  He will briefly make but not keep eye contact, closing his eyes.  He does not respond verbally to any of my questions today, but nursing staff tells me that he has stated things like "leave me alone".  Call from DSS social worker, Weldon InchesLucinda Wilson.  She states that she has received information related to CODE STATUS, she and supervisor discussed, they elect "allow a natural death" (DNR).  Glee ArvinLucinda states that she has discussed this also with Mr. Nicoletta Dresserry's grand daughter (who has no decision-making capability) and she is in agreement.  Epic chart noted, goldenrod form completed.  We talked about Mr. Ramo's current state, some mild improvements in mental state and labs.  We talked about meaningful improvement.  I share that I am still concerned by Mr. Pippen's slow turnaround, high sodium and high white blood cells.  Ms. Andrey CampanileWilson states that they would like to continue current treatment plan through the weekend, and discussed what is next on Monday. We talk about whats next.  I share that [name] will get sick again, whether it's two weeks or two months, and encourage her to start thinking about how we will care for him then.   We talked about Mr. Peddie failing swallow study, nothing by mouth at this point.  I shared that speech therapy will continue to evaluate, he will have a diet as soon as it is safe for him to eat.  I share that he continues to receive IV fluids.  We talked about artificial nutrition.  I share that it is NOT likely that [name] would qualify for artificial nutrition (PEG tube) as this would not change any of his chronic health problems including dementia.  I share also that nursing staff has noted [name] to be pulling at his tubes and drains, my concern is that he would pull out PEG tube.  Ms. Andrey CampanileWilson states that her care plan meeting at residential SNF on 5/10 showed Mr. Nicoletta Dresserry's weight at 117  pounds.  She asks how long it would take for him to lose this much weight.  I shared that I am concerned that his desire to eat has probably been low for several weeks.  We talked about his injury, hip fracture, in April, and being placed in a facility.  I shared that these changes can often cause sharp declines in people with dementia.  All questions answered.  We plan for follow-up call on Monday.  Ms. Andrey CampanileWilson states that her on-call team is available this weekend if needed, they will get in touch with her.  She states it is best to reach her by calling the main number [phone] and then her extension [phone].  40 minutes Lillia Carmelasha Margerite Impastato, NP Palliative Medicine Team Team Phone # [phone]

## 2018-01-29 NOTE — [place] FL2 (Signed)
[place] MEDICAID FL2 LEVEL OF CARE SCREENING TOOL     IDENTIFICATION  Patient Name: Chad Everett Birthdate: [birth year] Sex: male Admission Date (Current Location): 01/27/2018  [facility] [place]   Facility and Address:  [facility],  [address], [place] [id number]      Provider Number: [id number]  Attending Physician Name and Address:  Catarina Hartshornat, David, MD  Relative Name and Phone Number:       Current Level of Care: Hospital Recommended Level of Care: Skilled Nursing Facility Prior Approval Number:    Date Approved/Denied:   PASRR Number:    Discharge Plan: SNF    Current Diagnoses: Patient Active Problem List   Diagnosis Date Noted  . Dehydration   . Urinary tract infection without hematuria   . Goals of care, counseling/discussion   . Palliative care by specialist   . DNR (do not resuscitate) discussion   . Sepsis secondary to UTI (HCC) 01/27/2018  . Hypernatremia 01/27/2018  . Lactic acidosis 01/27/2018  . S/P ORIF (open reduction internal fixation) fracture right hip cannulated screw placement 11/19/17 12/03/2017  . Pressure injury of skin 11/21/2017  . Malnutrition of moderate degree 11/19/2017  . Closed right hip fracture, initial encounter (HCC) 11/18/2017  . Hyperlipidemia 11/18/2017  . Encephalopathy 10/22/2014  . Acute dyspnea 10/21/2014  . HCAP (healthcare-associated pneumonia) 10/21/2014  . Diastolic heart failure (HCC) 10/21/2014  . Aspiration pneumonia (HCC) 08/13/2014  . COPD (chronic obstructive pulmonary disease) (HCC) 08/13/2014  . Acute respiratory failure with hypoxia (HCC) 08/13/2014  . Hypokalemia 08/13/2014  . Chronic diastolic congestive heart failure (HCC) 08/13/2014  . Weakness 08/11/2014  . Near syncope 05/17/2013  . Syncope 05/17/2013  . Dementia   . GERD (gastroesophageal reflux disease)   . Anxiety     Orientation RESPIRATION BLADDER Height & Weight     Self  Normal Incontinent Weight: 110  lb 10.7 oz (50.2 kg) Height:  5\' 6"  (167.6 cm)  BEHAVIORAL SYMPTOMS/MOOD NEUROLOGICAL BOWEL NUTRITION STATUS      Incontinent Diet(see dc summary)  AMBULATORY STATUS COMMUNICATION OF NEEDS Skin   Extensive Assist Does not communicate Normal                       Personal Care Assistance Level of Assistance  Bathing, Feeding, Dressing Bathing Assistance: Maximum assistance Feeding assistance: Limited assistance Dressing Assistance: Maximum assistance     Functional Limitations Info  Sight, Hearing, Speech Sight Info: Adequate Hearing Info: Adequate Speech Info: Impaired    SPECIAL CARE FACTORS FREQUENCY                       Contractures Contractures Info: Not present    Additional Factors Info  Code Status, Allergies, Psychotropic Code Status Info: DNR Allergies Info: onion           Current Medications (01/29/2018):  This is the current hospital active medication list Current Facility-Administered Medications  Medication Dose Route Frequency Provider Last Rate Last Dose  . acetaminophen (TYLENOL) tablet 650 mg  650 mg Oral Q6H PRN Bobette Mortiz, David Manuel, MD       Or  . acetaminophen (TYLENOL) suppository 650 mg  650 mg Rectal Q6H PRN Bobette Mortiz, David Manuel, MD   650 mg at 01/28/18 1614  . ceFEPIme (MAXIPIME) 1 g in sodium chloride 0.9 % 100 mL IVPB  1 g Intravenous Q24H Bobette Mortiz, David Manuel, MD 200 mL/hr at 01/28/18 1739 1 g at 01/28/18  1739  . Chlorhexidine Gluconate Cloth 2 % PADS 6 each  6 each Topical Q0600 Tat, Onalee Hua, MD   6 each at 01/29/18 0559  . dextrose 5 % with KCl 20 mEq / L  infusion  20 mEq Intravenous Continuous Tat, David, MD 100 mL/hr at 01/28/18 2012 20 mEq at 01/28/18 2012  . enoxaparin (LOVENOX) injection 30 mg  30 mg Subcutaneous Q24H Bobette Mo, MD   30 mg at 01/28/18 2111  . insulin aspart (novoLOG) injection 0-9 Units  0-9 Units Subcutaneous Q4H Catarina Hartshorn, MD   1 Units at 01/29/18 1149  . MEDLINE mouth rinse  15 mL Mouth Rinse BID  Tat, David, MD   15 mL at 01/29/18 0936  . mupirocin ointment (BACTROBAN) 2 % 1 application  1 application Nasal BID Catarina Hartshorn, MD   1 application at 01/29/18 0936  . ondansetron (ZOFRAN) tablet 4 mg  4 mg Oral Q6H PRN Bobette Mo, MD       Or  . ondansetron [facility]) injection 4 mg  4 mg Intravenous Q6H PRN Bobette Mo, MD      . vancomycin [facility]) 500 mg in sodium chloride 0.9 % 100 mL IVPB  500 mg Intravenous Q24H Bobette Mo, MD   Stopped at 01/28/18 2215     Discharge Medications: Please see discharge summary for a list of discharge medications.  Relevant Imaging Results:  Relevant Lab Results:   Additional Information    Elliot Gault, LCSW

## 2018-01-30 DIAGNOSIS — E43 Unspecified severe protein-calorie malnutrition: Secondary | ICD-10-CM

## 2018-01-30 LAB — BASIC METABOLIC PANEL
Anion gap: 8 (ref 5–15)
BUN: 23 mg/dL — ABNORMAL HIGH (ref 6–20)
CHLORIDE: 121 mmol/L — AB (ref 101–111)
CO2: 23 mmol/L (ref 22–32)
CREATININE: 0.73 mg/dL (ref 0.61–1.24)
Calcium: 7.6 mg/dL — ABNORMAL LOW (ref 8.9–10.3)
GFR calc non Af Amer: >60 mL/min (ref >60)
Glucose, Bld: 136 mg/dL — ABNORMAL HIGH (ref 65–99)
POTASSIUM: 3.6 mmol/L (ref 3.5–5.1)
Sodium: 152 mmol/L — ABNORMAL HIGH (ref 135–145)

## 2018-01-30 LAB — HEMOGLOBIN A1C
Hgb A1c MFr Bld: 6.2 % — ABNORMAL HIGH (ref 4.8–5.6)
Mean Plasma Glucose: 131.24 mg/dL

## 2018-01-30 LAB — GLUCOSE, CAPILLARY
GLUCOSE-CAPILLARY: 76 mg/dL (ref 65–99)
GLUCOSE-CAPILLARY: 112 mg/dL — AB (ref 65–99)
GLUCOSE-CAPILLARY: 124 mg/dL — AB (ref 65–99)
Glucose-Capillary: 120 mg/dL — ABNORMAL HIGH (ref 65–99)
Glucose-Capillary: 124 mg/dL — ABNORMAL HIGH (ref 65–99)

## 2018-01-30 LAB — CBC
HEMATOCRIT: 36.3 % — AB (ref 39.0–52.0)
Hemoglobin: 10.9 g/dL — ABNORMAL LOW (ref 13.0–17.0)
MCH: 30.5 pg (ref 26.0–34.0)
MCHC: 30 g/dL (ref 30.0–36.0)
MCV: 101.7 fL — AB (ref 78.0–100.0)
PLATELETS: 194 10*3/uL (ref 150–400)
RBC: 3.57 MIL/uL — AB (ref 4.22–5.81)
RDW: 14.7 % (ref 11.5–15.5)
WBC: 14.9 10*3/uL — AB (ref 4.0–10.5)

## 2018-01-30 NOTE — Progress Notes (Signed)
PROGRESS NOTE  Chad Everett ONG:[id number] DOB: 04/22/1931 DOA: 01/27/2018 PCP: [facility]   Brief History: 84 year old male with a history of dementia, hyperlipidemia, hypertension, COPD [facility] work. Apparently, the patient has had decreased oral intake and altered level of consciousness. Urinalysis at the nursing facility performed on 01/27/2018 showed TNTC WBC. Patient was given an intramuscular dose of ceftriaxone prior to arrival to the emergency department. The patient is unable to provide any history secondary to his dementia and encephalopathy. CBC at the nursing home also showed a WBC of 25.3. The patient was afebrile hemodynamically stable since admission. Lab work showed serum sodium 169 and WBC 20.6. The patient was intially started on vancomycin and cefepime and D5W.  Assessment/Plan: Sepsis -Present at the time of admission -Secondary to urinary source -Lactic acid peaked 3.0>>>1.2 -Procalcitonin 0.50 -Continue IV fluids -Continue IV antibiotics pending culture data -spiked fever 101.8 F last evening  UTI -Culture will likely be unrevealing as the patient received antibiotics prior to arrival -Continue cefepime -d/c vancomycin  AKI -Secondary to volume depletion -Baseline creatinine 0.5-0.7 -Presented with serum creatinine 1.52 -Improving with IV fluids  Hypernatremia -Continue D5W-->improving but remains significantly hypernatremic -pt has essentially zero oral intake  Acute metabolic encephalopathy -The patient is bedbound at baseline with poor functional status -more communicative today--"I'm ok I reckon", simple yes/no answers only -reconsult speech therapy now pt is more awake  Pulmonary opacity -Clinically does not have pneumonia as the patient has no tachypnea or hypoxia -Monitor clinically  Hyperglycemia -Check hemoglobin A1c--6.2 -D/C NovoLog  sliding scale and CBG checks  Dementia with behavioral disturbance -Haldol as needed agitation  Hyperlipidemia -Restart statin if able to toleratepo  Hypokalemia -Repleted -Check magnesium--2.3  GOC -pt has guardian -consult palliative medicine -given the patient's critical illness with debilitation and poor baseline level of function in the setting of the patient's dementia, the patient's overall prognosis is poor. Given the incurability of the patient's underlying chronic medical conditions including but not limited to his dementia, COPD, and debilitation, the patient is at high risk of recurrent hospitalizations with high risk of morbidity and mortality. As a result, I would strongly recommend his code status be changed to Do Not Resuscitate. -Given the patient's debilitation, encephalopathy, and no oral intake, the patient is at high risk for repeat hospitalization with morbidity and mortality. -case discussed with palliative medicine    Disposition Plan:SNF vs residential hospice 5/24 Family Communication:NoFamily at bedside  Consultants:Palliative medicine  Code Status: DNR  DVT Prophylaxis:  Lovenox   Procedures: As Listed in Progress Note Above  Antibiotics: vanco 6/19>>>6/22 Cefepime 6/19>>>      Subjective: "I'm ok I reckon".  Otherwise, patient only answering occasional yes/no answers.  He denies any shortness of breath or chest pain or abdominal pain.  Otherwise review of systems unobtainable.  Objective: Vitals:   01/29/18 1600 01/29/18 1900 01/30/18 0500 01/30/18 1407  BP:  114/76 110/64 (!) 144/67  Pulse:  70 72 75  Resp:  16 17 16   Temp:  97.8 F (36.6 C) 97.6 F (36.4 C) 97.8 F (36.6 C)  TempSrc:  Oral Oral   SpO2:  96% 94% 100%  Weight: 50.2 kg (110 lb 10.7 oz)     Height: 5\' 6"  (1.676 m)       Intake/Output Summary (Last 24 hours) at 01/30/2018 1658 Last data filed at 01/30/2018 1500 Gross per 24  hour    Intake 6463.33 ml  Output 800 ml  Net 5663.33 ml   Weight change: 0 kg (0 lb) Exam:   General:  Pt is alert, does not follow commands appropriately, not in acute distress  HEENT: No icterus, No thrush, No neck mass, [place]/AT  Cardiovascular: RRR, S1/S2, no rubs, no gallops  Respiratory: CTA bilaterally, no wheezing, no crackles, no rhonchi  Abdomen: Soft/+BS, non tender, non distended, no guarding  Extremities: No edema, No lymphangitis, No petechiae, No rashes, no synovitis   Data Reviewed: I have personally reviewed following labs and imaging studies Basic Metabolic Panel: Recent Labs  Lab 01/27/18 2006 01/28/18 0055 01/28/18 0405 01/29/18 0550 01/30/18 0710  NA 169* 168* 161* 155* 152*  K 2.8* 3.3* 3.3* 3.6 3.6  CL >130* >130* 128* 124* 121*  CO2 29 26 27 25 23   GLUCOSE 132* 234* 347* 153* 136*  BUN 83* 77* 69* 40* 23*  CREATININE 1.52* 1.36* 1.33* 0.97 0.73  CALCIUM 7.8* 7.8* 7.5* 7.7* 7.6*  MG 2.8*  --   --  2.3  --   PHOS 4.2  --   --   --   --    Liver Function Tests: Recent Labs  Lab 01/27/18 2006  AST 54*  ALT 39  ALKPHOS 90  BILITOT 0.8  PROT 7.2  ALBUMIN 2.4*   No results for input(s): LIPASE, AMYLASE in the last 168 hours. No results for input(s): AMMONIA in the last 168 hours. Coagulation Profile: Recent Labs  Lab 01/27/18 2006  INR 1.37   CBC: Recent Labs  Lab 01/27/18 2006 01/28/18 0405 01/29/18 0550 01/30/18 0710  WBC 19.7* 20.6* 15.6* 14.9*  NEUTROABS 17.1 17.7*  --   --   HGB 12.8* 11.9* 11.1* 10.9*  HCT 42.2 39.8 36.6* 36.3*  MCV 103.2* 104.7* 101.4* 101.7*  PLT 313 285 221 194   Cardiac Enzymes: No results for input(s): CKTOTAL, CKMB, CKMBINDEX, TROPONINI in the last 168 hours. BNP: Invalid input(s): POCBNP CBG: Recent Labs  Lab 01/30/18 0012 01/30/18 0343 01/30/18 0751 01/30/18 1115 01/30/18 1634  GLUCAP 124* 76 112* 120* 124*   HbA1C: Recent Labs    01/30/18 0710  HGBA1C 6.2*   Urine analysis:     Component Value Date/Time   COLORURINE YELLOW 10/22/2014 0932   APPEARANCEUR CLEAR 10/22/2014 0932   APPEARANCEUR Hazy 02/13/2012 1956   LABSPEC 1.015 10/22/2014 0932   LABSPEC 1.016 02/13/2012 1956   PHURINE 7.0 10/22/2014 0932   GLUCOSEU NEGATIVE 10/22/2014 0932   GLUCOSEU 150 mg/dL 16/05/9603 5409   HGBUR TRACE (A) 10/22/2014 0932   BILIRUBINUR NEGATIVE 10/22/2014 0932   BILIRUBINUR Negative 02/13/2012 1956   KETONESUR NEGATIVE 10/22/2014 0932   PROTEINUR 30 (A) 10/22/2014 0932   UROBILINOGEN 0.2 10/22/2014 0932   NITRITE NEGATIVE 10/22/2014 0932   LEUKOCYTESUR NEGATIVE 10/22/2014 0932   LEUKOCYTESUR Negative 02/13/2012 1956   Sepsis Labs: @LABRCNTIP (procalcitonin:4,lacticidven:4) ) Recent Results (from the past 240 hour(s))  MRSA PCR Screening     Status: Abnormal   Collection Time: 01/27/18 10:39 PM  Result Value Ref Range Status   MRSA by PCR POSITIVE (A) NEGATIVE Final    Comment:        The GeneXpert MRSA Assay (FDA approved for NASAL specimens only), is one component of a comprehensive MRSA colonization surveillance program. It is not intended to diagnose MRSA infection nor to guide or monitor treatment for MRSA infections. RESULT CALLED TO, READ BACK BY AND VERIFIED WITH: KOGER L. AT 8119J ON  [id number] BY THOMPSON S. Performed at [facility], [address] [place], [place] [address]   Culture, Urine     Status: None   Collection Time: 01/28/18  5:25 AM  Result Value Ref Range Status   Specimen Description   Final    URINE, CLEAN CATCH Performed at [facility], [address] [place], [place] [address]    Special Requests   Final    Normal Performed at [facility], [address] [place], [place] [address]    Culture   Final    NO GROWTH Performed at [facility], [address] [place], [place] [address]    Report Status 01/29/2018 FINAL  Final     Scheduled Meds: . Chlorhexidine Gluconate Cloth  6 each Topical Q0600  . enoxaparin  (LOVENOX) injection  30 mg Subcutaneous Q24H  . insulin aspart  0-9 Units Subcutaneous Q4H  . mouth rinse  15 mL Mouth Rinse BID  . mupirocin ointment  1 application Nasal BID   Continuous Infusions: . ceFEPime (MAXIPIME) IV Stopped (01/30/18 1605)  . dextrose 5 % with KCl 20 mEq / L 20 mEq (01/30/18 0500)  . vancomycin Stopped (01/29/18 2310)    Procedures/Studies: Dg Chest Port 1 View  Result Date: 01/27/2018 CLINICAL DATA:  Code sepsis.  History of dementia. EXAM: PORTABLE CHEST 1 VIEW COMPARISON:  Chest radiograph November 18, 2017 FINDINGS: Limited by patient positioning. Cardiomediastinal silhouette is normal. Calcified aortic knob. Patchy RIGHT upper lobe airspace opacity. Mild chronic interstitial changes. No pleural effusion. No pneumothorax. Osteopenia. IMPRESSION: Patchy RIGHT upper lobe airspace opacity may be artifact, atelectasis or pneumonia. Aortic Atherosclerosis (ICD10-I70.0). Electronically Signed   By: Awilda Metroourtnay  Bloomer M.D.   On: 01/27/2018 19:58    Catarina Hartshornavid Efrata Brunner, DO  [facility] Pager [phone]  If 7PM-7AM, please contact night-coverage [email or web address] Password [email or web address] 01/30/2018, 4:58 PM   LOS: 3 days

## 2018-01-31 DIAGNOSIS — E43 Unspecified severe protein-calorie malnutrition: Secondary | ICD-10-CM

## 2018-01-31 LAB — BASIC METABOLIC PANEL
Anion gap: 6 (ref 5–15)
BUN: 13 mg/dL (ref 6–20)
CHLORIDE: 112 mmol/L — AB (ref 101–111)
CO2: 25 mmol/L (ref 22–32)
Calcium: 7.5 mg/dL — ABNORMAL LOW (ref 8.9–10.3)
Creatinine, Ser: 0.64 mg/dL (ref 0.61–1.24)
GFR calc Af Amer: >60 mL/min (ref >60)
GFR calc non Af Amer: >60 mL/min (ref >60)
Glucose, Bld: 137 mg/dL — ABNORMAL HIGH (ref 65–99)
POTASSIUM: 3.5 mmol/L (ref 3.5–5.1)
Sodium: 143 mmol/L (ref 135–145)

## 2018-01-31 LAB — GLUCOSE, CAPILLARY
GLUCOSE-CAPILLARY: 132 mg/dL — AB (ref 65–99)
Glucose-Capillary: 125 mg/dL — ABNORMAL HIGH (ref 65–99)
Glucose-Capillary: 128 mg/dL — ABNORMAL HIGH (ref 65–99)
Glucose-Capillary: 129 mg/dL — ABNORMAL HIGH (ref 65–99)

## 2018-01-31 LAB — CBC
HEMATOCRIT: 35.1 % — AB (ref 39.0–52.0)
Hemoglobin: 10.8 g/dL — ABNORMAL LOW (ref 13.0–17.0)
MCH: 30.4 pg (ref 26.0–34.0)
MCHC: 30.8 g/dL (ref 30.0–36.0)
MCV: 98.9 fL (ref 78.0–100.0)
Platelets: 172 10*3/uL (ref 150–400)
RBC: 3.55 MIL/uL — AB (ref 4.22–5.81)
RDW: 14.2 % (ref 11.5–15.5)
WBC: 10.5 10*3/uL (ref 4.0–10.5)

## 2018-01-31 MED ORDER — SODIUM CHLORIDE 0.9 % IV SOLN
1.0000 g | INTRAVENOUS | Status: DC
  Filled 2018-01-31: qty 10

## 2018-01-31 MED ORDER — POTASSIUM CL IN DEXTROSE 5% 20 MEQ/L IV SOLN
20.0000 meq | INTRAVENOUS | Status: DC
  Administered 2018-02-01: 20 meq via INTRAVENOUS
  Filled 2018-01-31 (×3): qty 1000

## 2018-01-31 NOTE — Progress Notes (Signed)
Pharmacy Antibiotic Note   Day #5 of cefepime therapy for this [age] yom admitted with UTI.  Patient is currently afebrile, with a Tax of 37.1 and WBC count is WNL. Urine culture from 6/20 was negative  for growth, but MRSA PCR was positive on 6-19. Renal function is slowly improving.   Plan: Continue cefepime 1gm IV q24h. Monitor labs, micro and vitals.   Height: 5\' 6"  (167.6 cm) Weight: 110 lb 10.7 oz (50.2 kg) IBW/kg (Calculated) : 63.8  Temp (24hrs), Avg:98.3 F (36.8 C), Min:97.8 F (36.6 C), Max:98.7 F (37.1 C)  Recent Labs  Lab 01/27/18 2006 01/27/18 2320 01/28/18 0055 01/28/18 0405 01/29/18 0550 01/30/18 0710 01/31/18 0718  WBC 19.7*  --   --  20.6* 15.6* 14.9* 10.5  CREATININE 1.52*  --  1.36* 1.33* 0.97 0.73 0.64  LATICACIDVEN 3.0* 2.8*  --   --  1.3  --   --     Estimated Creatinine Clearance: 47.9 mL/min (by C-G formula based on SCr of 0.64 mg/dL).    Allergies  Allergen Reactions  . Onion Other (See Comments)    Indigestion    Antimicrobials this admission: Cefepime  6/19 >>  Vanc 6/19 >>   Dose adjustments this admission: n/a   Microbiology results:  6/20 UCx: ng (final)   6/19 MRSA PCR: positive    Thank you for allowing pharmacy to be a part of this patient's care.  Tama Highamara Aveer Bartow 01/31/2018 1:04 PM

## 2018-01-31 NOTE — Progress Notes (Signed)
PROGRESS NOTE  Chad DerrickJohn Haag ZOX:[id number] DOB: June 19, 1931 DOA: 01/27/2018 [facility]  Brief History: 84 year old male with a history of dementia, hyperlipidemia, hypertension, COPD [facility] and [facility] work. Apparently, the patient has had decreased oral intake and altered level of consciousness. Urinalysis at the nursing facility performed on 01/27/2018 showed TNTC WBC. Patient was given an intramuscular dose of ceftriaxone prior to arrival to the emergency department. The patient is unable to provide any history secondary to his dementia and encephalopathy. CBC at the nursing home also showed a WBC of 25.3. The patient was afebrile hemodynamically stable since admission. Lab work showed serum sodium 169 and WBC 20.6. The patient was intially started on vancomycin and cefepime and D5W.  Assessment/Plan: Sepsis -Present at the time of admission -Secondary to urinary source -Lactic acid peaked 3.0>>>1.2 -Procalcitonin 0.50 -Continue IV fluids--decrease rate -completed 5 days of abx -sepsis physiology resolved  UTI -Culture unrevealing as the patient received antibiotics prior to arrival -Continue cefepime-->finished 5 days -d/c vancomycin  AKI -Secondary to volume depletion -Baseline creatinine 0.5-0.7 -Presented with serum creatinine 1.52 -Improved with IV fluids  Hypernatremia -Continue D5W-->improvingbut remains significantly hypernatremic -pt has essentially zero oral intake -improved -switch to D5-1/2NS  Acute metabolic encephalopathy -The patient is bedbound at baseline with poor functional status -intermittently communicative, not speaking today -reconsult speech therapy now pt is more awake  Pulmonary opacity -Clinically does not have pneumonia as the patient has no tachypnea or hypoxia -Monitor clinically  Hyperglycemia -Check hemoglobin A1c--6.2 -D/C NovoLog  sliding scale and CBG checks  Dementia with behavioral disturbance -Haldol as needed agitation  Hyperlipidemia -Restart statinifable to toleratepo  Hypokalemia -Repleted -Check magnesium--2.3  GOC -pt has guardian -consult palliative medicine -given the patient's critical illness with debilitation and poor baseline level of function in the setting of the patient's dementia, the patient's overall prognosis is poor. Given the incurability of the patient's underlying chronic medical conditions including but not limited to his dementia, COPD, and debilitation, the patient is at high risk of recurrent hospitalizations with high risk of morbidity and mortality. As a result, I would strongly recommend his code status be changed to Do Not Resuscitate. -Given the patient's debilitation, encephalopathy, and no oral intake, the patient is at high risk for repeat hospitalization with morbidity and mortality. -case discussed with palliative medicine    Disposition Plan:SNFvs residential hospice 5/24 Family Communication:NoFamily at bedside  Consultants:Palliative medicine  Code Status:DNR  DVT Prophylaxis:  Lovenox   Procedures: As Listed in Progress Note Above  Antibiotics: vanco 6/19>>>6/22 Cefepime 6/19>>>6/23         Subjective: Pt is awake and alert.  He is drinking fluids, but not eating.  He is not speaking today.  No reports of vomiting, diarrhea, sob, uncontrolled pain.  Objective: Vitals:   01/30/18 0500 01/30/18 1407 01/31/18 0649 01/31/18 1447  BP: 110/64 (!) 144/67 (!) 130/52 (!) 161/65  Pulse: 72 75 78 64  Resp: 17 16 18 16   Temp: 97.6 F (36.4 C) 97.8 F (36.6 C) 98.7 F (37.1 C) 97.8 F (36.6 C)  TempSrc: Oral  Oral   SpO2: 94% 100%  100%  Weight:      Height:        Intake/Output Summary (Last 24 hours) at 01/31/2018 1756 Last data filed at 01/31/2018 1200 Gross per 24 hour  Intake 360 ml  Output -  Net 360 ml  Weight change:  Exam:   General:  Pt is alert, does not follow commands appropriately, not in acute distress  HEENT: No icterus, No thrush, No neck mass, [place]/AT  Cardiovascular: RRR, S1/S2, no rubs, no gallops  Respiratory: diminished BS but CTA bilaterally, no wheezing, no crackles, no rhonchi  Abdomen: Soft/+BS, non tender, non distended, no guarding  Extremities: No edema, No lymphangitis, No petechiae, No rashes, no synovitis   Data Reviewed: I have personally reviewed following labs and imaging studies Basic Metabolic Panel: Recent Labs  Lab 01/27/18 2006 01/28/18 0055 01/28/18 0405 01/29/18 0550 01/30/18 0710 01/31/18 0718  NA 169* 168* 161* 155* 152* 143  K 2.8* 3.3* 3.3* 3.6 3.6 3.5  CL >130* >130* 128* 124* 121* 112*  CO2 29 26 27 25 23 25   GLUCOSE 132* 234* 347* 153* 136* 137*  BUN 83* 77* 69* 40* 23* 13  CREATININE 1.52* 1.36* 1.33* 0.97 0.73 0.64  CALCIUM 7.8* 7.8* 7.5* 7.7* 7.6* 7.5*  MG 2.8*  --   --  2.3  --   --   PHOS 4.2  --   --   --   --   --    Liver Function Tests: Recent Labs  Lab 01/27/18 2006  AST 54*  ALT 39  ALKPHOS 90  BILITOT 0.8  PROT 7.2  ALBUMIN 2.4*   No results for input(s): LIPASE, AMYLASE in the last 168 hours. No results for input(s): AMMONIA in the last 168 hours. Coagulation Profile: Recent Labs  Lab 01/27/18 2006  INR 1.37   CBC: Recent Labs  Lab 01/27/18 2006 01/28/18 0405 01/29/18 0550 01/30/18 0710 01/31/18 0718  WBC 19.7* 20.6* 15.6* 14.9* 10.5  NEUTROABS 17.1 17.7*  --   --   --   HGB 12.8* 11.9* 11.1* 10.9* 10.8*  HCT 42.2 39.8 36.6* 36.3* 35.1*  MCV 103.2* 104.7* 101.4* 101.7* 98.9  PLT 313 285 221 194 172   Cardiac Enzymes: No results for input(s): CKTOTAL, CKMB, CKMBINDEX, TROPONINI in the last 168 hours. BNP: Invalid input(s): POCBNP CBG: Recent Labs  Lab 01/30/18 1634 01/31/18 0007 01/31/18 0728 01/31/18 1102 01/31/18 1601  GLUCAP 124* 128* 129* 125* 132*   HbA1C: Recent Labs      01/30/18 0710  HGBA1C 6.2*   Urine analysis:    Component Value Date/Time   COLORURINE YELLOW 10/22/2014 0932   APPEARANCEUR CLEAR 10/22/2014 0932   APPEARANCEUR Hazy 02/13/2012 1956   LABSPEC 1.015 10/22/2014 0932   LABSPEC 1.016 02/13/2012 1956   PHURINE 7.0 10/22/2014 0932   GLUCOSEU NEGATIVE 10/22/2014 0932   GLUCOSEU 150 mg/dL 16/05/9603 5409   HGBUR TRACE (A) 10/22/2014 0932   BILIRUBINUR NEGATIVE 10/22/2014 0932   BILIRUBINUR Negative 02/13/2012 1956   KETONESUR NEGATIVE 10/22/2014 0932   PROTEINUR 30 (A) 10/22/2014 0932   UROBILINOGEN 0.2 10/22/2014 0932   NITRITE NEGATIVE 10/22/2014 0932   LEUKOCYTESUR NEGATIVE 10/22/2014 0932   LEUKOCYTESUR Negative 02/13/2012 1956   Sepsis Labs: @LABRCNTIP (procalcitonin:4,lacticidven:4) ) Recent Results (from the past 240 hour(s))  MRSA PCR Screening     Status: Abnormal   Collection Time: 01/27/18 10:39 PM  Result Value Ref Range Status   MRSA by PCR POSITIVE (A) NEGATIVE Final    Comment:        The GeneXpert MRSA Assay (FDA approved for NASAL specimens only), is one component of a comprehensive MRSA colonization surveillance program. It is not intended to diagnose MRSA infection nor to guide or monitor treatment for MRSA infections. RESULT CALLED TO, READ  BACK BY AND VERIFIED WITH: KOGER L. AT 0738A ON [id number] BY THOMPSON S. Performed at [facility], [address] [place], [place] [address]   Culture, Urine     Status: None   Collection Time: 01/28/18  5:25 AM  Result Value Ref Range Status   Specimen Description   Final    URINE, CLEAN CATCH Performed at [facility], [address] [place], [place] [address]    Special Requests   Final    Normal Performed at [facility], [address] [place], [place] [address]    Culture   Final    NO GROWTH Performed at [facility], [address], [place], [place] [address]    Report Status 01/29/2018 FINAL  Final     Scheduled Meds: . Chlorhexidine  Gluconate Cloth  6 each Topical Q0600  . enoxaparin (LOVENOX) injection  30 mg Subcutaneous Q24H  . mouth rinse  15 mL Mouth Rinse BID  . mupirocin ointment  1 application Nasal BID   Continuous Infusions: . [START ON 02/01/2018] cefTRIAXone (ROCEPHIN)  IV    . dextrose 5 % with KCl 20 mEq / L 20 mEq (01/31/18 1308)    Procedures/Studies: Dg Chest Port 1 View  Result Date: 01/27/2018 CLINICAL DATA:  Code sepsis.  History of dementia. EXAM: PORTABLE CHEST 1 VIEW COMPARISON:  Chest radiograph November 18, 2017 FINDINGS: Limited by patient positioning. Cardiomediastinal silhouette is normal. Calcified aortic knob. Patchy RIGHT upper lobe airspace opacity. Mild chronic interstitial changes. No pleural effusion. No pneumothorax. Osteopenia. IMPRESSION: Patchy RIGHT upper lobe airspace opacity may be artifact, atelectasis or pneumonia. Aortic Atherosclerosis (ICD10-I70.0). Electronically Signed   By: Awilda Metro M.D.   On: 01/27/2018 19:58    Catarina Hartshorn, DO  [facility] Pager [phone]  If 7PM-7AM, please contact night-coverage [email or web address] Password [facility] 01/31/2018, 5:56 PM   LOS: 4 days

## 2018-02-01 ENCOUNTER — Telehealth: Payer: Self-pay | Admitting: Orthopedic Surgery

## 2018-02-01 ENCOUNTER — Encounter: Payer: Self-pay | Admitting: Orthopedic Surgery

## 2018-02-01 DIAGNOSIS — J438 Other emphysema: Secondary | ICD-10-CM

## 2018-02-01 DIAGNOSIS — Z7189 Other specified counseling: Secondary | ICD-10-CM

## 2018-02-01 DIAGNOSIS — Z66 Do not resuscitate: Secondary | ICD-10-CM

## 2018-02-01 DIAGNOSIS — I5032 Chronic diastolic (congestive) heart failure: Secondary | ICD-10-CM

## 2018-02-01 HISTORY — DX: Do not resuscitate: Z66

## 2018-02-01 LAB — BASIC METABOLIC PANEL
ANION GAP: 9 (ref 5–15)
BUN: 9 mg/dL (ref 6–20)
CO2: 25 mmol/L (ref 22–32)
Calcium: 7.6 mg/dL — ABNORMAL LOW (ref 8.9–10.3)
Chloride: 105 mmol/L (ref 101–111)
Creatinine, Ser: 0.64 mg/dL (ref 0.61–1.24)
GFR calc non Af Amer: >60 mL/min (ref >60)
GLUCOSE: 126 mg/dL — AB (ref 65–99)
POTASSIUM: 3.7 mmol/L (ref 3.5–5.1)
Sodium: 139 mmol/L (ref 135–145)

## 2018-02-01 LAB — CBC
HEMATOCRIT: 37.8 % — AB (ref 39.0–52.0)
HEMOGLOBIN: 12 g/dL — AB (ref 13.0–17.0)
MCH: 31.4 pg (ref 26.0–34.0)
MCHC: 31.7 g/dL (ref 30.0–36.0)
MCV: 99 fL (ref 78.0–100.0)
Platelets: 182 10*3/uL (ref 150–400)
RBC: 3.82 MIL/uL — AB (ref 4.22–5.81)
RDW: 13.8 % (ref 11.5–15.5)
WBC: 12.2 10*3/uL — ABNORMAL HIGH (ref 4.0–10.5)

## 2018-02-01 LAB — MAGNESIUM: MAGNESIUM: 2 mg/dL (ref 1.7–2.4)

## 2018-02-01 NOTE — Progress Notes (Signed)
[facility] per Dr. Arbutus Leasat. Pt's IV site D/C'd and WDL. P'ts VSS. Report called to Colin MuldersBrianna, [facility]. Verbalized understanding. Pt currently awaiting EMS transport back to facility.

## 2018-02-01 NOTE — Care Management Important Message (Signed)
Important Message  Patient Details  Name: Lenor DerrickJohn Guzy MRN: [id number] Date of Birth: [birth year]   Medicare Important Message Given:  Yes    [name], Chealsea Paske Smith 02/01/2018, 12:28 PM

## 2018-02-01 NOTE — Progress Notes (Signed)
Federico FlakeLucinda Williams, Legal Guardian, called and made aware of discharge. Verbalized understanding.

## 2018-02-01 NOTE — Clinical Social Work Note (Signed)
Facility notified of discharge and discharge clinicals sent via [employer].  Chad SandersMichelle Everett, Supervior with [facility], notified of patient's discharge.   LCSW signing off.   Chad Everett, Chad ChinaHeather D, LCSW

## 2018-02-01 NOTE — Progress Notes (Signed)
  Speech Language Pathology Treatment: Dysphagia  Patient Details Name: Chad Everett Keep MRN: [id number] DOB: March 27, 1931 Today's Date: 02/01/2018 Time: [id number] SLP Time Calculation (min) (ACUTE ONLY): 25 min  Assessment / Plan / Recommendation Clinical Impression  Pt seen for ongoing diagnostic dysphagia intervention following improved alertness over the last day. Pt alert, cooperative, and verbally interactive with prompting this date. Pt repositioned upright and allowed oral care which revealed white coating on tongue (does not appear to be thrush but difficult to tell). Pt assessed with ice chips, cup/straw sips water, puree, regular textures, and mechanical soft textures. Pt with mildly prolonged oral phase with solids likely impacted by edentulous status and cognitive impairment; occasional delayed throat clear following dry crackers and sips of water. Pt able to follow simple commands to open mouth, cough, etc. ~50% of the time with mod multimodality cues (best with visual cue and model). Pt allowed oral care following trials. Recommend D2/chopped with thin liquids when Pt is alert and upright. Ensure clear oral cavity before presenting with additional food and at end of meal. Recommend PO meds crushed in puree as able. SLP will follow for diet tolerance. Above to nursing staff.    HPI HPI: 84 y.o. male with medical history significant of anxiety, asthma, dementia, GERD, hyperlipidemia, hypertension, episodes of pneumonia who [facility] due to abnormal labs and altered level consciousness.  Labs done at the facility showed leukocytosis of more than  25,000 with significant pyuria on urinalysis      SLP Plan  Continue with current plan of care       Recommendations  Diet recommendations: Dysphagia 2 (fine chop);Thin liquid Liquids provided via: Cup;Straw Medication Administration: Crushed with puree Supervision: Staff to assist with self feeding;Full supervision/cueing for  compensatory strategies Compensations: Minimize environmental distractions;Slow rate;Small sips/bites;Lingual sweep for clearance of pocketing Postural Changes and/or Swallow Maneuvers: Seated upright 90 degrees;Upright 30-60 min after meal                Oral Care Recommendations: Staff/trained caregiver to provide oral care;Oral care before and after PO Follow up Recommendations: 24 hour supervision/assistance SLP Visit Diagnosis: Dysphagia, oropharyngeal phase (R13.12) Plan: Continue with current plan of care       Thank you,  Havery MorosDabney Lianna Sitzmann, CCC-SLP [phone]                Cing  02/01/2018, 11:26 AM

## 2018-02-01 NOTE — Discharge Summary (Addendum)
Physician Discharge Summary  Lenor DerrickJohn Quinby RUE:[id number] DOB: February 20, 1931 DOA: 01/27/2018  [facility]  Admit date: 01/27/2018 Discharge date: 02/01/2018  Admitted From: SNF Disposition:  SNF with hospice   Discharge Condition: Stable CODE STATUS: DNR Diet recommendation: Dysphagia 2 with thin liquids   Brief/Interim Summary: 84 year old male with a history of dementia, hyperlipidemia, hypertension, [facility] work. Apparently, the patient has had decreased oral intake and altered level of consciousness. Urinalysis at the nursing facility performed on 01/27/2018 showed TNTC WBC. Patient was given an intramuscular dose of ceftriaxone prior to arrival to the emergency department. The patient is unable to provide any history secondary to his dementia and encephalopathy. CBC at the nursing home also showed a WBC of 25.3. The patient was afebrile hemodynamically stable since admission. Lab work showed serum sodium 169 and WBC 20.6. The patient was intiallystarted on vancomycin and cefepime and D5W.  The patient showed some clinical improvement with his mental status, but he remained poorly communicative.  The patient refused to eat and had poor oral intake.  Palliative medicine was consulted.  After discussing with social services, decision was made to change the patient to DNR status.  The patient will be ultimately discharged back to his nursing facility with hospice services.    Discharge Diagnoses:  Sepsis -Present at the time of admission -Secondary to urinary source -Lactic acid peaked 3.0>>>1.2 -Procalcitonin 0.50 -Continue IV fluids--decrease rate -completed 5 days of abx -sepsis physiology resolved  UTI -Culture unrevealing as the patient received antibiotics prior to arrival -Continue cefepime-->finished 5 days -d/c vancomycin  AKI -Secondary to volume depletion -Baseline  creatinine 0.5-0.7 -Presented with serum creatinine 1.52 -Improved with IV fluids -serum creatinine 0.64 on day of d/c  Hypernatremia -Continue D5W-->improvingbut remains significantly hypernatremic -pt has essentially zero oral intake -improved -switched to D5-1/2NS -Na 139 on day of d/c  Acute metabolic encephalopathy -The patient is bedbound at baseline with poor functional status -intermittently communicative--"I'm doing ok" -reconsult speech therapy now pt is more awake-->dys 2 with thin liquids  Pulmonary opacity -Clinically does not have pneumonia as the patient has no tachypnea or hypoxia -Monitor clinically  Hyperglycemia -Check hemoglobin A1c--6.2 -D/CNovoLog sliding scaleand CBG checks  Dementia with behavioral disturbance -Haldol as needed agitation  Hyperlipidemia -Restart statinifable to toleratepo  Hypokalemia -Repleted -Check magnesium--2.3  GOC -pt has guardian -consult palliative medicine -given the patient's critical illness with debilitation and poor baseline level of function in the setting of the patient's dementia, the patient's overall prognosis is poor. Given the incurability of the patient's underlying chronic medical conditions including but not limited to his dementia, COPD, and debilitation, the patient is at high risk of recurrent hospitalizations with high risk of morbidity and mortality. As a result, I would strongly recommend his code status be changed to Do Not Resuscitate. -Given the patient's debilitation, encephalopathy, and no oral intake, the patient is at high risk for repeat hospitalization with morbidity and mortality. -case discussed with palliative medicine     Discharge Instructions   Allergies as of 02/01/2018      Reactions   Onion Other (See Comments)   Indigestion      Medication List    STOP taking these medications   cefTRIAXone IVPB Commonly known as:  ROCEPHIN     TAKE these medications     acetaminophen 325 MG tablet Commonly known as:  TYLENOL Take 650 mg by mouth every 4 (four) hours as  needed for moderate pain.   aspirin 81 MG tablet Take 81 mg by mouth daily.   atorvastatin 40 MG tablet Commonly known as:  LIPITOR Take 40 mg by mouth every evening.   Co Q-10 200 MG Caps Take 1 capsule by mouth daily.   guaifenesin 100 MG/5ML syrup Commonly known as:  ROBITUSSIN Take 200 mg by mouth every 6 (six) hours as needed for cough.   loperamide 2 MG capsule Commonly known as:  IMODIUM Take 2 mg by mouth as needed for diarrhea or loose stools.   MINTOX 200-200-20 MG/5ML suspension Generic drug:  alum & mag hydroxide-simeth Take 30 mLs by mouth daily as needed for indigestion or heartburn.   omeprazole 40 MG capsule Commonly known as:  PRILOSEC Take 40 mg by mouth daily.   psyllium 0.52 g capsule Commonly known as:  REGULOID Take 0.52 g by mouth daily.       Allergies  Allergen Reactions  . Onion Other (See Comments)    Indigestion    Consultations:  Palliative medicine   Procedures/Studies: Dg Chest Port 1 View  Result Date: 01/27/2018 CLINICAL DATA:  Code sepsis.  History of dementia. EXAM: PORTABLE CHEST 1 VIEW COMPARISON:  Chest radiograph November 18, 2017 FINDINGS: Limited by patient positioning. Cardiomediastinal silhouette is normal. Calcified aortic knob. Patchy RIGHT upper lobe airspace opacity. Mild chronic interstitial changes. No pleural effusion. No pneumothorax. Osteopenia. IMPRESSION: Patchy RIGHT upper lobe airspace opacity may be artifact, atelectasis or pneumonia. Aortic Atherosclerosis (ICD10-I70.0). Electronically Signed   By: Awilda Metro M.D.   On: 01/27/2018 19:58        Discharge Exam: Vitals:   01/31/18 2351 02/01/18 0722  BP: (!) 151/76 (!) 145/77  Pulse: 72 86  Resp:  19  Temp: 98.2 F (36.8 C) (!) 97.4 F (36.3 C)  SpO2: 100% 100%   Vitals:   01/31/18 0649 01/31/18 1447 01/31/18 2351 02/01/18 0722  BP: (!)  130/52 (!) 161/65 (!) 151/76 (!) 145/77  Pulse: 78 64 72 86  Resp: 18 16  19   Temp: 98.7 F (37.1 C) 97.8 F (36.6 C) 98.2 F (36.8 C) (!) 97.4 F (36.3 C)  TempSrc: Oral  Oral Oral  SpO2:  100% 100% 100%  Weight:      Height:        General: Pt is alert, awake, not in acute distress Cardiovascular: RRR, S1/S2 +, no rubs, no gallops Respiratory: bibasilar crackles. No wheeze Abdominal: Soft, NT, ND, bowel sounds + Extremities: no edema, no cyanosis   The results of significant diagnostics from this hospitalization (including imaging, microbiology, ancillary and laboratory) are listed below for reference.    Significant Diagnostic Studies: Dg Chest Port 1 View  Result Date: 01/27/2018 CLINICAL DATA:  Code sepsis.  History of dementia. EXAM: PORTABLE CHEST 1 VIEW COMPARISON:  Chest radiograph November 18, 2017 FINDINGS: Limited by patient positioning. Cardiomediastinal silhouette is normal. Calcified aortic knob. Patchy RIGHT upper lobe airspace opacity. Mild chronic interstitial changes. No pleural effusion. No pneumothorax. Osteopenia. IMPRESSION: Patchy RIGHT upper lobe airspace opacity may be artifact, atelectasis or pneumonia. Aortic Atherosclerosis (ICD10-I70.0). Electronically Signed   By: Awilda Metro M.D.   On: 01/27/2018 19:58     Microbiology: Recent Results (from the past 240 hour(s))  MRSA PCR Screening     Status: Abnormal   Collection Time: 01/27/18 10:39 PM  Result Value Ref Range Status   MRSA by PCR POSITIVE (A) NEGATIVE Final    Comment:  The GeneXpert MRSA Assay (FDA approved for NASAL specimens only), is one component of a comprehensive MRSA colonization surveillance program. It is not intended to diagnose MRSA infection nor to guide or monitor treatment for MRSA infections. RESULT CALLED TO, READ BACK BY AND VERIFIED WITH: KOGER L. AT 0738A ON [id number] BY THOMPSON S. Performed at [facility], [address] [place], [place] [address]     Culture, Urine     Status: None   Collection Time: 01/28/18  5:25 AM  Result Value Ref Range Status   Specimen Description   Final    URINE, CLEAN CATCH Performed at [facility], [address], [place], [place] [address]    Special Requests   Final    Normal Performed at [facility], [address], [place], [place] [address]    Culture   Final    NO GROWTH Performed at [facility], [address] [place], [place] [address]    Report Status 01/29/2018 FINAL  Final     Labs: Basic Metabolic Panel: Recent Labs  Lab 01/27/18 2006  01/28/18 0405 01/29/18 0550 01/30/18 0710 01/31/18 0718 02/01/18 0426  NA 169*   < > 161* 155* 152* 143 139  K 2.8*   < > 3.3* 3.6 3.6 3.5 3.7  CL >130*   < > 128* 124* 121* 112* 105  CO2 29   < > 27 25 23 25 25   GLUCOSE 132*   < > 347* 153* 136* 137* 126*  BUN 83*   < > 69* 40* 23* 13 9  CREATININE 1.52*   < > 1.33* 0.97 0.73 0.64 0.64  CALCIUM 7.8*   < > 7.5* 7.7* 7.6* 7.5* 7.6*  MG 2.8*  --   --  2.3  --   --  2.0  PHOS 4.2  --   --   --   --   --   --    < > = values in this interval not displayed.   Liver Function Tests: Recent Labs  Lab 01/27/18 2006  AST 54*  ALT 39  ALKPHOS 90  BILITOT 0.8  PROT 7.2  ALBUMIN 2.4*   No results for input(s): LIPASE, AMYLASE in the last 168 hours. No results for input(s): AMMONIA in the last 168 hours. CBC: Recent Labs  Lab 01/27/18 2006 01/28/18 0405 01/29/18 0550 01/30/18 0710 01/31/18 0718 02/01/18 0426  WBC 19.7* 20.6* 15.6* 14.9* 10.5 12.2*  NEUTROABS 17.1 17.7*  --   --   --   --   HGB 12.8* 11.9* 11.1* 10.9* 10.8* 12.0*  HCT 42.2 39.8 36.6* 36.3* 35.1* 37.8*  MCV 103.2* 104.7* 101.4* 101.7* 98.9 99.0  PLT 313 285 221 194 172 182   Cardiac Enzymes: No results for input(s): CKTOTAL, CKMB, CKMBINDEX, TROPONINI in the last 168 hours. BNP: Invalid input(s): POCBNP CBG: Recent Labs  Lab 01/30/18 1634 01/31/18 0007 01/31/18 0728 01/31/18 1102 01/31/18 1601  GLUCAP  124* 128* 129* 125* 132*    Time coordinating discharge:  36 minutes  Signed:  Catarina Hartshorn, DO [facility] Pager: [phone] 02/01/2018, 1:35 PM

## 2018-02-01 NOTE — Progress Notes (Signed)
Palliative: [name] is resting quietly in bed.  He is very hard of hearing, but will make and briefly keep eye contact if you are very close to his face when you speak, get inside his field of vision.  He tells me today that he is "doing okay, better".  I ask if he would like something to eat or drink, although he is making eye contact, he does not answer.  There is no family at bedside at this time.  Conference with speech therapy related to dietary options, plan of care. Conference with hospitalist related to plan of care, disposition.  Call from DSS social worker, Weldon InchesLucinda Wilson.  Poor connection, I go to my office for a conference. To [facility] [facility] social worker, Weldon InchesLucinda Wilson.  [phone] extension [phone].  We talked about [name]'s improvement today.  I share my continued worries about the next infection or illness.  We talked about urinary tract infection being complicated in men, I anticipate another infection.  We also talked about pneumonia, related to poor mobility and some dysphasia.  I share that it is not if but when [name] gets sick again.  We also talked about [name]'s poor by mouth intake, recommendation for dysphasia 2 diet.  I shared that I anticipate continued further declines and desire and ability to eat.  I encouraged Ms. Wilson to consider how to best care for [name] at that time, should he be returned to the hospital, or be made comfortable.  I shared that she is not expected to make a choice at this time, but these are valid concerns/ questions related to comfort and dignity, care for [name].  We talked about hospice in place at residential SNF[name].  I share that hospice can continue to "treat the treatable", hospice is not always comfort care only.  Ms. Andrey CampanileWilson states that she will discuss rehospitalization with her supervisors, and will also work with residential SNF for in facility, treat the treatable hospice.  No further questions or concerns at this  time. 35 minutes Lillia Carmelasha Dove, NP Palliative Medicine Team Team Phone # [phone]

## 2018-02-01 NOTE — Telephone Encounter (Signed)
Per call from Weldon InchesLucinda Wilson, patient's guardian, [place] [employer]-patient is in hospital ([facility]) and will need to cancel appointment for post operative right hip follow up. Letter sent out regarding re-scheduling.

## 2018-02-03 ENCOUNTER — Ambulatory Visit (INDEPENDENT_AMBULATORY_CARE_PROVIDER_SITE_OTHER): Payer: Self-pay | Admitting: Orthopaedic Surgery

## 2018-02-03 ENCOUNTER — Encounter: Payer: Self-pay | Admitting: Orthopaedic Surgery

## 2018-02-03 ENCOUNTER — Ambulatory Visit (INDEPENDENT_AMBULATORY_CARE_PROVIDER_SITE_OTHER): Payer: No Typology Code available for payment source

## 2018-02-03 ENCOUNTER — Ambulatory Visit: Payer: Medicare Other | Admitting: Orthopedic Surgery

## 2018-02-03 VITALS — BP 110/65 | HR 85 | Resp 14

## 2018-02-03 DIAGNOSIS — Z9889 Other specified postprocedural states: Secondary | ICD-10-CM

## 2018-02-03 DIAGNOSIS — Z967 Presence of other bone and tendon implants: Secondary | ICD-10-CM | POA: Diagnosis not present

## 2018-02-03 DIAGNOSIS — Z8781 Personal history of (healed) traumatic fracture: Secondary | ICD-10-CM

## 2018-02-03 NOTE — Progress Notes (Signed)
He had right hip surgery for femoral neck fracture.  He is very hard of hearing.  He is in a wheelchair and resident at local nursing home.  He is receiving PT.  Encounter Diagnosis  Name Primary?  . S/P ORIF (open reduction internal fixation) fracture right hip cannulated screw placement 11/19/17 Yes   X-rays were done.  Forms for nursing home completed.  Return in six weeks.  X-rays on return.  Call if any problem.  Precautions discussed.   Electronically Signed Darreld McleanWayne Nimrit Kehres, MD 6/26/20193:22 PM

## 2018-02-04 LAB — URINE CULTURE

## 2018-02-17 ENCOUNTER — Encounter (HOSPITAL_COMMUNITY): Payer: Self-pay | Admitting: Emergency Medicine

## 2018-02-17 ENCOUNTER — Other Ambulatory Visit: Payer: Self-pay

## 2018-02-17 ENCOUNTER — Emergency Department (HOSPITAL_COMMUNITY)

## 2018-02-17 ENCOUNTER — Inpatient Hospital Stay (HOSPITAL_COMMUNITY)
Admission: EM | Admit: 2018-02-17 | Discharge: 2018-02-19 | DRG: 871 | Disposition: A | Discharge status: Hospice | Attending: Internal Medicine | Admitting: Internal Medicine

## 2018-02-17 DIAGNOSIS — J438 Other emphysema: Secondary | ICD-10-CM | POA: Diagnosis not present

## 2018-02-17 DIAGNOSIS — R6521 Severe sepsis with septic shock: Secondary | ICD-10-CM | POA: Diagnosis present

## 2018-02-17 DIAGNOSIS — B9689 Other specified bacterial agents as the cause of diseases classified elsewhere: Secondary | ICD-10-CM | POA: Diagnosis present

## 2018-02-17 DIAGNOSIS — F0390 Unspecified dementia without behavioral disturbance: Secondary | ICD-10-CM | POA: Diagnosis present

## 2018-02-17 DIAGNOSIS — A419 Sepsis, unspecified organism: Secondary | ICD-10-CM | POA: Diagnosis present

## 2018-02-17 DIAGNOSIS — Z8701 Personal history of pneumonia (recurrent): Secondary | ICD-10-CM

## 2018-02-17 DIAGNOSIS — Z7401 Bed confinement status: Secondary | ICD-10-CM

## 2018-02-17 DIAGNOSIS — Z8619 Personal history of other infectious and parasitic diseases: Secondary | ICD-10-CM

## 2018-02-17 DIAGNOSIS — Z66 Do not resuscitate: Secondary | ICD-10-CM | POA: Diagnosis present

## 2018-02-17 DIAGNOSIS — I5032 Chronic diastolic (congestive) heart failure: Secondary | ICD-10-CM | POA: Diagnosis present

## 2018-02-17 DIAGNOSIS — R531 Weakness: Secondary | ICD-10-CM | POA: Diagnosis present

## 2018-02-17 DIAGNOSIS — Z87440 Personal history of urinary (tract) infections: Secondary | ICD-10-CM

## 2018-02-17 DIAGNOSIS — Z91018 Allergy to other foods: Secondary | ICD-10-CM | POA: Diagnosis not present

## 2018-02-17 DIAGNOSIS — Z515 Encounter for palliative care: Secondary | ICD-10-CM | POA: Diagnosis present

## 2018-02-17 DIAGNOSIS — N390 Urinary tract infection, site not specified: Secondary | ICD-10-CM

## 2018-02-17 DIAGNOSIS — N179 Acute kidney failure, unspecified: Secondary | ICD-10-CM | POA: Diagnosis present

## 2018-02-17 DIAGNOSIS — I110 Hypertensive heart disease with heart failure: Secondary | ICD-10-CM | POA: Diagnosis present

## 2018-02-17 DIAGNOSIS — K219 Gastro-esophageal reflux disease without esophagitis: Secondary | ICD-10-CM | POA: Diagnosis present

## 2018-02-17 DIAGNOSIS — Z7982 Long term (current) use of aspirin: Secondary | ICD-10-CM

## 2018-02-17 DIAGNOSIS — Z79899 Other long term (current) drug therapy: Secondary | ICD-10-CM | POA: Diagnosis not present

## 2018-02-17 DIAGNOSIS — J449 Chronic obstructive pulmonary disease, unspecified: Secondary | ICD-10-CM | POA: Diagnosis present

## 2018-02-17 DIAGNOSIS — E860 Dehydration: Secondary | ICD-10-CM | POA: Diagnosis present

## 2018-02-17 DIAGNOSIS — Z87891 Personal history of nicotine dependence: Secondary | ICD-10-CM | POA: Diagnosis not present

## 2018-02-17 DIAGNOSIS — E785 Hyperlipidemia, unspecified: Secondary | ICD-10-CM | POA: Diagnosis present

## 2018-02-17 HISTORY — DX: Do not resuscitate: Z66

## 2018-02-17 HISTORY — DX: Bed confinement status: Z74.01

## 2018-02-17 LAB — COMPREHENSIVE METABOLIC PANEL
ALT: 14 U/L (ref 0–44)
AST: 22 U/L (ref 15–41)
Albumin: 2.2 g/dL — ABNORMAL LOW (ref 3.5–5.0)
Alkaline Phosphatase: 158 U/L — ABNORMAL HIGH (ref 38–126)
Anion gap: 13 (ref 5–15)
BUN: 31 mg/dL — ABNORMAL HIGH (ref 8–23)
CHLORIDE: 104 mmol/L (ref 98–111)
CO2: 22 mmol/L (ref 22–32)
CREATININE: 1.61 mg/dL — AB (ref 0.61–1.24)
Calcium: 7.7 mg/dL — ABNORMAL LOW (ref 8.9–10.3)
GFR calc Af Amer: 43 mL/min — ABNORMAL LOW (ref >60)
GFR, EST NON AFRICAN AMERICAN: 37 mL/min — AB (ref >60)
Glucose, Bld: 104 mg/dL — ABNORMAL HIGH (ref 70–99)
POTASSIUM: 3.1 mmol/L — AB (ref 3.5–5.1)
SODIUM: 139 mmol/L (ref 135–145)
Total Bilirubin: 1 mg/dL (ref 0.3–1.2)
Total Protein: 7.1 g/dL (ref 6.5–8.1)

## 2018-02-17 LAB — PROTIME-INR
INR: 1.36
Prothrombin Time: 16.6 seconds — ABNORMAL HIGH (ref 11.4–15.2)

## 2018-02-17 LAB — CBC WITH DIFFERENTIAL/PLATELET
BASOS ABS: 0 10*3/uL (ref 0.0–0.1)
BASOS PCT: 0 %
Eosinophils Absolute: 0 10*3/uL (ref 0.0–0.7)
Eosinophils Relative: 0 %
HEMATOCRIT: 30.8 % — AB (ref 39.0–52.0)
HEMOGLOBIN: 9.8 g/dL — AB (ref 13.0–17.0)
Lymphocytes Relative: 4 %
Lymphs Abs: 0.2 10*3/uL — ABNORMAL LOW (ref 0.7–4.0)
MCH: 30.6 pg (ref 26.0–34.0)
MCHC: 31.8 g/dL (ref 30.0–36.0)
MCV: 96.3 fL (ref 78.0–100.0)
Monocytes Absolute: 0.1 10*3/uL (ref 0.1–1.0)
Monocytes Relative: 1 %
NEUTROS ABS: 5.6 10*3/uL (ref 1.7–7.7)
Neutrophils Relative %: 95 %
Platelets: 231 10*3/uL (ref 150–400)
RBC: 3.2 MIL/uL — ABNORMAL LOW (ref 4.22–5.81)
RDW: 14.6 % (ref 11.5–15.5)
WBC: 5.9 10*3/uL (ref 4.0–10.5)

## 2018-02-17 LAB — I-STAT CG4 LACTIC ACID, ED
LACTIC ACID, VENOUS: 4.17 mmol/L — AB (ref 0.5–1.9)
Lactic Acid, Venous: 1.77 mmol/L (ref 0.5–1.9)

## 2018-02-17 LAB — URINALYSIS, ROUTINE W REFLEX MICROSCOPIC
Bilirubin Urine: NEGATIVE
GLUCOSE, UA: NEGATIVE mg/dL
KETONES UR: NEGATIVE mg/dL
Nitrite: POSITIVE — AB
PROTEIN: 30 mg/dL — AB
Specific Gravity, Urine: 1.01 (ref 1.005–1.030)
pH: 6 (ref 5.0–8.0)

## 2018-02-17 LAB — LACTIC ACID, PLASMA
LACTIC ACID, VENOUS: 1.2 mmol/L (ref 0.5–1.9)
Lactic Acid, Venous: 3.7 mmol/L (ref 0.5–1.9)

## 2018-02-17 LAB — PROCALCITONIN: Procalcitonin: 5.27 ng/mL

## 2018-02-17 LAB — TROPONIN I: TROPONIN I: 0.06 ng/mL — AB (ref <0.03)

## 2018-02-17 MED ORDER — PSYLLIUM 95 % PO PACK
1.0000 | PACK | Freq: Every day | ORAL | Status: DC
  Filled 2018-02-17 (×2): qty 1

## 2018-02-17 MED ORDER — ACETAMINOPHEN 325 MG PO TABS: 650.0000 mg | ORAL_TABLET | ORAL | Status: DC | PRN

## 2018-02-17 MED ORDER — ASPIRIN EC 81 MG PO TBEC
81.0000 mg | DELAYED_RELEASE_TABLET | Freq: Every day | ORAL | Status: DC
  Filled 2018-02-17 (×2): qty 1

## 2018-02-17 MED ORDER — SODIUM CHLORIDE 0.9 % IV BOLUS (SEPSIS)
500.0000 mL | Freq: Once | INTRAVENOUS | Status: AC
  Administered 2018-02-17: 500 mL via INTRAVENOUS

## 2018-02-17 MED ORDER — PSYLLIUM 0.52 G PO CAPS: 0.5200 g | ORAL_CAPSULE | Freq: Every day | ORAL | Status: DC

## 2018-02-17 MED ORDER — ATORVASTATIN CALCIUM 40 MG PO TABS: 40.0000 mg | ORAL_TABLET | Freq: Every evening | ORAL | Status: DC

## 2018-02-17 MED ORDER — PIPERACILLIN-TAZOBACTAM 3.375 G IVPB 30 MIN
3.3750 g | Freq: Once | INTRAVENOUS | Status: AC
  Administered 2018-02-17: 3.375 g via INTRAVENOUS
  Filled 2018-02-17: qty 50

## 2018-02-17 MED ORDER — ONDANSETRON HCL 4 MG/2ML IJ SOLN: 4.0000 mg | Freq: Four times a day (QID) | INTRAMUSCULAR | Status: DC | PRN

## 2018-02-17 MED ORDER — SODIUM CHLORIDE 0.9 % IV SOLN: 1000.0000 mL | INTRAVENOUS | Status: DC

## 2018-02-17 MED ORDER — LOPERAMIDE HCL 2 MG PO CAPS: 2.0000 mg | ORAL_CAPSULE | ORAL | Status: DC | PRN

## 2018-02-17 MED ORDER — ALUM & MAG HYDROXIDE-SIMETH 200-200-20 MG/5ML PO SUSP: 30.0000 mL | Freq: Every day | ORAL | Status: DC | PRN

## 2018-02-17 MED ORDER — SODIUM CHLORIDE 0.9 % IV SOLN
1000.0000 mL | INTRAVENOUS | Status: DC
  Administered 2018-02-17: 1000 mL via INTRAVENOUS

## 2018-02-17 MED ORDER — ONDANSETRON HCL 4 MG PO TABS: 4.0000 mg | ORAL_TABLET | Freq: Four times a day (QID) | ORAL | Status: DC | PRN

## 2018-02-17 MED ORDER — VANCOMYCIN HCL IN DEXTROSE 1-5 GM/200ML-% IV SOLN
1000.0000 mg | Freq: Once | INTRAVENOUS | Status: AC
  Administered 2018-02-17: 1000 mg via INTRAVENOUS
  Filled 2018-02-17: qty 200

## 2018-02-17 MED ORDER — PIPERACILLIN-TAZOBACTAM 3.375 G IVPB
3.3750 g | Freq: Three times a day (TID) | INTRAVENOUS | Status: DC
  Administered 2018-02-18 (×2): 3.375 g via INTRAVENOUS
  Filled 2018-02-17 (×2): qty 50

## 2018-02-17 MED ORDER — SODIUM CHLORIDE 0.9 % IV BOLUS (SEPSIS)
1000.0000 mL | Freq: Once | INTRAVENOUS | Status: AC
  Administered 2018-02-17: 1000 mL via INTRAVENOUS

## 2018-02-17 MED ORDER — PANTOPRAZOLE SODIUM 40 MG PO TBEC: 40.0000 mg | DELAYED_RELEASE_TABLET | Freq: Every day | ORAL | Status: DC

## 2018-02-17 MED ORDER — SODIUM CHLORIDE 0.9 % IV BOLUS
1000.0000 mL | Freq: Once | INTRAVENOUS | Status: AC
  Administered 2018-02-17: 1000 mL via INTRAVENOUS

## 2018-02-17 MED ORDER — ACETAMINOPHEN 325 MG PO TABS: 650.0000 mg | ORAL_TABLET | Freq: Four times a day (QID) | ORAL | Status: DC | PRN

## 2018-02-17 MED ORDER — GUAIFENESIN 100 MG/5ML PO SOLN
200.0000 mg | Freq: Four times a day (QID) | ORAL | Status: DC | PRN
  Filled 2018-02-17: qty 10

## 2018-02-17 MED ORDER — VANCOMYCIN HCL 500 MG IV SOLR
500.0000 mg | INTRAVENOUS | Status: DC
  Filled 2018-02-17: qty 500

## 2018-02-17 MED ORDER — ENOXAPARIN SODIUM 30 MG/0.3ML ~~LOC~~ SOLN
30.0000 mg | SUBCUTANEOUS | Status: DC
  Administered 2018-02-18: 30 mg via SUBCUTANEOUS
  Filled 2018-02-17: qty 0.3

## 2018-02-17 MED ORDER — ACETAMINOPHEN 650 MG RE SUPP
RECTAL | Status: AC
  Administered 2018-02-17: 650 mg
  Filled 2018-02-17: qty 1

## 2018-02-17 MED ORDER — SODIUM CHLORIDE 0.9 % IV SOLN
INTRAVENOUS | Status: DC
  Administered 2018-02-17 – 2018-02-18 (×2): via INTRAVENOUS

## 2018-02-17 MED ORDER — SODIUM CHLORIDE 0.9 % IV BOLUS (SEPSIS)
250.0000 mL | Freq: Once | INTRAVENOUS | Status: AC
  Administered 2018-02-17: 250 mL via INTRAVENOUS

## 2018-02-17 MED ORDER — ACETAMINOPHEN 650 MG RE SUPP: 650.0000 mg | Freq: Four times a day (QID) | RECTAL | Status: DC | PRN

## 2018-02-17 NOTE — ED Provider Notes (Signed)
[facility] EMERGENCY DEPARTMENT Provider Note   CSN: [id number] Arrival date & time: 02/17/18  1624     History   Chief Complaint Chief Complaint  Patient presents with  . Weakness    HPI Chad Everett is a 84 y.o. male.  The history is provided by the EMS personnel, the nursing home and medical records. The history is limited by the condition of the patient (hx dementia, non-verbal baseline).  Weakness    Pt was seen at 1635. Per EMS and NH report:  NH reports pt has had "increased weakness" and "decreased LOC" for unknown period of time. Pt was admitted 2 weeks ago for sepsis due to UTI and dehydration. Pt's baseline is bedbound, poor functional status, minimally communicative. During last admission: "The patient showed some clinical improvement with his mental status, but he remained poorly communicative.  The patient refused to eat and had poor oral intake.  Palliative medicine was consulted.  After discussing with social services, decision was made to change the patient to DNR status.  The patient will be ultimately discharged back to his nursing facility with hospice services."    Past Medical History:  Diagnosis Date  . Anxiety   . Asthma   . Bedbound   . Dementia   . DNR (do not resuscitate) 02/01/2018  . GERD (gastroesophageal reflux disease)   . Hyperlipidemia   . Hypertension   . Pneumonia     Patient Active Problem List   Diagnosis Date Noted  . Encounter for hospice care discussion   . Protein-calorie malnutrition, severe 01/30/2018  . Dehydration   . Urinary tract infection without hematuria   . Goals of care, counseling/discussion   . Palliative care by specialist   . DNR (do not resuscitate) discussion   . Sepsis secondary to UTI (HCC) 01/27/2018  . Hypernatremia 01/27/2018  . Lactic acidosis 01/27/2018  . S/P ORIF (open reduction internal fixation) fracture right hip cannulated screw placement 11/19/17 12/03/2017  . Pressure injury of skin 11/21/2017   . Malnutrition of moderate degree 11/19/2017  . Closed right hip fracture, initial encounter (HCC) 11/18/2017  . Hyperlipidemia 11/18/2017  . Encephalopathy 10/22/2014  . Acute dyspnea 10/21/2014  . HCAP (healthcare-associated pneumonia) 10/21/2014  . Diastolic heart failure (HCC) 10/21/2014  . Aspiration pneumonia (HCC) 08/13/2014  . COPD (chronic obstructive pulmonary disease) (HCC) 08/13/2014  . Acute respiratory failure with hypoxia (HCC) 08/13/2014  . Hypokalemia 08/13/2014  . Chronic diastolic congestive heart failure (HCC) 08/13/2014  . Weakness 08/11/2014  . Near syncope 05/17/2013  . Syncope 05/17/2013  . Dementia   . GERD (gastroesophageal reflux disease)   . Anxiety     Past Surgical History:  Procedure Laterality Date  . HIP PINNING,CANNULATED Right 11/19/2017   Procedure: INTERNAL FIXATION RIGHT HIP WITH DEPUY ACE CANNULATED SCREWS;  Surgeon: Vickki Hearing, MD;  Location: [facility];  Service: Orthopedics;  Laterality: Right;        Home Medications    Prior to Admission medications   Medication Sig Start Date End Date Taking? Authorizing Provider  acetaminophen (TYLENOL) 325 MG tablet Take 650 mg by mouth every 4 (four) hours as needed for moderate pain.   Yes [provider]  alum & mag hydroxide-simeth (MINTOX) 200-200-20 MG/5ML suspension Take 30 mLs by mouth daily as needed for indigestion or heartburn.   Yes [provider]  aspirin 81 MG tablet Take 81 mg by mouth daily.   Yes [provider]  atorvastatin (LIPITOR) 40 MG tablet  Take 40 mg by mouth every evening.    Yes [provider]  Coenzyme Q10 (CO Q-10 PO) Take 1 capsule by mouth daily.    Yes [provider]  guaifenesin (ROBITUSSIN) 100 MG/5ML syrup Take 200 mg by mouth every 6 (six) hours as needed for cough.   Yes [provider]  loperamide (IMODIUM) 2 MG capsule Take 2 mg by mouth as needed for diarrhea or loose stools.   Yes [provider]  Nutritional Supplements (NUTRITIONAL SHAKE PO) Take 120 mLs by mouth 2 (two) times daily.   Yes [provider]  omeprazole (PRILOSEC) 40 MG capsule Take 40 mg by mouth daily.    Yes [provider]  psyllium (REGULOID) 0.52 g capsule Take 0.52 g by mouth daily.   Yes [provider]    Family History Family History  Problem Relation Age of Onset  . Stroke Sister     Social History Social History   Tobacco Use  . Smoking status: Former Smoker    Packs/day: 0.50    Years: 20.00    Pack years: 10.00    Types: Cigarettes    Last attempt to quit: 08/12/1983    Years since quitting: 34.5  . Smokeless tobacco: Never Used  Substance Use Topics  . Alcohol use: No  . Drug use: No     Allergies   Onion   Review of Systems Review of Systems  Unable to perform ROS: Dementia  Neurological: Positive for weakness.     Physical Exam Updated Vital Signs BP (!) 85/41   Pulse (!) 117   Temp (!) 100.4 F (38 C)   Resp (!) 25   SpO2 96%    Patient Vitals for the past 24 hrs:  BP Temp Temp src Pulse Resp SpO2  02/17/18 1750 (!) 85/41 (!) 100.4 F (38 C) - - - -  02/17/18 1730 (!) 73/39 (!) 101.1 F (38.4 C) - - - -  02/17/18 1700 (!) 84/46 (!) 102.7 F (39.3 C) - (!) 117 (!) 25 96 %  02/17/18 1633 - (!) 103 F (39.4 C) Oral (!) 120 - -  02/17/18 1629 - (!) (P) 103 F (39.4 C) (P) Oral (!) (P) 120 - -     Physical Exam 1640: Physical examination:  Nursing notes reviewed; Vital signs and O2 SAT reviewed;  Constitutional: Cachetic. In no acute distress; Head:  Normocephalic, atraumatic; Eyes: EOMI, PERRL, No scleral icterus; ENMT: Mouth and pharynx normal, Mucous membranes dry; Neck: Supple, Full range of motion, No lymphadenopathy; Cardiovascular: Tachycardic rate and rhythm, No gallop; Respiratory: Breath sounds clear & equal bilaterally, No wheezes. Speaking full sentences with ease, Normal respiratory effort/excursion; Chest:  Nontender, Movement normal; Abdomen: Soft, Nontender, Nondistended, Normal bowel sounds; Genitourinary: No CVA tenderness; Spine:  No midline CS, TS, LS tenderness.;; Extremities: Peripheral pulses normal, No tenderness, No edema, No calf edema or asymmetry.; Neuro: Laying left side in fetal position, eyes closed. Pt essentially non-verbal at baseline. Bilat LE's contracted.; Skin: Color normal, Warm, Dry, multiple scattered areas of skin breakdown with covering DSD's.    ED Treatments / Results  Labs (all labs ordered are listed, but only abnormal results are displayed)   EKG EKG Interpretation  Date/Time:  Wednesday February 17 2018 16:53:50 EDT Ventricular Rate:  118 PR Interval:    QRS Duration: 93 QT Interval:  302 QTC Calculation: 424 R Axis:   80 Text Interpretation:  Sinus tachycardia Probable anteroseptal infarct, old Borderline repolarization  abnormality Baseline wander When compared with ECG of 11/19/2017 Rate faster Confirmed by Samuel Jester [phone]) on 02/17/2018 4:56:06 PM   Radiology   Procedures Procedures (including critical care time)  Medications Ordered in ED Medications  sodium chloride 0.9 % bolus 1,000 mL (1,000 mLs Intravenous New Bag/Given 02/17/18 1656)    And  sodium chloride 0.9 % bolus 500 mL (has no administration in time range)    And  sodium chloride 0.9 % bolus 250 mL (has no administration in time range)  vancomycin (VANCOCIN) IVPB 1000 mg/200 mL premix (1,000 mg Intravenous New Bag/Given 02/17/18 1657)  0.9 %  sodium chloride infusion (has no administration in time range)  sodium chloride 0.9 % bolus 1,000 mL (has no administration in time range)  acetaminophen (TYLENOL) 650 MG suppository (650 mg  Given 02/17/18 1636)  piperacillin-tazobactam (ZOSYN) IVPB 3.375 g (3.375 g Intravenous New Bag/Given 02/17/18 1656)     Initial Impression / Assessment and Plan / ED Course  I have reviewed the triage vital signs and the nursing notes.  Pertinent  labs & imaging results that were available during my care of the patient were reviewed by me and considered in my medical decision making (see chart for details).  MDM Reviewed: previous chart, nursing note and vitals Reviewed previous: labs and ECG Interpretation: labs, ECG and x-ray Total time providing critical care: 30-74 minutes. This excludes time spent performing separately reportable procedures and services. Consults: admitting MD   CRITICAL CARE Performed by: Laray Anger Total critical care time: 45 minutes Critical care time was exclusive of separately billable procedures and treating other patients. Critical care was necessary to treat or prevent imminent or life-threatening deterioration. Critical care was time spent personally by me on the following activities: development of treatment plan with patient and/or surrogate as well as nursing, discussions with consultants, evaluation of patient's response to treatment, examination of patient, obtaining history from patient or surrogate, ordering and performing treatments and interventions, ordering and review of laboratory studies, ordering and review of radiographic studies, pulse oximetry and re-evaluation of patient's condition.   Results for orders placed or performed during the hospital encounter of 02/17/18  Comprehensive metabolic panel  Result Value Ref Range   Sodium 139 135 - 145 mmol/L   Potassium 3.1 (L) 3.5 - 5.1 mmol/L   Chloride 104 98 - 111 mmol/L   CO2 22 22 - 32 mmol/L   Glucose, Bld 104 (H) 70 - 99 mg/dL   BUN 31 (H) 8 - 23 mg/dL   Creatinine, Ser 6.04 (H) 0.61 - 1.24 mg/dL   Calcium 7.7 (L) 8.9 - 10.3 mg/dL   Total Protein 7.1 6.5 - 8.1 g/dL   Albumin 2.2 (L) 3.5 - 5.0 g/dL   AST 22 15 - 41 U/L   ALT 14 0 - 44 U/L   Alkaline Phosphatase 158 (H) 38 - 126 U/L   Total Bilirubin 1.0 0.3 - 1.2 mg/dL   GFR calc non Af Amer 37 (L) >60 mL/min   GFR calc Af Amer 43 (L) >60 mL/min   Anion gap 13 5 - 15    CBC with Differential  Result Value Ref Range   WBC 5.9 4.0 - 10.5 K/uL   RBC 3.20 (L) 4.22 - 5.81 MIL/uL   Hemoglobin 9.8 (L) 13.0 - 17.0 g/dL   HCT 54.0 (L) 98.1 - 19.1 %   MCV 96.3 78.0 - 100.0 fL   MCH 30.6 26.0 - 34.0 pg   MCHC 31.8 30.0 -  36.0 g/dL   RDW 16.1 09.6 - 04.5 %   Platelets 231 150 - 400 K/uL   Neutrophils Relative % 95 %   Neutro Abs 5.6 1.7 - 7.7 K/uL   Lymphocytes Relative 4 %   Lymphs Abs 0.2 (L) 0.7 - 4.0 K/uL   Monocytes Relative 1 %   Monocytes Absolute 0.1 0.1 - 1.0 K/uL   Eosinophils Relative 0 %   Eosinophils Absolute 0.0 0.0 - 0.7 K/uL   Basophils Relative 0 %   Basophils Absolute 0.0 0.0 - 0.1 K/uL  Protime-INR  Result Value Ref Range   Prothrombin Time 16.6 (H) 11.4 - 15.2 seconds   INR 1.36   Urinalysis, Routine w reflex microscopic  Result Value Ref Range   Color, Urine AMBER (A) YELLOW   APPearance CLOUDY (A) CLEAR   Specific Gravity, Urine 1.010 1.005 - 1.030   pH 6.0 5.0 - 8.0   Glucose, UA NEGATIVE NEGATIVE mg/dL   Hgb urine dipstick MODERATE (A) NEGATIVE   Bilirubin Urine NEGATIVE NEGATIVE   Ketones, ur NEGATIVE NEGATIVE mg/dL   Protein, ur 30 (A) NEGATIVE mg/dL   Nitrite POSITIVE (A) NEGATIVE   Leukocytes, UA LARGE (A) NEGATIVE   RBC / HPF 0-5 0 - 5 RBC/hpf   WBC, UA >50 (H) 0 - 5 WBC/hpf   Bacteria, UA FEW (A) NONE SEEN   Squamous Epithelial / LPF 0-5 0 - 5   WBC Clumps PRESENT    Non Squamous Epithelial 0-5 (A) NONE SEEN  Troponin I  Result Value Ref Range   Troponin I 0.06 (HH) <0.03 ng/mL  I-Stat CG4 Lactic Acid, ED  Result Value Ref Range   Lactic Acid, Venous 4.17 (HH) 0.5 - 1.9 mmol/L   Comment NOTIFIED PHYSICIAN    Dg Chest Portable 1 View Result Date: 02/17/2018 CLINICAL DATA:  84 y/o M; weakness, decreased level of consciousness. EXAM: PORTABLE CHEST 1 VIEW COMPARISON:  01/27/2018 chest radiograph FINDINGS: Stable normal cardiac silhouette given projection and technique. Aortic atherosclerosis with calcification.  Clear lungs. No pleural effusion or pneumothorax. No acute osseous abnormality is evident. IMPRESSION: No active disease. Electronically Signed   By: Mitzi Hansen M.D.   On: 02/17/2018 17:36    1750:  Code Sepsis called on pt's arrival. IVF 30mg /kg bolus ordered, as well as IV abx after BC and UC obtained.  Pt remains hypotensive despite IVF boluses, but tachycardia has improved. APAP given for fever with improvement. Pt's foley draining thick, tan urine with sediment. BUN/Cr elevated from previous. Pt appears clinically dehydrated. IVF boluses continue. Will not escalate further, considering decision during previous admission was DNR and hospice care. Will call on call [facility] to clarify.   1805:  Pt's contacts are [facility], Ms. Waddell called and left message. T/C returned from [facility] Ms. Waddell, case discussed, including:  HPI, pertinent PM/SHx, VS/PE, dx testing, ED course and treatment:  States pt's are transported as full code, but decision to make pt DNR is up to physician in charge of pt's care at the hospital and Social Services will honor that decision; I read excerpt above from pt's last admission, and she states it is OK to honor this previous discussion now and keeping in mind to "treat the treatable." She gave me the after hours contact number to call and ask for the on-call Social Worker to call back: [phone].   1820:  T/C returned from [facility] Onalee Hua, case discussed, including:  HPI, pertinent  PM/SHx, VS/PE, dx testing, ED course and treatment, as well as d/w [employer] above:  Agreeable to admit.     Final Clinical Impressions(s) / ED Diagnoses   Final diagnoses:  None    ED Discharge Orders    None       Samuel Jester, DO 02/18/18 1650

## 2018-02-17 NOTE — ED Notes (Signed)
Date and time results received: 02/17/18   Test: Troponin Critical Value: 0.06  Name of Provider Notified: Clarene DukeMcManus  Orders Received? Or Actions Taken?: No new orders at this time.

## 2018-02-17 NOTE — ED Notes (Signed)
Critical Result  02/17/2018 1714  CG4+  Result: 4.17   MD Notified: Clarene DukeMcManus

## 2018-02-17 NOTE — H&P (Signed)
History and Physical    Chad Everett WUJ:[id number] DOB: 15-Mar-1931 DOA: 02/17/2018  PCP: [facility]  Patient coming from: Skilled nursing facility  Chief Complaint: Weakness  HPI: Chad Everett is a 84 y.o. male with medical history significant of dementia, bedbound state, hypertension, frequent infections recently hospitalized a little over a week ago and after discussions with his social worker was made DNR and sent to the nursing home with hospice.  He was sent back in due to more weakness than usual.  Patient is bedbound and contracted.  Patient cannot provide any history due to his advanced dementia.  Apparently Dr. Clarene Duke called the social worker who reports that patient is only DNR while in the hospital and that this is undone when he was recently sent to the nursing home.  Hospice was never arranged.  Social work recommended that patient's CODE STATUS be DNR if his physicians are agreeable to futility of care.  Per notes from [facility] our palliative care consulted discussions were made to only treat the treatable in this patient.  His life expectancy due to poor oral intake was less than a couple of weeks.  Patient is found to be in septic shock with acute kidney injury and is referred for admission for such.  Review of Systems: Unobtainable from patient due to his advanced dementia  Past Medical History:  Diagnosis Date  . Anxiety   . Asthma   . Bedbound   . Dementia   . DNR (do not resuscitate) 02/01/2018  . GERD (gastroesophageal reflux disease)   . Hyperlipidemia   . Hypertension   . Pneumonia     Past Surgical History:  Procedure Laterality Date  . HIP PINNING,CANNULATED Right 11/19/2017   Procedure: INTERNAL FIXATION RIGHT HIP WITH DEPUY ACE CANNULATED SCREWS;  Surgeon: Vickki Hearing, MD;  Location: [facility];  Service: Orthopedics;  Laterality: Right;     reports that he quit smoking about 34 years ago. His smoking use included cigarettes. He has  a 10.00 pack-year smoking history. He has never used smokeless tobacco. He reports that he does not drink alcohol or use drugs.  Allergies  Allergen Reactions  . Onion Other (See Comments)    Indigestion    Family History  Problem Relation Age of Onset  . Stroke Sister     Prior to Admission medications   Medication Sig Start Date End Date Taking? Authorizing Provider  acetaminophen (TYLENOL) 325 MG tablet Take 650 mg by mouth every 4 (four) hours as needed for moderate pain.   Yes [provider]  alum & mag hydroxide-simeth (MINTOX) 200-200-20 MG/5ML suspension Take 30 mLs by mouth daily as needed for indigestion or heartburn.   Yes [provider]  aspirin 81 MG tablet Take 81 mg by mouth daily.   Yes [provider]  atorvastatin (LIPITOR) 40 MG tablet Take 40 mg by mouth every evening.    Yes [provider]  Coenzyme Q10 (CO Q-10 PO) Take 1 capsule by mouth daily.    Yes [provider]  guaifenesin (ROBITUSSIN) 100 MG/5ML syrup Take 200 mg by mouth every 6 (six) hours as needed for cough.   Yes [provider]  loperamide (IMODIUM) 2 MG capsule Take 2 mg by mouth as needed for diarrhea or loose stools.   Yes [provider]  Nutritional Supplements (NUTRITIONAL SHAKE PO) Take 120 mLs by mouth 2 (two) times daily.   Yes [provider]  omeprazole (PRILOSEC) 40  MG capsule Take 40 mg by mouth daily.    Yes [provider]  psyllium (REGULOID) 0.52 g capsule Take 0.52 g by mouth daily.   Yes [provider]    Physical Exam: Vitals:   02/17/18 1921 02/17/18 1930 02/17/18 1950 02/17/18 2000  BP: (!) 71/42 (!) 68/44 (!) 58/37 (!) 53/35  Pulse:      Resp:      Temp: 98.1 F (36.7 C) 97.9 F (36.6 C) 97.7 F (36.5 C) 97.9 F (36.6 C)  TempSrc:      SpO2:        Constitutional: NAD, calm, comfortable awake and contracted malnourished Vitals:   02/17/18 1921 02/17/18 1930 02/17/18 1950  02/17/18 2000  BP: (!) 71/42 (!) 68/44 (!) 58/37 (!) 53/35  Pulse:      Resp:      Temp: 98.1 F (36.7 C) 97.9 F (36.6 C) 97.7 F (36.5 C) 97.9 F (36.6 C)  TempSrc:      SpO2:       Eyes: PERRL, lids and conjunctivae normal ENMT: Mucous membranes are dry. Posterior pharynx clear of any exudate or lesions.Normal dentition.  Neck: normal, supple, no masses, no thyromegaly Respiratory: clear to auscultation bilaterally, no wheezing, no crackles. Normal respiratory effort. No accessory muscle use.  Cardiovascular: Regular rate and rhythm, no murmurs / rubs / gallops. No extremity edema. 2+ pedal pulses. No carotid bruits.  Abdomen: no tenderness, no masses palpated. No hepatosplenomegaly. Bowel sounds positive.  Musculoskeletal: no clubbing / cyanosis. No joint deformity upper and lower extremities.  with contractures.  Diminished muscle tone.  Skin: no rashes, lesions, ulcers. No induration Neurologic: CN 2-12 grossly intact.  Patient does not participate in any commands cannot follow commands due to advanced dementia  psychiatric: Not normal judgment and insight. Alert and oriented x 0.  Not agitated   Labs on Admission: I have personally reviewed following labs and imaging studies  CBC: Recent Labs  Lab 02/17/18 1645  WBC 5.9  NEUTROABS 5.6  HGB 9.8*  HCT 30.8*  MCV 96.3  PLT 231   Basic Metabolic Panel: Recent Labs  Lab 02/17/18 1645  NA 139  K 3.1*  CL 104  CO2 22  GLUCOSE 104*  BUN 31*  CREATININE 1.61*  CALCIUM 7.7*   GFR: CrCl cannot be calculated (Unknown ideal weight.). Liver Function Tests: Recent Labs  Lab 02/17/18 1645  AST 22  ALT 14  ALKPHOS 158*  BILITOT 1.0  PROT 7.1  ALBUMIN 2.2*   No results for input(s): LIPASE, AMYLASE in the last 168 hours. No results for input(s): AMMONIA in the last 168 hours. Coagulation Profile: Recent Labs  Lab 02/17/18 1645  INR 1.36   Cardiac Enzymes: Recent Labs  Lab 02/17/18 1646  TROPONINI 0.06*     BNP (last 3 results) No results for input(s): PROBNP in the last 8760 hours. HbA1C: No results for input(s): HGBA1C in the last 72 hours. CBG: No results for input(s): GLUCAP in the last 168 hours. Lipid Profile: No results for input(s): CHOL, HDL, LDLCALC, TRIG, CHOLHDL, LDLDIRECT in the last 72 hours. Thyroid Function Tests: No results for input(s): TSH, T4TOTAL, FREET4, T3FREE, THYROIDAB in the last 72 hours. Anemia Panel: No results for input(s): VITAMINB12, FOLATE, FERRITIN, TIBC, IRON, RETICCTPCT in the last 72 hours. Urine analysis:    Component Value Date/Time   COLORURINE AMBER (A) 02/17/2018 1700   APPEARANCEUR CLOUDY (A) 02/17/2018 1700   APPEARANCEUR Hazy 02/13/2012 1956   LABSPEC 1.010  02/17/2018 1700   LABSPEC 1.016 02/13/2012 1956   PHURINE 6.0 02/17/2018 1700   GLUCOSEU NEGATIVE 02/17/2018 1700   GLUCOSEU 150 mg/dL 16/10/960407/12/2011 54091956   HGBUR MODERATE (A) 02/17/2018 1700   BILIRUBINUR NEGATIVE 02/17/2018 1700   BILIRUBINUR Negative 02/13/2012 1956   KETONESUR NEGATIVE 02/17/2018 1700   PROTEINUR 30 (A) 02/17/2018 1700   UROBILINOGEN 0.2 10/22/2014 0932   NITRITE POSITIVE (A) 02/17/2018 1700   LEUKOCYTESUR LARGE (A) 02/17/2018 1700   LEUKOCYTESUR Negative 02/13/2012 1956   Sepsis Labs: !!!!!!!!!!!!!!!!!!!!!!!!!!!!!!!!!!!!!!!!!!!! @LABRCNTIP (procalcitonin:4,lacticidven:4) ) Recent Results (from the past 240 hour(s))  Culture, blood (Routine x 2)     Status: None (Preliminary result)   Collection Time: 02/17/18  4:45 PM  Result Value Ref Range Status   Specimen Description BLOOD RIGHT WRIST  Final   Special Requests   Final    BOTTLES DRAWN AEROBIC AND ANAEROBIC Blood Culture adequate volume Performed at [facility], [address] [place], [place] [address]    Culture PENDING  Incomplete   Report Status PENDING  Incomplete  Culture, blood (Routine x 2)     Status: None (Preliminary result)   Collection Time: 02/17/18  4:45 PM  Result Value Ref Range  Status   Specimen Description BLOOD RIGHT FOREARM  Final   Special Requests   Final    BOTTLES DRAWN AEROBIC AND ANAEROBIC Blood Culture adequate volume Performed at [facility], [address] [place], [place] [address]    Culture PENDING  Incomplete   Report Status PENDING  Incomplete     Radiological Exams on Admission: Dg Chest Portable 1 View  Result Date: 02/17/2018 CLINICAL DATA:  84 y/o M; weakness, decreased level of consciousness. EXAM: PORTABLE CHEST 1 VIEW COMPARISON:  01/27/2018 chest radiograph FINDINGS: Stable normal cardiac silhouette given projection and technique. Aortic atherosclerosis with calcification. Clear lungs. No pleural effusion or pneumothorax. No acute osseous abnormality is evident. IMPRESSION: No active disease. Electronically Signed   By: Mitzi HansenLance  Furusawa-Stratton M.D.   On: 02/17/2018 17:36    EKG: Independently reviewed.  Sinus tachycardia no acute changes  Old chart reviewed  Case discussed with Dr. Clarene DukeMcManus in the ED  Assessment/Plan 84 year old male with advanced dementia, bedbound state, contractures comes in with septic shock and acute kidney injury Principal Problem:   Septic shock (HCC)-placed on IV vancomycin and Zosyn.  Source unclear but urine is grossly dirty.  Obtain urine culture and blood cultures.  Follow-up on culture data.  IV fluids overnight.  If patient does not respond in the first 24 hours would consider moving towards more of comfort measures at that time.  Patient is DNR.  Active Problems:   Dementia-advanced and noted   COPD (chronic obstructive pulmonary disease) (HCC)-stable at this time   Chronic diastolic congestive heart failure (HCC)-stable at this time   Dehydration-IV fluids   AKI (acute kidney injury) (HCC)-creatinine bumped up to 1.7.  Due to septic shock   Patient with extremely overall very poor prognosis.  Not quite clear why hospice was not set up for him at the skilled nursing facility.  According to social  work conversation with Dr. Clarene DukeMcManus in the ED it sounds like decisions that were made in the hospital are not legally honored once he leaves the hospital which does not make much sense to me.  I am unaware of this legal issue and have never heard of it.  If indeed hospice cannot be set up for patient in the skilled nursing facility environment he would highly benefit  then from specific inpatient status to a hospice house if he survives this hospitalization that specializes in providing patient's end-of-life care and a death with dignity.   DVT prophylaxis: SCDs Code Status: DNR Family Communication: None Disposition Plan: Likely over 48 hours from now if survives this hospitalization Consults called: None Admission status: Admission   DAVID,RACHAL A MD [facility]  If 7PM-7AM, please contact night-coverage [email or web address] Password [facility]  02/17/2018, 8:28 PM

## 2018-02-17 NOTE — ED Triage Notes (Signed)
Pt released last week for UTI/Sepsis. [facility], per facility pt has increased weakness,decreased LOC.

## 2018-02-17 NOTE — Progress Notes (Signed)
Pharmacy Antibiotic Note  Chad Everett is a 84 y.o. male admitted on 02/17/2018 with sepsis.  Pharmacy has been consulted for Vancomycin and Zosyn dosing.  Plan: Vancomycin 1000 mg IV x 1 dose Vancomycin 500 mg IV every 24 hours.  Goal trough 15-20 mcg/mL. Zosyn 3.375g IV q8h (4 hour infusion).  Monitor labs, c/s, and vanco trough as indicated     Temp (24hrs), Avg:100.1 F (37.8 C), Min:97.7 F (36.5 C), Max:103 F (39.4 C)  Recent Labs  Lab 02/17/18 1645 02/17/18 1710 02/17/18 1909  WBC 5.9  --   --   CREATININE 1.61*  --   --   LATICACIDVEN  --  4.17* 1.77    CrCl cannot be calculated (Unknown ideal weight.).    Allergies  Allergen Reactions  . Onion Other (See Comments)    Indigestion    Antimicrobials this admission: Vanco 7/10 >>  Zosyn 7/10 >>   Dose adjustments this admission: N/A  Microbiology results: 7/10 BCx: pending 7/10 UCx: pending     Thank you for allowing pharmacy to be a part of this patient's care.  Tad MooreSteven C Gianluca Chhim 02/17/2018 8:05 PM

## 2018-02-17 NOTE — ED Notes (Signed)
After hours number for Child psychotherapistsocial worker (APS). [phone]

## 2018-02-18 DIAGNOSIS — A419 Sepsis, unspecified organism: Principal | ICD-10-CM

## 2018-02-18 DIAGNOSIS — N179 Acute kidney failure, unspecified: Secondary | ICD-10-CM

## 2018-02-18 DIAGNOSIS — R6521 Severe sepsis with septic shock: Secondary | ICD-10-CM

## 2018-02-18 LAB — BLOOD CULTURE ID PANEL (REFLEXED)
Acinetobacter baumannii: NOT DETECTED
CANDIDA ALBICANS: NOT DETECTED
CANDIDA GLABRATA: NOT DETECTED
CANDIDA TROPICALIS: NOT DETECTED
Candida krusei: NOT DETECTED
Candida parapsilosis: NOT DETECTED
Carbapenem resistance: NOT DETECTED
ENTEROBACTER CLOACAE COMPLEX: NOT DETECTED
Enterobacteriaceae species: DETECTED — AB
Enterococcus species: NOT DETECTED
Escherichia coli: DETECTED — AB
HAEMOPHILUS INFLUENZAE: NOT DETECTED
Klebsiella oxytoca: NOT DETECTED
Klebsiella pneumoniae: NOT DETECTED
Listeria monocytogenes: NOT DETECTED
Neisseria meningitidis: NOT DETECTED
PROTEUS SPECIES: NOT DETECTED
Pseudomonas aeruginosa: NOT DETECTED
STREPTOCOCCUS SPECIES: NOT DETECTED
Serratia marcescens: NOT DETECTED
Staphylococcus aureus (BCID): NOT DETECTED
Staphylococcus species: NOT DETECTED
Streptococcus agalactiae: NOT DETECTED
Streptococcus pneumoniae: NOT DETECTED
Streptococcus pyogenes: NOT DETECTED

## 2018-02-18 LAB — GLUCOSE, CAPILLARY: Glucose-Capillary: 85 mg/dL (ref 70–99)

## 2018-02-18 MED ORDER — MORPHINE SULFATE (CONCENTRATE) 10 MG/0.5ML PO SOLN
2.5000 mg | ORAL | Status: DC | PRN
Start: 2018-02-18 — End: 2018-02-19

## 2018-02-18 NOTE — Clinical Social Work Note (Signed)
Per attending's request, referral was made to [facility]. LCSW confirmed with guardian, Rolly PancakeLucinda Wilson, [place] [facility] to [facility].     Gus Littler, Juleen ChinaHeather D, LCSW

## 2018-02-18 NOTE — Progress Notes (Signed)
Full note to follow. Discussed case with Weldon InchesLucinda Wilson with [facility] [phone]). Plan to de-escalate care and transition to residential hospice once bed is available.  Peggye PittEstela Hernandez, MD [facility] Pager: [phone]

## 2018-02-18 NOTE — Progress Notes (Signed)
Nutrition Brief Note  Chart reviewed. Patient is an 84 yo who presents with sepsis and advanced dementia. Pt now transitioning to comfort care. Plans are to transfer to Hospice Home.  No further nutrition interventions warranted at this time.  Please re-consult as needed.     Royann ShiversLynn Jenean Escandon MS,RD,CSG,LDN Office: [phone] Pager: [phone]

## 2018-02-18 NOTE — Clinical Social Work Note (Signed)
Patient Information   Patient Name Chad Everett, Chad Everett ([id number]) Sex Male DOB 02/25/1931 SSN [id number]  Room Bed  IC03 IC03-01  Patient Demographics   Address (Temporary) [address] [place] [address] Contact Numbers (Temporary) [phone] E-mail Address [email or web address]  Patient Ethnicity & Race   Ethnic Group Patient Race  Not Hispanic or Latino White or Caucasian  Emergency Contact(s)   Name Relation Home Work [place] Legal Guardian [phone]  [phone]  Chad Everett, Chad Everett [phone]    Chad Everett, Chad Everett [phone]    Chad Everett, Chad Everett [phone]  [phone]  Documents on File    Status Date Received Description  Documents for the Patient  [place] HIPAA NOTICE OF PRIVACY - Scanned Not Received    [place] E-Signature HIPAA Notice of Privacy Received 10/21/14   [place] E-Signature HIPAA Notice of Privacy Spanish Not Received    Driver's License Not Received    Insurance Card Received 12/03/17 Medicare/Medicaid//ROSM  Advance Directives/Living Will/HCPOA/POA Received 12/08/17 Guardianship document/[place] DSS  Financial Application Not Received    Guardianship Documents  10/26/14   HIM ROI Authorization (Expired) 10/30/14 The Clerk of Court needs status report. Need medical records from October 21, 2014 to discharge.  Other Photo ID Not Received    Release of Information  11/02/14   HIM ROI Authorization  07/22/16 NO AUTHO  HIM ROI Authorization (Expired) 08/18/16 [facility]  HIM ROI Authorization  11/23/17 [name] [place]  Advanced Beneficiary Notice (ABN) Not Received    E-Signature AOB Spanish Not Received    HIM ROI Authorization  11/30/17 [place]  Insurance Card Received 12/03/17 Pt didn't have ins cards with him//ROSM  Release of Information Received 12/08/17 [facility]  [employer] Card Received    AMB Correspondence  Received 12/08/17 PHYSICIAN'S PROGRESS NOTE HARRISON MD, S  AMB Provider Completed Forms Received 12/03/17 PHYSICIAN'S PROGRESS NOTE HARRISON MD, S  AMB Correspondence Received 02/03/18 PHYSICIANS ORDER SHEET  AMB Correspondence Received 02/03/18 PHYSICIANS PROGRESS NOTES  HIM Release of Information Output (Deleted) 11/23/17 Requested records  Documents for the Encounter  AOB (Assignment of Insurance Benefits) Not Received    E-signature AOB     MEDICARE RIGHTS Received 02/17/18 Unable to sign due to condition  E-signature Medicare Rights     AOB (Assignment of Insurance Benefits) Received 02/17/18 Unable to sign due to condition  Cardiac Monitoring Strip Shift Summary Received 02/17/18   Cardiac Monitoring Strip Received 02/18/18   EKG Received 02/18/18   Admission Information   Attending Provider Admitting Provider Admission Type Admission Date/Time  Philip Aspen, Limmie Patricia, MD Haydee Monica, MD Emergency 02/17/18 1624  Discharge Date Hospital Service Auth/Cert Status Service Area   Family Medicine Incomplete [facility]  Unit Room/Bed Admission Status   AP-ICCUP NURSING IC03/IC03-01 Admission (Confirmed)   Admission   Complaint  ---  Hospital Account   Name Acct ID Class Status Primary Coverage  Jevon, Shells [id number] Inpatient Open MEDICARE - MEDICARE PART A AND B      Guarantor Account (for Hospital Account [id number])   Name Relation to Pt Service Area Active? Acct Type  Chad Everett Self CHSA Yes Personal/Family  Address Phone    [address] [place], [place] [address] [phone](H)        Coverage Information (for Hospital Account [id number])   1. MEDICARE/MEDICARE PART A AND B   F/O Payor/Plan Precert #  MEDICARE/MEDICARE PART A AND B  Subscriber Subscriber #  Chad Derrickerry, Byrant [id number]  Address Phone  [address] [place], [place] [phone]   2. MEDICAID [place]/MEDICAID OF [place]   F/O Payor/Plan Precert #  MEDICAID [place]/MEDICAID OF [place]    Subscriber Subscriber #  Chad Derrickerry, Trea [id number]  Address Phone  [address] [address] [place], [place] [address] [phone]

## 2018-02-18 NOTE — Progress Notes (Signed)
PROGRESS NOTE    Chad Everett  ZOX:[id number] DOB: July 09, 1931 DOA: 02/17/2018 [facility]     Brief Narrative:  84 year old man admitted from skilled nursing facility on 7/10 due to weakness.  He has had decreased oral intake.  He was found to have very low blood pressures and to be in septic shock with acute kidney injury and admission was requested.  Review of notes from prior hospitalization indicate that patient should have been a DNR and should have been transitioned to hospice upon discharge, this never occurred.  After discussion with his [place] DSS representative, we have transitioned him to comfort care and he is currently awaiting placement at residential hospice.   Assessment & Plan:   Principal Problem:   Septic shock (HCC) Active Problems:   Dementia   COPD (chronic obstructive pulmonary disease) (HCC)   Chronic diastolic congestive heart failure (HCC)   Dehydration   AKI (acute kidney injury) (HCC)   Septic shock/gram-negative bacteremia -Patient is transitioning to comfort care due to his extremely overall poor prognosis.  It is unclear the reasons as to why hospice was not set up after discharge. -Patient is currently a ward of the state and after discussion with patient's DSS representative we have transitioned him to comfort care status.  All antibiotics have been discontinued, comfort medications have been ordered.  Advanced dementia Acute renal failure, due to ongoing sepsis   DVT prophylaxis: None Code Status: DNR Family Communication: Discussion with patient's DSS representative, Weldon InchesLucinda Wilson on 7/11 Disposition Plan: Residential hospice once bed becomes available.  If bed is not available will need to transition to GIP.  Consultants:   None  Procedures:   None  Antimicrobials:  Anti-infectives (From admission, onward)   Start     Dose/Rate Route Frequency Ordered Stop   02/18/18 1700  vancomycin (VANCOCIN)  500 mg in sodium chloride 0.9 % 100 mL IVPB  Status:  Discontinued     500 mg 100 mL/hr over 60 Minutes Intravenous Every 24 hours 02/17/18 2004 02/18/18 1116   02/18/18 0100  piperacillin-tazobactam (ZOSYN) IVPB 3.375 g  Status:  Discontinued     3.375 g 12.5 mL/hr over 240 Minutes Intravenous Every 8 hours 02/17/18 2004 02/18/18 1116   02/17/18 1645  piperacillin-tazobactam (ZOSYN) IVPB 3.375 g     3.375 g 100 mL/hr over 30 Minutes Intravenous  Once 02/17/18 1644 02/17/18 1726   02/17/18 1645  vancomycin (VANCOCIN) IVPB 1000 mg/200 mL premix     1,000 mg 200 mL/hr over 60 Minutes Intravenous  Once 02/17/18 1644 02/17/18 1757       Subjective: Lying in bed, not interactive, appears very frail and cachectic.  Objective: Vitals:   02/18/18 1300 02/18/18 1400 02/18/18 1500 02/18/18 1744  BP: (!) 114/58 116/60 123/68 (!) 117/59  Pulse:    82  Resp:    16  Temp: 98.2 F (36.8 C) 98.4 F (36.9 C) 98.4 F (36.9 C) 98.4 F (36.9 C)  TempSrc:    Oral  SpO2:    99%  Weight:      Height:        Intake/Output Summary (Last 24 hours) at 02/18/2018 1815 Last data filed at 02/18/2018 1500 Gross per 24 hour  Intake 4585 ml  Output -  Net 4585 ml   Filed Weights   02/17/18 2040  Weight: 52.5 kg (115 lb 11.9 oz)    Examination:  General exam: Cachectic, withdrawn, not interactive Respiratory system: Clear to  auscultation. Respiratory effort normal. Cardiovascular system:RRR. No murmurs, rubs, gallops. Gastrointestinal system: Abdomen is nondistended, soft and nontender. No organomegaly or masses felt. Normal bowel sounds heard. Central nervous system: Unable to assess given current mental state Extremities: No C/C/E, +pedal pulses Psychiatry: Unable to assess given current mental state    Data Reviewed: I have personally reviewed following labs and imaging studies  CBC: Recent Labs  Lab 02/17/18 1645  WBC 5.9  NEUTROABS 5.6  HGB 9.8*  HCT 30.8*  MCV 96.3  PLT 231     Basic Metabolic Panel: Recent Labs  Lab 02/17/18 1645  NA 139  K 3.1*  CL 104  CO2 22  GLUCOSE 104*  BUN 31*  CREATININE 1.61*  CALCIUM 7.7*   GFR: Estimated Creatinine Clearance: 24.9 mL/min (A) (by C-G formula based on SCr of 1.61 mg/dL (H)). Liver Function Tests: Recent Labs  Lab 02/17/18 1645  AST 22  ALT 14  ALKPHOS 158*  BILITOT 1.0  PROT 7.1  ALBUMIN 2.2*   No results for input(s): LIPASE, AMYLASE in the last 168 hours. No results for input(s): AMMONIA in the last 168 hours. Coagulation Profile: Recent Labs  Lab 02/17/18 1645  INR 1.36   Cardiac Enzymes: Recent Labs  Lab 02/17/18 1646  TROPONINI 0.06*   BNP (last 3 results) No results for input(s): PROBNP in the last 8760 hours. HbA1C: No results for input(s): HGBA1C in the last 72 hours. CBG: Recent Labs  Lab 02/18/18 0734  GLUCAP 85   Lipid Profile: No results for input(s): CHOL, HDL, LDLCALC, TRIG, CHOLHDL, LDLDIRECT in the last 72 hours. Thyroid Function Tests: No results for input(s): TSH, T4TOTAL, FREET4, T3FREE, THYROIDAB in the last 72 hours. Anemia Panel: No results for input(s): VITAMINB12, FOLATE, FERRITIN, TIBC, IRON, RETICCTPCT in the last 72 hours. Urine analysis:    Component Value Date/Time   COLORURINE AMBER (A) 02/17/2018 1700   APPEARANCEUR CLOUDY (A) 02/17/2018 1700   APPEARANCEUR Hazy 02/13/2012 1956   LABSPEC 1.010 02/17/2018 1700   LABSPEC 1.016 02/13/2012 1956   PHURINE 6.0 02/17/2018 1700   GLUCOSEU NEGATIVE 02/17/2018 1700   GLUCOSEU 150 mg/dL 16/05/9603 5409   HGBUR MODERATE (A) 02/17/2018 1700   BILIRUBINUR NEGATIVE 02/17/2018 1700   BILIRUBINUR Negative 02/13/2012 1956   KETONESUR NEGATIVE 02/17/2018 1700   PROTEINUR 30 (A) 02/17/2018 1700   UROBILINOGEN 0.2 10/22/2014 0932   NITRITE POSITIVE (A) 02/17/2018 1700   LEUKOCYTESUR LARGE (A) 02/17/2018 1700   LEUKOCYTESUR Negative 02/13/2012 1956   Sepsis  Labs: @LABRCNTIP (procalcitonin:4,lacticidven:4)  ) Recent Results (from the past 240 hour(s))  Culture, blood (Routine x 2)     Status: None (Preliminary result)   Collection Time: 02/17/18  4:45 PM  Result Value Ref Range Status   Specimen Description BLOOD RIGHT WRIST  Final   Special Requests   Final    BOTTLES DRAWN AEROBIC AND ANAEROBIC Blood Culture adequate volume   Culture  Setup Time   Final    BOTH BOTTLES GRAM NEGATIVE RODS Gram Stain Report Called to,Read Back By and Verified With: Marlowe Sax 02/18/18 @ 0910 [email or web address] Organism ID to follow Performed at [facility], [address], [place], [place] [address]    Culture PENDING  Incomplete   Report Status PENDING  Incomplete  Culture, blood (Routine x 2)     Status: None (Preliminary result)   Collection Time: 02/17/18  4:45 PM  Result Value Ref Range Status   Specimen Description   Final    BLOOD RIGHT  FOREARM Performed at [facility], [address], [place], [place] [address]    Special Requests   Final    BOTTLES DRAWN AEROBIC AND ANAEROBIC Blood Culture adequate volume Performed at [facility], [address] [place], [place] [address]    Culture  Setup Time   Final    AEROBIC BOTTLE GRAM NEGATIVE RODS Gram Stain Report Called to,Read Back By and Verified With: Telford Nab 02/18/18 @ 1203 LSCHMIDT Performed at [facility], [address], [place], [place] [address]    Culture GRAM NEGATIVE RODS  Final   Report Status PENDING  Incomplete  Blood Culture ID Panel (Reflexed)     Status: Abnormal   Collection Time: 02/17/18  4:45 PM  Result Value Ref Range Status   Enterococcus species NOT DETECTED NOT DETECTED Final   Listeria monocytogenes NOT DETECTED NOT DETECTED Final   Staphylococcus species NOT DETECTED NOT DETECTED Final   Staphylococcus aureus NOT DETECTED NOT DETECTED Final   Streptococcus species NOT DETECTED NOT DETECTED Final   Streptococcus agalactiae NOT DETECTED NOT DETECTED Final    Streptococcus pneumoniae NOT DETECTED NOT DETECTED Final   Streptococcus pyogenes NOT DETECTED NOT DETECTED Final   Acinetobacter baumannii NOT DETECTED NOT DETECTED Final   Enterobacteriaceae species DETECTED (A) NOT DETECTED Final    Comment: Enterobacteriaceae represent a large family of gram-negative bacteria, not a single organism. CRITICAL RESULT CALLED TO, READ BACK BY AND VERIFIED WITH: Wallie Renshaw PharmD 12:40 02/18/18 ([email or web address])    Enterobacter cloacae complex NOT DETECTED NOT DETECTED Final   Escherichia coli DETECTED (A) NOT DETECTED Final    Comment: CRITICAL RESULT CALLED TO, READ BACK BY AND VERIFIED WITH: Wallie Renshaw PharmD 12:40 02/18/18 ([email or web address])    Klebsiella oxytoca NOT DETECTED NOT DETECTED Final   Klebsiella pneumoniae NOT DETECTED NOT DETECTED Final   Proteus species NOT DETECTED NOT DETECTED Final   Serratia marcescens NOT DETECTED NOT DETECTED Final   Carbapenem resistance NOT DETECTED NOT DETECTED Final   Haemophilus influenzae NOT DETECTED NOT DETECTED Final   Neisseria meningitidis NOT DETECTED NOT DETECTED Final   Pseudomonas aeruginosa NOT DETECTED NOT DETECTED Final   Candida albicans NOT DETECTED NOT DETECTED Final   Candida glabrata NOT DETECTED NOT DETECTED Final   Candida krusei NOT DETECTED NOT DETECTED Final   Candida parapsilosis NOT DETECTED NOT DETECTED Final   Candida tropicalis NOT DETECTED NOT DETECTED Final    Comment: Performed at [facility], [address] [place], [place] [address]         Radiology Studies: Dg Chest Portable 1 View  Result Date: 02/17/2018 CLINICAL DATA:  84 y/o M; weakness, decreased level of consciousness. EXAM: PORTABLE CHEST 1 VIEW COMPARISON:  01/27/2018 chest radiograph FINDINGS: Stable normal cardiac silhouette given projection and technique. Aortic atherosclerosis with calcification. Clear lungs. No pleural effusion or pneumothorax. No acute osseous abnormality is evident. IMPRESSION: No active disease.  Electronically Signed   By: Mitzi Hansen M.D.   On: 02/17/2018 17:36        Scheduled Meds: Continuous Infusions:   LOS: 1 day    Time spent: 25 minutes.     Chaya Jan, MD [facility] Pager [phone]  If 7PM-7AM, please contact night-coverage [email or web address] Password [facility] 02/18/2018, 6:15 PM

## 2018-02-19 ENCOUNTER — Other Ambulatory Visit: Payer: Self-pay

## 2018-02-19 ENCOUNTER — Inpatient Hospital Stay (HOSPITAL_COMMUNITY)
Admission: RE | Admit: 2018-02-19 | Discharge: 2018-02-20 | Disposition: A | Discharge status: Hospice | Source: Ambulatory Visit | Attending: Internal Medicine | Admitting: Internal Medicine

## 2018-02-19 DIAGNOSIS — Z66 Do not resuscitate: Secondary | ICD-10-CM

## 2018-02-19 DIAGNOSIS — Z91018 Allergy to other foods: Secondary | ICD-10-CM

## 2018-02-19 DIAGNOSIS — Z87891 Personal history of nicotine dependence: Secondary | ICD-10-CM

## 2018-02-19 DIAGNOSIS — Z79899 Other long term (current) drug therapy: Secondary | ICD-10-CM

## 2018-02-19 DIAGNOSIS — N179 Acute kidney failure, unspecified: Secondary | ICD-10-CM

## 2018-02-19 DIAGNOSIS — Z515 Encounter for palliative care: Secondary | ICD-10-CM | POA: Diagnosis present

## 2018-02-19 DIAGNOSIS — Z7982 Long term (current) use of aspirin: Secondary | ICD-10-CM

## 2018-02-19 DIAGNOSIS — R64 Cachexia: Secondary | ICD-10-CM

## 2018-02-19 DIAGNOSIS — I10 Essential (primary) hypertension: Secondary | ICD-10-CM

## 2018-02-19 DIAGNOSIS — Z7401 Bed confinement status: Secondary | ICD-10-CM

## 2018-02-19 DIAGNOSIS — N390 Urinary tract infection, site not specified: Secondary | ICD-10-CM

## 2018-02-19 DIAGNOSIS — F0390 Unspecified dementia without behavioral disturbance: Secondary | ICD-10-CM

## 2018-02-19 DIAGNOSIS — A4150 Gram-negative sepsis, unspecified: Secondary | ICD-10-CM | POA: Diagnosis present

## 2018-02-19 DIAGNOSIS — Z8701 Personal history of pneumonia (recurrent): Secondary | ICD-10-CM

## 2018-02-19 DIAGNOSIS — A419 Sepsis, unspecified organism: Secondary | ICD-10-CM | POA: Diagnosis present

## 2018-02-19 LAB — CREATININE, SERUM
CREATININE: 1.12 mg/dL (ref 0.61–1.24)
GFR calc Af Amer: >60 mL/min (ref >60)
GFR, EST NON AFRICAN AMERICAN: 58 mL/min — AB (ref >60)

## 2018-02-19 MED ORDER — ONDANSETRON HCL 4 MG/2ML IJ SOLN: 4.0000 mg | Freq: Four times a day (QID) | INTRAMUSCULAR | Status: DC | PRN

## 2018-02-19 MED ORDER — ACETAMINOPHEN 325 MG PO TABS: 650.0000 mg | ORAL_TABLET | ORAL | Status: DC | PRN

## 2018-02-19 MED ORDER — GUAIFENESIN 100 MG/5ML PO SOLN: 200.0000 mg | Freq: Four times a day (QID) | ORAL | Status: DC | PRN

## 2018-02-19 MED ORDER — ONDANSETRON HCL 4 MG PO TABS: 4.0000 mg | ORAL_TABLET | Freq: Four times a day (QID) | ORAL | Status: DC | PRN

## 2018-02-19 MED ORDER — ACETAMINOPHEN 325 MG PO TABS: 650.0000 mg | ORAL_TABLET | Freq: Four times a day (QID) | ORAL | Status: DC | PRN

## 2018-02-19 MED ORDER — MORPHINE SULFATE (CONCENTRATE) 10 MG/0.5ML PO SOLN: 2.5000 mg | ORAL | Status: DC | PRN

## 2018-02-19 MED ORDER — LOPERAMIDE HCL 2 MG PO CAPS: 2.0000 mg | ORAL_CAPSULE | ORAL | Status: DC | PRN

## 2018-02-19 MED ORDER — ACETAMINOPHEN 650 MG RE SUPP: 650.0000 mg | Freq: Four times a day (QID) | RECTAL | Status: DC | PRN

## 2018-02-19 NOTE — Care Management Important Message (Signed)
Important Message  Patient Details  Name: Lenor DerrickJohn Murton MRN: [id number] Date of Birth: 09/06/1930   Medicare Important Message Given:  Yes    [name], Jorey Dollard Smith 02/19/2018, 12:20 PM

## 2018-02-19 NOTE — H&P (Signed)
History and Physical    Chad Everett WJX:[id number] DOB: 1931-04-07 DOA: 02/19/2018  Referring MD/NP/PA: Peggye Pitt, MD PCP: [facility]  Patient coming from: Inpatient hospital unit  Chief Complaint: GIP  HPI: Chad Everett is a 84 y.o. male who was discharged earlier today and readmitted under GIP status.  He has sepsis and gram-negative rod bacteremia, with advanced dementia and acute renal failure due to ongoing sepsis.  After discussion with patient's DSS social worker, we have decided to withdraw care and pursue comfort measures and residential hospice placement.  He is being readmitted under GIP status pending residential hospice bed availability.  Please see prior H&P and discharge summary for more details.  Past Medical/Surgical History: Past Medical History:  Diagnosis Date  . Anxiety   . Asthma   . Bedbound   . Dementia   . DNR (do not resuscitate) 02/01/2018  . GERD (gastroesophageal reflux disease)   . Hyperlipidemia   . Hypertension   . Pneumonia     Past Surgical History:  Procedure Laterality Date  . HIP PINNING,CANNULATED Right 11/19/2017   Procedure: INTERNAL FIXATION RIGHT HIP WITH DEPUY ACE CANNULATED SCREWS;  Surgeon: Vickki Hearing, MD;  Location: [facility];  Service: Orthopedics;  Laterality: Right;    Social History:  reports that he quit smoking about 34 years ago. His smoking use included cigarettes. He has a 10.00 pack-year smoking history. He has never used smokeless tobacco. He reports that he does not drink alcohol or use drugs.  Allergies: Allergies  Allergen Reactions  . Onion Other (See Comments)    Indigestion    Family History:  Family History  Problem Relation Age of Onset  . Stroke Sister     Prior to Admission medications   Medication Sig Start Date End Date Taking? Authorizing Provider  acetaminophen (TYLENOL) 325 MG tablet Take 650 mg by mouth every 4 (four) hours as needed for moderate pain.     [provider]  alum & mag hydroxide-simeth (MINTOX) 200-200-20 MG/5ML suspension Take 30 mLs by mouth daily as needed for indigestion or heartburn.    [provider]  aspirin 81 MG tablet Take 81 mg by mouth daily.    [provider]  atorvastatin (LIPITOR) 40 MG tablet Take 40 mg by mouth every evening.     [provider]  Coenzyme Q10 (CO Q-10 PO) Take 1 capsule by mouth daily.     [provider]  guaifenesin (ROBITUSSIN) 100 MG/5ML syrup Take 200 mg by mouth every 6 (six) hours as needed for cough.    [provider]  loperamide (IMODIUM) 2 MG capsule Take 2 mg by mouth as needed for diarrhea or loose stools.    [provider]  Nutritional Supplements (NUTRITIONAL SHAKE PO) Take 120 mLs by mouth 2 (two) times daily.    [provider]  omeprazole (PRILOSEC) 40 MG capsule Take 40 mg by mouth daily.     [provider]  psyllium (REGULOID) 0.52 g capsule Take 0.52 g by mouth daily.    [provider]    Review of Systems:  Unable to obtain given his obtunded state   Physical Exam: There were no vitals filed for this visit.   Patient is lying in bed, eyes closed, is cachectic, not very interactive.  Labs on Admission: I have personally reviewed the following labs and imaging studies  CBC: Recent Labs  Lab 02/17/18 1645  WBC 5.9  NEUTROABS 5.6  HGB 9.8*  HCT 30.8*  MCV 96.3  PLT 231   Basic Metabolic Panel: Recent Labs  Lab 02/17/18 1645 02/19/18 0509  NA 139  --   K 3.1*  --   CL 104  --   CO2 22  --   GLUCOSE 104*  --   BUN 31*  --   CREATININE 1.61* 1.12  CALCIUM 7.7*  --    GFR: Estimated Creatinine Clearance: 35.8 mL/min (by C-G formula based on SCr of 1.12 mg/dL). Liver Function Tests: Recent Labs  Lab 02/17/18 1645  AST 22  ALT 14  ALKPHOS 158*  BILITOT 1.0  PROT 7.1  ALBUMIN 2.2*   No results for input(s): LIPASE, AMYLASE in the last 168 hours. No  results for input(s): AMMONIA in the last 168 hours. Coagulation Profile: Recent Labs  Lab 02/17/18 1645  INR 1.36   Cardiac Enzymes: Recent Labs  Lab 02/17/18 1646  TROPONINI 0.06*   BNP (last 3 results) No results for input(s): PROBNP in the last 8760 hours. HbA1C: No results for input(s): HGBA1C in the last 72 hours. CBG: Recent Labs  Lab 02/18/18 0734  GLUCAP 85   Lipid Profile: No results for input(s): CHOL, HDL, LDLCALC, TRIG, CHOLHDL, LDLDIRECT in the last 72 hours. Thyroid Function Tests: No results for input(s): TSH, T4TOTAL, FREET4, T3FREE, THYROIDAB in the last 72 hours. Anemia Panel: No results for input(s): VITAMINB12, FOLATE, FERRITIN, TIBC, IRON, RETICCTPCT in the last 72 hours. Urine analysis:    Component Value Date/Time   COLORURINE AMBER (A) 02/17/2018 1700   APPEARANCEUR CLOUDY (A) 02/17/2018 1700   APPEARANCEUR Hazy 02/13/2012 1956   LABSPEC 1.010 02/17/2018 1700   LABSPEC 1.016 02/13/2012 1956   PHURINE 6.0 02/17/2018 1700   GLUCOSEU NEGATIVE 02/17/2018 1700   GLUCOSEU 150 mg/dL 82/95/6213 0865   HGBUR MODERATE (A) 02/17/2018 1700   BILIRUBINUR NEGATIVE 02/17/2018 1700   BILIRUBINUR Negative 02/13/2012 1956   KETONESUR NEGATIVE 02/17/2018 1700   PROTEINUR 30 (A) 02/17/2018 1700   UROBILINOGEN 0.2 10/22/2014 0932   NITRITE POSITIVE (A) 02/17/2018 1700   LEUKOCYTESUR LARGE (A) 02/17/2018 1700   LEUKOCYTESUR Negative 02/13/2012 1956   Sepsis Labs: @LABRCNTIP (procalcitonin:4,lacticidven:4) ) Recent Results (from the past 240 hour(s))  Culture, blood (Routine x 2)     Status: Abnormal (Preliminary result)   Collection Time: 02/17/18  4:45 PM  Result Value Ref Range Status   Specimen Description   Final    BLOOD RIGHT WRIST Performed at [facility], [address], [place], [place] [address]    Special Requests   Final    BOTTLES DRAWN AEROBIC AND ANAEROBIC Blood Culture adequate volume Performed at [facility], [address],  [place], [place] [address]    Culture  Setup Time   Final    BOTH BOTTLES GRAM NEGATIVE RODS Gram Stain Report Called to,Read Back By and Verified With: leslie martin 02/18/18 @ 0910 lschmidt    Culture ESCHERICHIA COLI (A)  Final   Report Status PENDING  Incomplete  Culture, blood (Routine x 2)     Status: None (Preliminary result)   Collection Time: 02/17/18  4:45 PM  Result Value Ref Range Status   Specimen Description   Final    BLOOD RIGHT FOREARM Performed at [facility], [address], [place], [place] [address]    Special Requests   Final    BOTTLES DRAWN AEROBIC AND ANAEROBIC Blood Culture adequate volume Performed at [facility], [address], [place], [place] [address]  Culture  Setup Time   Final    AEROBIC BOTTLE GRAM NEGATIVE RODS Gram Stain Report Called to,Read Back By and Verified With: Telford NabVALERIE SMITH 02/18/18 @ 1203 LSCHMIDT Performed at [facility], [address] [place], [place] [address]    Culture GRAM NEGATIVE RODS  Final   Report Status PENDING  Incomplete  Blood Culture ID Panel (Reflexed)     Status: Abnormal   Collection Time: 02/17/18  4:45 PM  Result Value Ref Range Status   Enterococcus species NOT DETECTED NOT DETECTED Final   Listeria monocytogenes NOT DETECTED NOT DETECTED Final   Staphylococcus species NOT DETECTED NOT DETECTED Final   Staphylococcus aureus NOT DETECTED NOT DETECTED Final   Streptococcus species NOT DETECTED NOT DETECTED Final   Streptococcus agalactiae NOT DETECTED NOT DETECTED Final   Streptococcus pneumoniae NOT DETECTED NOT DETECTED Final   Streptococcus pyogenes NOT DETECTED NOT DETECTED Final   Acinetobacter baumannii NOT DETECTED NOT DETECTED Final   Enterobacteriaceae species DETECTED (A) NOT DETECTED Final    Comment: Enterobacteriaceae represent a large family of gram-negative bacteria, not a single organism. CRITICAL RESULT CALLED TO, READ BACK BY AND VERIFIED WITH: Wallie RenshawL. Poole PharmD 12:40 02/18/18 ([email or web address])     Enterobacter cloacae complex NOT DETECTED NOT DETECTED Final   Escherichia coli DETECTED (A) NOT DETECTED Final    Comment: CRITICAL RESULT CALLED TO, READ BACK BY AND VERIFIED WITH: Wallie RenshawL. Poole PharmD 12:40 02/18/18 ([email or web address])    Klebsiella oxytoca NOT DETECTED NOT DETECTED Final   Klebsiella pneumoniae NOT DETECTED NOT DETECTED Final   Proteus species NOT DETECTED NOT DETECTED Final   Serratia marcescens NOT DETECTED NOT DETECTED Final   Carbapenem resistance NOT DETECTED NOT DETECTED Final   Haemophilus influenzae NOT DETECTED NOT DETECTED Final   Neisseria meningitidis NOT DETECTED NOT DETECTED Final   Pseudomonas aeruginosa NOT DETECTED NOT DETECTED Final   Candida albicans NOT DETECTED NOT DETECTED Final   Candida glabrata NOT DETECTED NOT DETECTED Final   Candida krusei NOT DETECTED NOT DETECTED Final   Candida parapsilosis NOT DETECTED NOT DETECTED Final   Candida tropicalis NOT DETECTED NOT DETECTED Final    Comment: Performed at [facility], [address] [place], [place] [address]  Urine culture     Status: Abnormal (Preliminary result)   Collection Time: 02/17/18  5:00 PM  Result Value Ref Range Status   Specimen Description   Final    URINE, CATHETERIZED Performed at [facility], [address] [place], [place] [address]    Special Requests   Final    Immunocompromised Performed at [facility], [address] [place], [place] [address]    Culture (A)  Final    >=100,000 COLONIES/mL ESCHERICHIA COLI SUSCEPTIBILITIES TO FOLLOW Performed at [facility], [address] [place], [place] [address]    Report Status PENDING  Incomplete     Radiological Exams on Admission: Dg Chest Portable 1 View  Result Date: 02/17/2018 CLINICAL DATA:  84 y/o M; weakness, decreased level of consciousness. EXAM: PORTABLE CHEST 1 VIEW COMPARISON:  01/27/2018 chest radiograph FINDINGS: Stable normal cardiac silhouette given projection and technique. Aortic atherosclerosis  with calcification. Clear lungs. No pleural effusion or pneumothorax. No acute osseous abnormality is evident. IMPRESSION: No active disease. Electronically Signed   By: Mitzi HansenLance  Furusawa-Stratton M.D.   On: 02/17/2018 17:36    EKG: Independently reviewed.  None obtained  Assessment/Plan Active Problems:   Sepsis secondary to UTI (HCC)    Sepsis secondary to  UTI -With gram-negative rod bacteremia. -Given extremely poor overall prognosis, severe dementia and bedbound state, after discussions with patient's DSS social worker we have decided to withdraw care and pursue comfort measures.  He has been readmitted under GIP status pending residential hospice placement.  Please see prior H&P and discharge summary for more details   DVT prophylaxis: None Code Status: DNR Family Communication: None Disposition Plan: Residential hospice Consults called: None  Admission status: GIP   Time Spent: 25 minutes  Estela Philip Aspen MD [facility] Pager [phone]  If 7PM-7AM, please contact night-coverage [email or web address] Password [facility]  02/19/2018, 5:01 PM

## 2018-02-19 NOTE — Discharge Summary (Signed)
Physician Discharge Summary  Chad Everett ZOX:[id number] DOB: 15-Jul-1931 DOA: 02/17/2018  PCP: [facility]  Admit date: 02/17/2018 Discharge date: 02/19/2018  Time spent: 45 minutes  Recommendations for Outpatient Follow-up:  -Patient will be discharged and readmitted under GIP status pending residential hospice placement.   Discharge Diagnoses:  Principal Problem:   Septic shock (HCC) Active Problems:   Dementia   COPD (chronic obstructive pulmonary disease) (HCC)   Chronic diastolic congestive heart failure (HCC)   Dehydration   AKI (acute kidney injury) (HCC)   Discharge Condition: [name] Weights   02/17/18 2040  Weight: 52.5 kg (115 lb 11.9 oz)    History of present illness:  As per Dr. Onalee Hua on 7/10: Chad Everett is a 84 y.o. male with medical history significant of dementia, bedbound state, hypertension, frequent infections recently hospitalized a little over a week ago and after discussions with his social worker was made DNR and sent to the nursing home with hospice.  He was sent back in due to more weakness than usual.  Patient is bedbound and contracted.  Patient cannot provide any history due to his advanced dementia.  Apparently Dr. Clarene Duke called the social worker who reports that patient is only DNR while in the hospital and that this is undone when he was recently sent to the nursing home.  Hospice was never arranged.  Social work recommended that patient's CODE STATUS be DNR if his physicians are agreeable to futility of care.  Per notes from [facility] our palliative care consulted discussions were made to only treat the treatable in this patient.  His life expectancy due to poor oral intake was less than a couple of weeks.  Patient is found to be in septic shock with acute kidney injury and is referred for admission for such.     Hospital Course:   Septic shock/gram-negative bacteremia -Patient is transitioning to comfort care due  to his extremely overall poor prognosis.  It is unclear the reasons as to why hospice was not set up after discharge. -Patient is currently a ward of the state and after discussion with patient's DSS representative we have transitioned him to comfort care status.  All antibiotics have been discontinued, comfort medications have been ordered. Will be transitioned over to GIP status pending residential hospice bed availability.  Advanced dementia Acute renal failure, due to ongoing sepsis    Procedures:  None   Consultations:  None  Discharge Instructions    Allergies  Allergen Reactions  . Onion Other (See Comments)    Indigestion      The results of significant diagnostics from this hospitalization (including imaging, microbiology, ancillary and laboratory) are listed below for reference.    Significant Diagnostic Studies: Dg Chest Portable 1 View  Result Date: 02/17/2018 CLINICAL DATA:  84 y/o M; weakness, decreased level of consciousness. EXAM: PORTABLE CHEST 1 VIEW COMPARISON:  01/27/2018 chest radiograph FINDINGS: Stable normal cardiac silhouette given projection and technique. Aortic atherosclerosis with calcification. Clear lungs. No pleural effusion or pneumothorax. No acute osseous abnormality is evident. IMPRESSION: No active disease. Electronically Signed   By: Mitzi Hansen M.D.   On: 02/17/2018 17:36   Dg Chest Port 1 View  Result Date: 01/27/2018 CLINICAL DATA:  Code sepsis.  History of dementia. EXAM: PORTABLE CHEST 1 VIEW COMPARISON:  Chest radiograph November 18, 2017 FINDINGS: Limited by patient positioning. Cardiomediastinal silhouette is normal. Calcified aortic knob. Patchy RIGHT upper lobe airspace opacity. Mild chronic interstitial  changes. No pleural effusion. No pneumothorax. Osteopenia. IMPRESSION: Patchy RIGHT upper lobe airspace opacity may be artifact, atelectasis or pneumonia. Aortic Atherosclerosis (ICD10-I70.0). Electronically Signed   By:  Awilda Metroourtnay  Bloomer M.D.   On: 01/27/2018 19:58   Dg Hip Unilat With Pelvis 2-3 Views Right  Result Date: 02/05/2018 [facility] report Dictated by Dr. Romeo AppleHarrison Chief complaint fracture right femoral neck As noted in the prior x-ray the fracture has stabilized making the pins more prominent.  The pins have not exited the bone.  Head penetration. Fracture appears to be healing. Impression status post internal fixation right femoral neck with 3 screws with washers, fracture is healing fracture has stabilized and slid along the screws as expected with screw has prominent lateral femur    Microbiology: Recent Results (from the past 240 hour(s))  Culture, blood (Routine x 2)     Status: Abnormal (Preliminary result)   Collection Time: 02/17/18  4:45 PM  Result Value Ref Range Status   Specimen Description   Final    BLOOD RIGHT WRIST Performed at [facility], [address] [place], [place] [address]    Special Requests   Final    BOTTLES DRAWN AEROBIC AND ANAEROBIC Blood Culture adequate volume Performed at [facility], [address] [place], [place] [address]    Culture  Setup Time   Final    BOTH BOTTLES GRAM NEGATIVE RODS Gram Stain Report Called to,Read Back By and Verified With: leslie martin 02/18/18 @ 0910 [email or web address]    Culture ESCHERICHIA COLI (A)  Final   Report Status PENDING  Incomplete  Culture, blood (Routine x 2)     Status: None (Preliminary result)   Collection Time: 02/17/18  4:45 PM  Result Value Ref Range Status   Specimen Description   Final    BLOOD RIGHT FOREARM Performed at [facility], [address] [place], [place] [address]    Special Requests   Final    BOTTLES DRAWN AEROBIC AND ANAEROBIC Blood Culture adequate volume Performed at [facility], [address] [place], [place] [address]    Culture  Setup Time   Final    AEROBIC BOTTLE GRAM NEGATIVE RODS Gram Stain Report Called to,Read Back By and Verified WithTelford Nab: VALERIE SMITH 02/18/18  @ 1203 [id number] Performed at [facility], [address] [place], [place] [address]    Culture GRAM NEGATIVE RODS  Final   Report Status PENDING  Incomplete  Blood Culture ID Panel (Reflexed)     Status: Abnormal   Collection Time: 02/17/18  4:45 PM  Result Value Ref Range Status   Enterococcus species NOT DETECTED NOT DETECTED Final   Listeria monocytogenes NOT DETECTED NOT DETECTED Final   Staphylococcus species NOT DETECTED NOT DETECTED Final   Staphylococcus aureus NOT DETECTED NOT DETECTED Final   Streptococcus species NOT DETECTED NOT DETECTED Final   Streptococcus agalactiae NOT DETECTED NOT DETECTED Final   Streptococcus pneumoniae NOT DETECTED NOT DETECTED Final   Streptococcus pyogenes NOT DETECTED NOT DETECTED Final   Acinetobacter baumannii NOT DETECTED NOT DETECTED Final   Enterobacteriaceae species DETECTED (A) NOT DETECTED Final    Comment: Enterobacteriaceae represent a large family of gram-negative bacteria, not a single organism. CRITICAL RESULT CALLED TO, READ BACK BY AND VERIFIED WITH: Wallie RenshawL. Poole PharmD 12:40 02/18/18 ([email or web address])    Enterobacter cloacae complex NOT DETECTED NOT DETECTED Final   Escherichia coli DETECTED (A) NOT DETECTED Final    Comment: CRITICAL RESULT CALLED TO, READ BACK BY AND VERIFIED WITH:  Wallie Renshaw PharmD 12:40 02/18/18 ([email or web address])    Klebsiella oxytoca NOT DETECTED NOT DETECTED Final   Klebsiella pneumoniae NOT DETECTED NOT DETECTED Final   Proteus species NOT DETECTED NOT DETECTED Final   Serratia marcescens NOT DETECTED NOT DETECTED Final   Carbapenem resistance NOT DETECTED NOT DETECTED Final   Haemophilus influenzae NOT DETECTED NOT DETECTED Final   Neisseria meningitidis NOT DETECTED NOT DETECTED Final   Pseudomonas aeruginosa NOT DETECTED NOT DETECTED Final   Candida albicans NOT DETECTED NOT DETECTED Final   Candida glabrata NOT DETECTED NOT DETECTED Final   Candida krusei NOT DETECTED NOT DETECTED Final   Candida parapsilosis NOT  DETECTED NOT DETECTED Final   Candida tropicalis NOT DETECTED NOT DETECTED Final    Comment: Performed at [facility], [address] [place], [place] [address]  Urine culture     Status: Abnormal (Preliminary result)   Collection Time: 02/17/18  5:00 PM  Result Value Ref Range Status   Specimen Description   Final    URINE, CATHETERIZED Performed at [facility], [address] [place], [place] [address]    Special Requests   Final    Immunocompromised Performed at [facility], [address] [place], [place] [address]    Culture (A)  Final    >=100,000 COLONIES/mL ESCHERICHIA COLI SUSCEPTIBILITIES TO FOLLOW Performed at [facility], [address] [place], [place] [address]    Report Status PENDING  Incomplete     Labs: Basic Metabolic Panel: Recent Labs  Lab 02/17/18 1645 02/19/18 0509  NA 139  --   K 3.1*  --   CL 104  --   CO2 22  --   GLUCOSE 104*  --   BUN 31*  --   CREATININE 1.61* 1.12  CALCIUM 7.7*  --    Liver Function Tests: Recent Labs  Lab 02/17/18 1645  AST 22  ALT 14  ALKPHOS 158*  BILITOT 1.0  PROT 7.1  ALBUMIN 2.2*   No results for input(s): LIPASE, AMYLASE in the last 168 hours. No results for input(s): AMMONIA in the last 168 hours. CBC: Recent Labs  Lab 02/17/18 1645  WBC 5.9  NEUTROABS 5.6  HGB 9.8*  HCT 30.8*  MCV 96.3  PLT 231   Cardiac Enzymes: Recent Labs  Lab 02/17/18 1646  TROPONINI 0.06*   BNP: BNP (last 3 results) No results for input(s): BNP in the last 8760 hours.  ProBNP (last 3 results) No results for input(s): PROBNP in the last 8760 hours.  CBG: Recent Labs  Lab 02/18/18 0734  GLUCAP 85       Signed:  Chaya Jan  [facility] Pager: [phone] 02/19/2018, 12:28 PM

## 2018-02-20 DIAGNOSIS — N390 Urinary tract infection, site not specified: Secondary | ICD-10-CM

## 2018-02-20 DIAGNOSIS — A419 Sepsis, unspecified organism: Principal | ICD-10-CM

## 2018-02-20 LAB — URINE CULTURE

## 2018-02-20 LAB — CULTURE, BLOOD (ROUTINE X 2)
SPECIAL REQUESTS: ADEQUATE
SPECIAL REQUESTS: ADEQUATE

## 2018-02-20 NOTE — Progress Notes (Signed)
Nonverbal but opened mouth to eat ice cream and drank well from straw.  IV removed and [facility].  Weldon InchesLucinda Wilson, legal guardian with DDS contacted by Bradd BurnerBonnie Stone concerning transfer.

## 2018-02-20 NOTE — Care Management (Signed)
Sp[facility] with Kendal [facility]Bonnie [facility].  Pt has a bed at hospice home and they have arranged transport.  No further intervention needed per Kendal HymenBonnie.  Delice Bisonara, CSW, advised.

## 2018-02-20 NOTE — Progress Notes (Signed)
Patient will Discharge To:[facility] Anticipated DC Date:02/20/18 Family Notified: Not able to make contact with legal guardian after several attempts Weldon Inches(Lucinda Wilson home-[phone]/cell [phone] Transport UJ:[id number]:[facility]   Per MD [facility] . RN, patient, patient's family, and facility notified of DC. Assessment, Fl2/Pasrr, and Discharge Summary sent to facility. RN given number for report [phone]). DC packet on chart. Ambulance transport requested for patient.   CSW signing off.  Budd Palmerara Kaari Zeigler LCSWA [phone]

## 2018-02-20 NOTE — Discharge Summary (Signed)
Physician Discharge Summary  Chad Everett ZOX:[id number] DOB: 11-27-1930 DOA: 02/19/2018  PCP: [facility]  Admit date: 02/19/2018 Discharge date: 02/20/2018  Admitted From: Home  Disposition:  Hospice   Recommendations for Outpatient Follow-up and new medication changes:  1. Patient will follow with hospice services.   Home Health: na  Equipment/Devices: na   Discharge Condition: stable  CODE STATUS: dnr   Diet recommendation: regular  Brief/Interim Summary: 84 year old male who was readmitted for a hospice placement.  Recent hospitalization for gram-negative rod bacteremia, advanced dementia, and renal failure.  Care has been withdrawn and patient is being transferred to hospice services due to poor prognosis.  1.  Gram-negative rod bacteremia due to urinary tract infection.  Stable during his hospitalization, arrangements were made and patient is being transferred to hospice services.   Discharge Diagnoses:  Active Problems:   Sepsis secondary to UTI [facility])    Discharge Instructions   Allergies as of 02/20/2018      Reactions   Onion Other (See Comments)   Indigestion      Medication List    STOP taking these medications   aspirin 81 MG tablet   atorvastatin 40 MG tablet Commonly known as:  LIPITOR   CO Q-10 PO     TAKE these medications   acetaminophen 325 MG tablet Commonly known as:  TYLENOL Take 650 mg by mouth every 4 (four) hours as needed for moderate pain.   guaifenesin 100 MG/5ML syrup Commonly known as:  ROBITUSSIN Take 200 mg by mouth every 6 (six) hours as needed for cough.   loperamide 2 MG capsule Commonly known as:  IMODIUM Take 2 mg by mouth as needed for diarrhea or loose stools.   MINTOX 200-200-20 MG/5ML suspension Generic drug:  alum & mag hydroxide-simeth Take 30 mLs by mouth daily as needed for indigestion or heartburn.   NUTRITIONAL SHAKE PO Take 120 mLs by mouth 2 (two) times daily.   omeprazole 40  MG capsule Commonly known as:  PRILOSEC Take 40 mg by mouth daily.   psyllium 0.52 g capsule Commonly known as:  REGULOID Take 0.52 g by mouth daily.       Allergies  Allergen Reactions  . Onion Other (See Comments)    Indigestion    Consultations:     Procedures/Studies: Dg Chest Portable 1 View  Result Date: 02/17/2018 CLINICAL DATA:  84 y/o M; weakness, decreased level of consciousness. EXAM: PORTABLE CHEST 1 VIEW COMPARISON:  01/27/2018 chest radiograph FINDINGS: Stable normal cardiac silhouette given projection and technique. Aortic atherosclerosis with calcification. Clear lungs. No pleural effusion or pneumothorax. No acute osseous abnormality is evident. IMPRESSION: No active disease. Electronically Signed   By: Mitzi Hansen M.D.   On: 02/17/2018 17:36   Dg Chest Port 1 View  Result Date: 01/27/2018 CLINICAL DATA:  Code sepsis.  History of dementia. EXAM: PORTABLE CHEST 1 VIEW COMPARISON:  Chest radiograph November 18, 2017 FINDINGS: Limited by patient positioning. Cardiomediastinal silhouette is normal. Calcified aortic knob. Patchy RIGHT upper lobe airspace opacity. Mild chronic interstitial changes. No pleural effusion. No pneumothorax. Osteopenia. IMPRESSION: Patchy RIGHT upper lobe airspace opacity may be artifact, atelectasis or pneumonia. Aortic Atherosclerosis (ICD10-I70.0). Electronically Signed   By: Awilda Metro M.D.   On: 01/27/2018 19:58   Dg Hip Unilat With Pelvis 2-3 Views Right  Result Date: 02/05/2018 [facility] Radiology report Dictated by Dr. Romeo Apple Chief complaint fracture right femoral neck As noted in the prior x-ray the fracture  has stabilized making the pins more prominent.  The pins have not exited the bone.  Head penetration. Fracture appears to be healing. Impression status post internal fixation right femoral neck with 3 screws with washers, fracture is healing fracture has stabilized and slid along the screws as  expected with screw has prominent lateral femur       Subjective: Patient is not interactive, not in apparent pain or dyspnea. No nausea or vomiting.   Discharge Exam: Vitals:   02/19/18 2328 02/20/18 0611  BP: (!) 159/87 (!) 147/91  Pulse: 86 91  Resp: 20 18  Temp: 98.4 F (36.9 C) 98.2 F (36.8 C)  SpO2: 92% 98%   Vitals:   02/19/18 2056 02/19/18 2328 02/20/18 0611  BP:  (!) 159/87 (!) 147/91  Pulse:  86 91  Resp:  20 18  Temp:  98.4 F (36.9 C) 98.2 F (36.8 C)  TempSrc:  Oral Oral  SpO2: 93% 92% 98%    General: deconditioned and ill looking appaering.  Neurology: Somnolent.  E ENT: mild pallor, no icterus, oral mucosa moist Cardiovascular: No JVD. S1-S2 present, rhythmic, no gallops, rubs, or murmurs. No lower extremity edema. Pulmonary: positive breath sounds bilaterally, decreased air movement, no wheezing, but scattered rhonchi and rales. Gastrointestinal. Abdomen with no organomegaly, non tender, no rebound or guarding Skin. No rashes Musculoskeletal: no joint deformities   The results of significant diagnostics from this hospitalization (including imaging, microbiology, ancillary and laboratory) are listed below for reference.     Microbiology: Recent Results (from the past 240 hour(s))  Culture, blood (Routine x 2)     Status: Abnormal   Collection Time: 02/17/18  4:45 PM  Result Value Ref Range Status   Specimen Description   Final    BLOOD RIGHT WRIST Performed at [facility], [address] [place], [place] [address]    Special Requests   Final    BOTTLES DRAWN AEROBIC AND ANAEROBIC Blood Culture adequate volume Performed at [facility], [address], [place], [place] [address]    Culture  Setup Time   Final    IN BOTH AEROBIC AND ANAEROBIC BOTTLES GRAM NEGATIVE RODS Gram Stain Report Called to,Read Back By and Verified With: leslie martin 02/18/18 @ 0910 lschmidt    Culture ESCHERICHIA COLI (A)  Final   Report Status 02/20/2018 FINAL   Final   Organism ID, Bacteria ESCHERICHIA COLI  Final      Susceptibility   Escherichia coli - MIC*    AMPICILLIN <=2 SENSITIVE Sensitive     CEFAZOLIN <=4 SENSITIVE Sensitive     CEFEPIME <=1 SENSITIVE Sensitive     CEFTAZIDIME <=1 SENSITIVE Sensitive     CEFTRIAXONE <=1 SENSITIVE Sensitive     CIPROFLOXACIN <=0.25 SENSITIVE Sensitive     GENTAMICIN <=1 SENSITIVE Sensitive     IMIPENEM <=0.25 SENSITIVE Sensitive     TRIMETH/SULFA <=20 SENSITIVE Sensitive     AMPICILLIN/SULBACTAM <=2 SENSITIVE Sensitive     PIP/TAZO <=4 SENSITIVE Sensitive     Extended ESBL NEGATIVE Sensitive     * ESCHERICHIA COLI  Culture, blood (Routine x 2)     Status: Abnormal   Collection Time: 02/17/18  4:45 PM  Result Value Ref Range Status   Specimen Description   Final    BLOOD RIGHT FOREARM Performed at [facility], [address], [place], [place] [address]    Special Requests   Final    BOTTLES DRAWN AEROBIC AND ANAEROBIC Blood Culture adequate volume Performed at  [facility], [address], [place], [place] [address]    Culture  Setup Time   Final    AEROBIC BOTTLE GRAM NEGATIVE RODS Gram Stain Report Called to,Read Back By and Verified With: Telford Nab 02/18/18 @ 1203 LSCHMIDT Performed at [facility], [address], [place], [place] [address]    Culture (A)  Final    ESCHERICHIA COLI SUSCEPTIBILITIES PERFORMED ON PREVIOUS CULTURE WITHIN THE LAST 5 DAYS. Performed at [facility], [address], [place], [place] [address]    Report Status 02/20/2018 FINAL  Final  Blood Culture ID Panel (Reflexed)     Status: Abnormal   Collection Time: 02/17/18  4:45 PM  Result Value Ref Range Status   Enterococcus species NOT DETECTED NOT DETECTED Final   Listeria monocytogenes NOT DETECTED NOT DETECTED Final   Staphylococcus species NOT DETECTED NOT DETECTED Final   Staphylococcus aureus NOT DETECTED NOT DETECTED Final   Streptococcus species NOT DETECTED NOT DETECTED Final   Streptococcus  agalactiae NOT DETECTED NOT DETECTED Final   Streptococcus pneumoniae NOT DETECTED NOT DETECTED Final   Streptococcus pyogenes NOT DETECTED NOT DETECTED Final   Acinetobacter baumannii NOT DETECTED NOT DETECTED Final   Enterobacteriaceae species DETECTED (A) NOT DETECTED Final    Comment: Enterobacteriaceae represent a large family of gram-negative bacteria, not a single organism. CRITICAL RESULT CALLED TO, READ BACK BY AND VERIFIED WITH: Wallie Renshaw PharmD 12:40 02/18/18 ([email or web address])    Enterobacter cloacae complex NOT DETECTED NOT DETECTED Final   Escherichia coli DETECTED (A) NOT DETECTED Final    Comment: CRITICAL RESULT CALLED TO, READ BACK BY AND VERIFIED WITH: Wallie Renshaw PharmD 12:40 02/18/18 ([email or web address])    Klebsiella oxytoca NOT DETECTED NOT DETECTED Final   Klebsiella pneumoniae NOT DETECTED NOT DETECTED Final   Proteus species NOT DETECTED NOT DETECTED Final   Serratia marcescens NOT DETECTED NOT DETECTED Final   Carbapenem resistance NOT DETECTED NOT DETECTED Final   Haemophilus influenzae NOT DETECTED NOT DETECTED Final   Neisseria meningitidis NOT DETECTED NOT DETECTED Final   Pseudomonas aeruginosa NOT DETECTED NOT DETECTED Final   Candida albicans NOT DETECTED NOT DETECTED Final   Candida glabrata NOT DETECTED NOT DETECTED Final   Candida krusei NOT DETECTED NOT DETECTED Final   Candida parapsilosis NOT DETECTED NOT DETECTED Final   Candida tropicalis NOT DETECTED NOT DETECTED Final    Comment: Performed at [facility], [address], [place], [place] [address]  Urine culture     Status: Abnormal   Collection Time: 02/17/18  5:00 PM  Result Value Ref Range Status   Specimen Description   Final    URINE, CATHETERIZED Performed at [facility], [address], [place], [place] [address]    Special Requests   Final    Immunocompromised Performed at [facility], [address], [place], [place] [address]    Culture >=100,000 COLONIES/mL ESCHERICHIA COLI (A)  Final    Report Status 02/20/2018 FINAL  Final   Organism ID, Bacteria ESCHERICHIA COLI (A)  Final      Susceptibility   Escherichia coli - MIC*    AMPICILLIN <=2 SENSITIVE Sensitive     CEFAZOLIN <=4 SENSITIVE Sensitive     CEFTRIAXONE <=1 SENSITIVE Sensitive     CIPROFLOXACIN <=0.25 SENSITIVE Sensitive     GENTAMICIN <=1 SENSITIVE Sensitive     IMIPENEM <=0.25 SENSITIVE Sensitive     NITROFURANTOIN <=16 SENSITIVE Sensitive     TRIMETH/SULFA <=20 SENSITIVE Sensitive  AMPICILLIN/SULBACTAM <=2 SENSITIVE Sensitive     PIP/TAZO <=4 SENSITIVE Sensitive     Extended ESBL NEGATIVE Sensitive     * >=100,000 COLONIES/mL ESCHERICHIA COLI     Labs: BNP (last 3 results) No results for input(s): BNP in the last 8760 hours. Basic Metabolic Panel: Recent Labs  Lab 02/17/18 1645 02/19/18 0509  NA 139  --   K 3.1*  --   CL 104  --   CO2 22  --   GLUCOSE 104*  --   BUN 31*  --   CREATININE 1.61* 1.12  CALCIUM 7.7*  --    Liver Function Tests: Recent Labs  Lab 02/17/18 1645  AST 22  ALT 14  ALKPHOS 158*  BILITOT 1.0  PROT 7.1  ALBUMIN 2.2*   No results for input(s): LIPASE, AMYLASE in the last 168 hours. No results for input(s): AMMONIA in the last 168 hours. CBC: Recent Labs  Lab 02/17/18 1645  WBC 5.9  NEUTROABS 5.6  HGB 9.8*  HCT 30.8*  MCV 96.3  PLT 231   Cardiac Enzymes: Recent Labs  Lab 02/17/18 1646  TROPONINI 0.06*   BNP: Invalid input(s): POCBNP CBG: Recent Labs  Lab 02/18/18 0734  GLUCAP 85   D-Dimer No results for input(s): DDIMER in the last 72 hours. Hgb A1c No results for input(s): HGBA1C in the last 72 hours. Lipid Profile No results for input(s): CHOL, HDL, LDLCALC, TRIG, CHOLHDL, LDLDIRECT in the last 72 hours. Thyroid function studies No results for input(s): TSH, T4TOTAL, T3FREE, THYROIDAB in the last 72 hours.  Invalid input(s): FREET3 Anemia work up No results for input(s): VITAMINB12, FOLATE, FERRITIN, TIBC, IRON, RETICCTPCT in the  last 72 hours. Urinalysis    Component Value Date/Time   COLORURINE AMBER (A) 02/17/2018 1700   APPEARANCEUR CLOUDY (A) 02/17/2018 1700   APPEARANCEUR Hazy 02/13/2012 1956   LABSPEC 1.010 02/17/2018 1700   LABSPEC 1.016 02/13/2012 1956   PHURINE 6.0 02/17/2018 1700   GLUCOSEU NEGATIVE 02/17/2018 1700   GLUCOSEU 150 mg/dL 16/05/9603 5409   HGBUR MODERATE (A) 02/17/2018 1700   BILIRUBINUR NEGATIVE 02/17/2018 1700   BILIRUBINUR Negative 02/13/2012 1956   KETONESUR NEGATIVE 02/17/2018 1700   PROTEINUR 30 (A) 02/17/2018 1700   UROBILINOGEN 0.2 10/22/2014 0932   NITRITE POSITIVE (A) 02/17/2018 1700   LEUKOCYTESUR LARGE (A) 02/17/2018 1700   LEUKOCYTESUR Negative 02/13/2012 1956   Sepsis Labs Invalid input(s): PROCALCITONIN,  WBC,  LACTICIDVEN Microbiology Recent Results (from the past 240 hour(s))  Culture, blood (Routine x 2)     Status: Abnormal   Collection Time: 02/17/18  4:45 PM  Result Value Ref Range Status   Specimen Description   Final    BLOOD RIGHT WRIST Performed at [facility], [address], [place], [place] [address]    Special Requests   Final    BOTTLES DRAWN AEROBIC AND ANAEROBIC Blood Culture adequate volume Performed at [facility], [address], [place], [place] [address]    Culture  Setup Time   Final    IN BOTH AEROBIC AND ANAEROBIC BOTTLES GRAM NEGATIVE RODS Gram Stain Report Called to,Read Back By and Verified With: leslie martin 02/18/18 @ 0910 [email or web address]    Culture ESCHERICHIA COLI (A)  Final   Report Status 02/20/2018 FINAL  Final   Organism ID, Bacteria ESCHERICHIA COLI  Final      Susceptibility   Escherichia coli - MIC*    AMPICILLIN <=2 SENSITIVE Sensitive     CEFAZOLIN <=4 SENSITIVE Sensitive  CEFEPIME <=1 SENSITIVE Sensitive     CEFTAZIDIME <=1 SENSITIVE Sensitive     CEFTRIAXONE <=1 SENSITIVE Sensitive     CIPROFLOXACIN <=0.25 SENSITIVE Sensitive     GENTAMICIN <=1 SENSITIVE Sensitive     IMIPENEM <=0.25 SENSITIVE Sensitive      TRIMETH/SULFA <=20 SENSITIVE Sensitive     AMPICILLIN/SULBACTAM <=2 SENSITIVE Sensitive     PIP/TAZO <=4 SENSITIVE Sensitive     Extended ESBL NEGATIVE Sensitive     * ESCHERICHIA COLI  Culture, blood (Routine x 2)     Status: Abnormal   Collection Time: 02/17/18  4:45 PM  Result Value Ref Range Status   Specimen Description   Final    BLOOD RIGHT FOREARM Performed at [facility], [address] [place], [place] [address]    Special Requests   Final    BOTTLES DRAWN AEROBIC AND ANAEROBIC Blood Culture adequate volume Performed at [facility], [address] [place], [place] [address]    Culture  Setup Time   Final    AEROBIC BOTTLE GRAM NEGATIVE RODS Gram Stain Report Called to,Read Back By and Verified WithTelford Nab: VALERIE SMITH 02/18/18 @ 1203 LSCHMIDT Performed at [facility], [address] [place], [place] [address]    Culture (A)  Final    ESCHERICHIA COLI SUSCEPTIBILITIES PERFORMED ON PREVIOUS CULTURE WITHIN THE LAST 5 DAYS. Performed at [facility], [address] [place], [place] [address]    Report Status 02/20/2018 FINAL  Final  Blood Culture ID Panel (Reflexed)     Status: Abnormal   Collection Time: 02/17/18  4:45 PM  Result Value Ref Range Status   Enterococcus species NOT DETECTED NOT DETECTED Final   Listeria monocytogenes NOT DETECTED NOT DETECTED Final   Staphylococcus species NOT DETECTED NOT DETECTED Final   Staphylococcus aureus NOT DETECTED NOT DETECTED Final   Streptococcus species NOT DETECTED NOT DETECTED Final   Streptococcus agalactiae NOT DETECTED NOT DETECTED Final   Streptococcus pneumoniae NOT DETECTED NOT DETECTED Final   Streptococcus pyogenes NOT DETECTED NOT DETECTED Final   Acinetobacter baumannii NOT DETECTED NOT DETECTED Final   Enterobacteriaceae species DETECTED (A) NOT DETECTED Final    Comment: Enterobacteriaceae represent a large family of gram-negative bacteria, not a single organism. CRITICAL RESULT CALLED TO, READ BACK BY AND  VERIFIED WITH: Wallie RenshawL. Poole PharmD 12:40 02/18/18 ([email or web address])    Enterobacter cloacae complex NOT DETECTED NOT DETECTED Final   Escherichia coli DETECTED (A) NOT DETECTED Final    Comment: CRITICAL RESULT CALLED TO, READ BACK BY AND VERIFIED WITH: Wallie RenshawL. Poole PharmD 12:40 02/18/18 ([email or web address])    Klebsiella oxytoca NOT DETECTED NOT DETECTED Final   Klebsiella pneumoniae NOT DETECTED NOT DETECTED Final   Proteus species NOT DETECTED NOT DETECTED Final   Serratia marcescens NOT DETECTED NOT DETECTED Final   Carbapenem resistance NOT DETECTED NOT DETECTED Final   Haemophilus influenzae NOT DETECTED NOT DETECTED Final   Neisseria meningitidis NOT DETECTED NOT DETECTED Final   Pseudomonas aeruginosa NOT DETECTED NOT DETECTED Final   Candida albicans NOT DETECTED NOT DETECTED Final   Candida glabrata NOT DETECTED NOT DETECTED Final   Candida krusei NOT DETECTED NOT DETECTED Final   Candida parapsilosis NOT DETECTED NOT DETECTED Final   Candida tropicalis NOT DETECTED NOT DETECTED Final    Comment: Performed at [facility], [address] [place], [place] [address]  Urine culture     Status: Abnormal   Collection Time: 02/17/18  5:00 PM  Result Value Ref Range  Status   Specimen Description   Final    URINE, CATHETERIZED Performed at [facility], [address], [place], [place] [address]    Special Requests   Final    Immunocompromised Performed at [facility], [address], [place], [place] [address]    Culture >=100,000 COLONIES/mL ESCHERICHIA COLI (A)  Final   Report Status 02/20/2018 FINAL  Final   Organism ID, Bacteria ESCHERICHIA COLI (A)  Final      Susceptibility   Escherichia coli - MIC*    AMPICILLIN <=2 SENSITIVE Sensitive     CEFAZOLIN <=4 SENSITIVE Sensitive     CEFTRIAXONE <=1 SENSITIVE Sensitive     CIPROFLOXACIN <=0.25 SENSITIVE Sensitive     GENTAMICIN <=1 SENSITIVE Sensitive     IMIPENEM <=0.25 SENSITIVE Sensitive     NITROFURANTOIN <=16 SENSITIVE Sensitive      TRIMETH/SULFA <=20 SENSITIVE Sensitive     AMPICILLIN/SULBACTAM <=2 SENSITIVE Sensitive     PIP/TAZO <=4 SENSITIVE Sensitive     Extended ESBL NEGATIVE Sensitive     * >=100,000 COLONIES/mL ESCHERICHIA COLI     Time coordinating discharge: 45 minutes  SIGNED:   Coralie Keens, MD  [facility] 02/20/2018, 2:26 PM Pager [phone]  If 7PM-7AM, please contact night-coverage [email or web address] Password [email or web address]

## 2018-02-20 NOTE — Plan of Care (Signed)
Noted  

## 2018-03-01 ENCOUNTER — Telehealth: Payer: Self-pay | Admitting: Orthopedic Surgery

## 2018-03-01 NOTE — Telephone Encounter (Signed)
Per Weldon InchesLucinda Everett, [place] DSS, we need to cancel the appointment for 03/31/18.  [name] passed away this morning

## 2018-03-11 DEATH — deceased

## 2018-03-24 ENCOUNTER — Ambulatory Visit: Payer: Medicare Other | Admitting: Orthopedic Surgery

## 2018-03-31 ENCOUNTER — Ambulatory Visit: Payer: Medicare Other | Admitting: Orthopedic Surgery

## 2019-09-11 IMAGING — CT CT HIP*R* W/O CM
2 of 3 series · 17 of 46 positions shown, 19 images · non-contrast
Comparison: Same day radiographs of the right hip.

CLINICAL DATA: Patient fell today. Unable to straighten legs. Right
hip fracture.

EXAM:
CT OF THE RIGHT HIP WITHOUT CONTRAST
TECHNIQUE: Multidetector CT imaging of the right hip was performed according to
the standard protocol. Multiplanar CT image reconstructions were
also generated.

[Series 3: axial st · axial · 0.44mm/px · z∈[+878,+1052]mm · 14 of 101 slices shown, 16 images]
[im 7/101  soft-tissue]
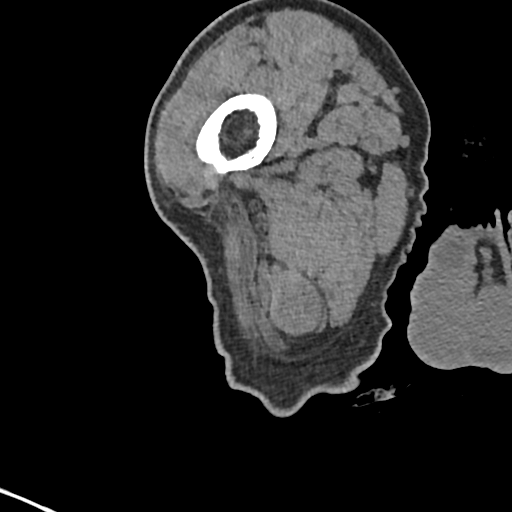
[im 7/101  bone]
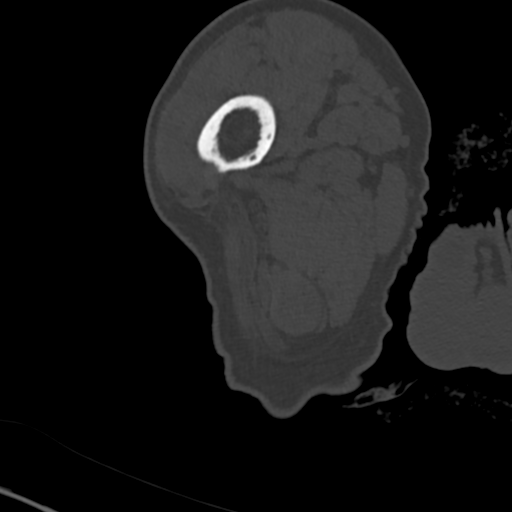
[im 13/101  soft-tissue]
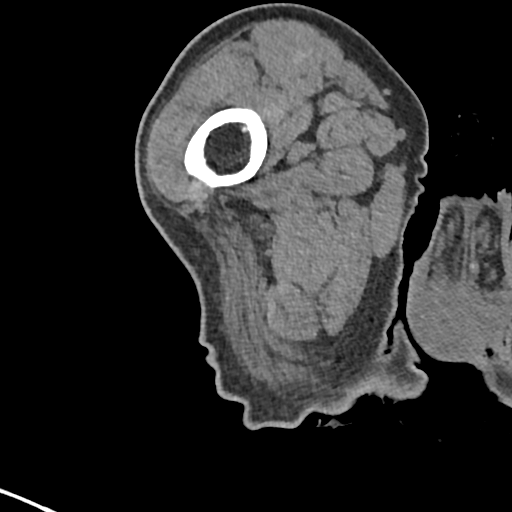
[im 20/101  soft-tissue]
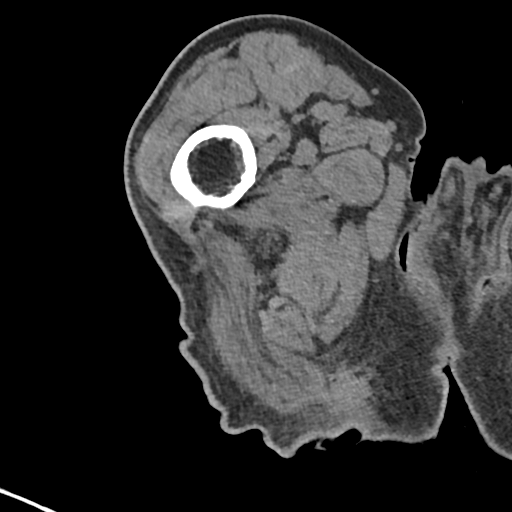
[im 26/101  soft-tissue]
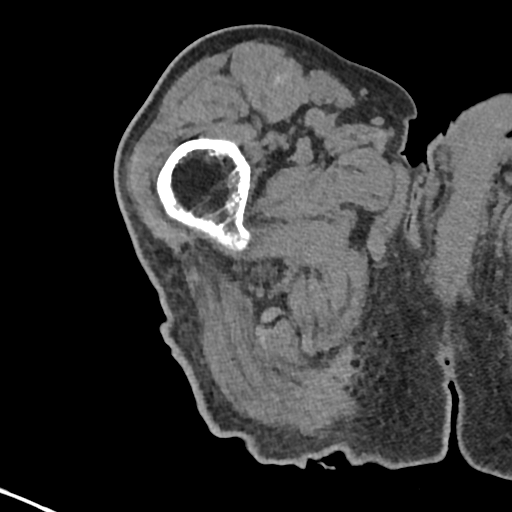
[im 33/101  soft-tissue]
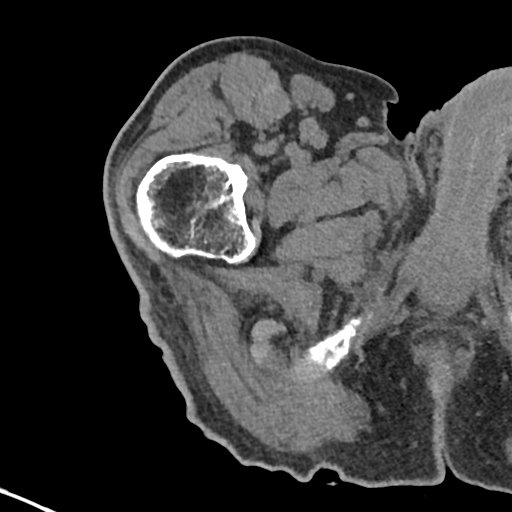
[im 39/101  soft-tissue]
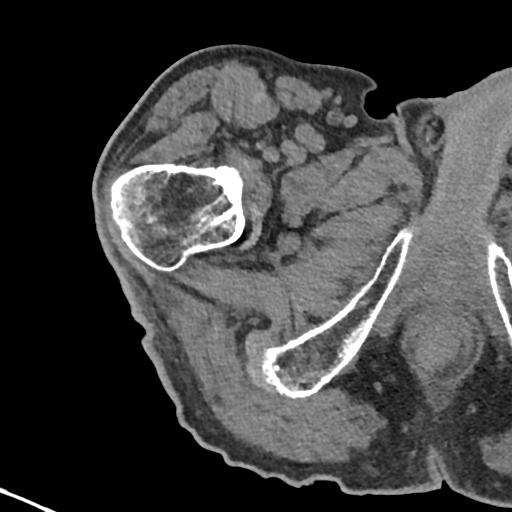
[im 46/101  soft-tissue]
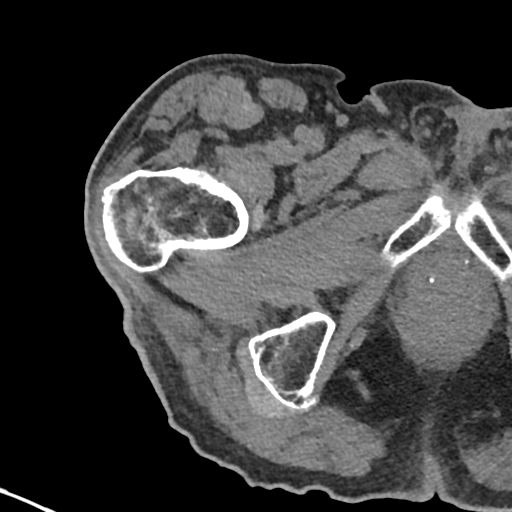
[im 55/101  soft-tissue]
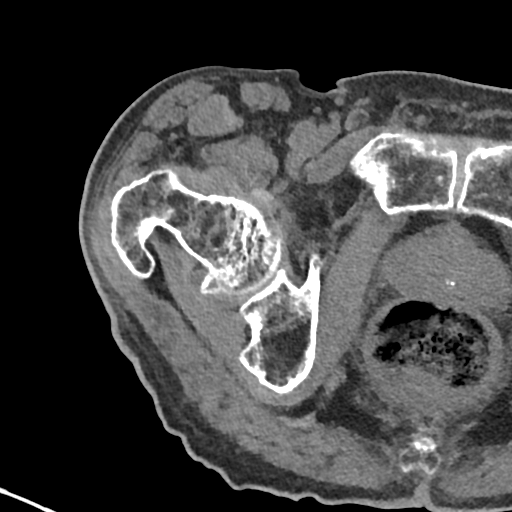
[im 62/101  soft-tissue]
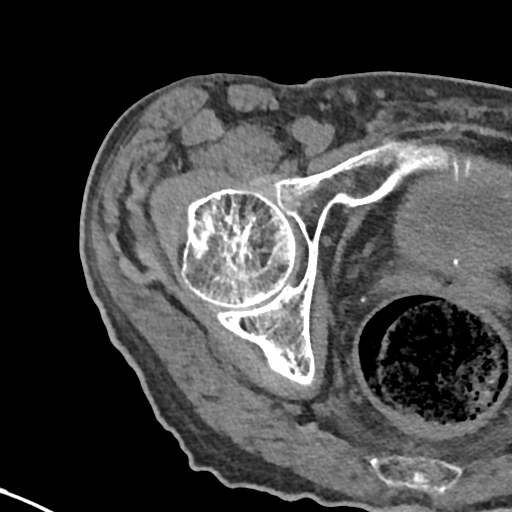
[im 62/101  bone]
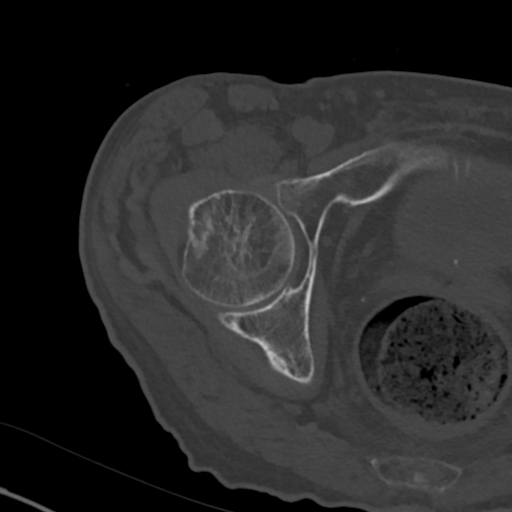
[im 68/101  soft-tissue]
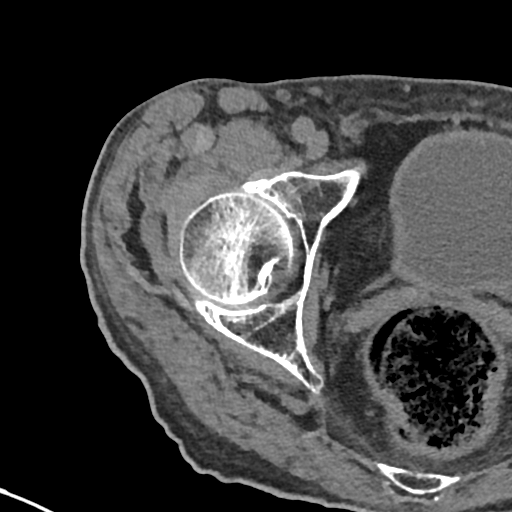
[im 75/101  soft-tissue]
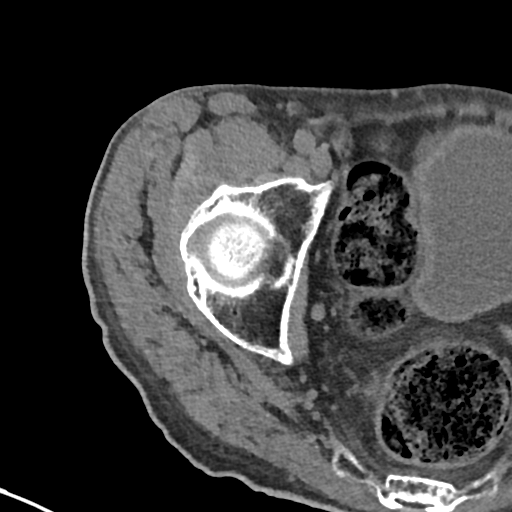
[im 81/101  soft-tissue]
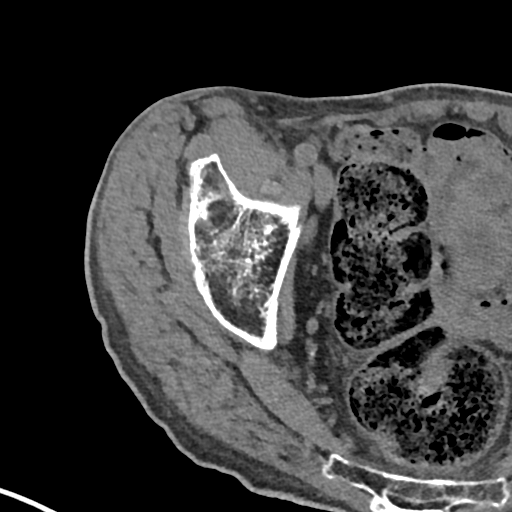
[im 88/101  soft-tissue]
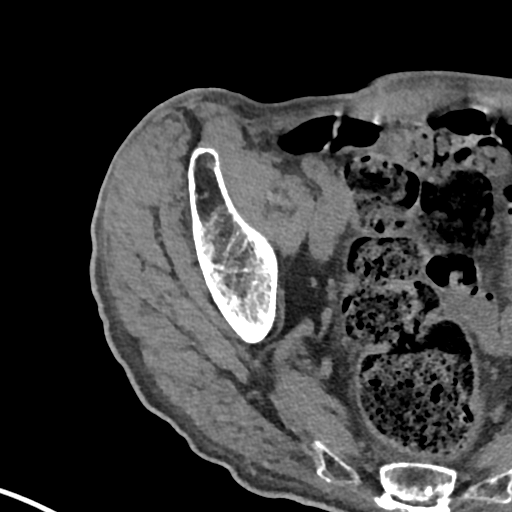
[im 94/101  soft-tissue]
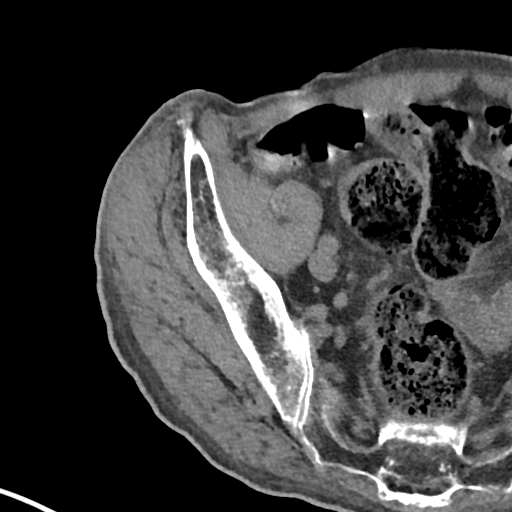

[Series 8: coronal st · coronal · 0.42mm/px · 3 of 94 slices shown]
[im 32/94  soft-tissue]
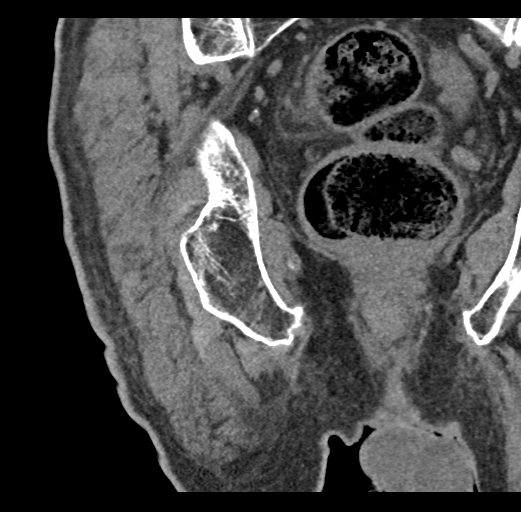
[im 42/94  soft-tissue]
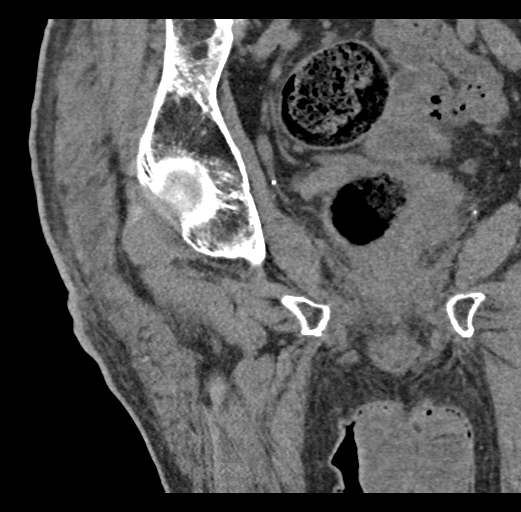
[im 52/94  soft-tissue]
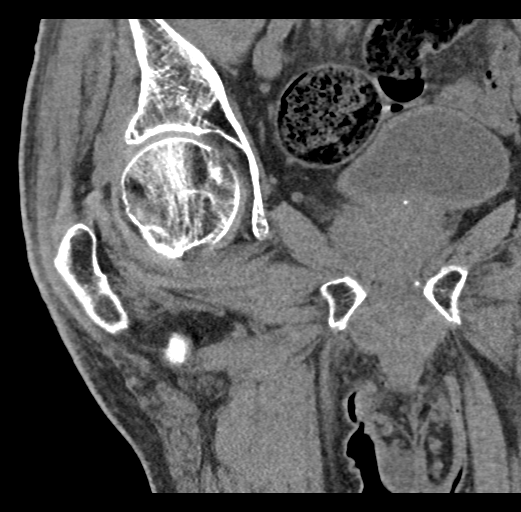

[17 of 46 positions shown; findings below may reference images not displayed]

FINDINGS: Bones/Joint/Cartilage

Acute subcapital right femoral neck fracture with impaction of the
neck on the head along its superolateral aspect is identified,
series [DATE], series 2/42. Associated small joint effusion. No joint
dislocation. No acetabular or pubic rami fracture. The included
sacrum and coccyx are intact. No pelvic diastasis.

Ligaments

Suboptimally assessed by CT.

Muscles and Tendons

No intramuscular hemorrhage or atrophy.

Soft tissues

Soft tissue edema compatible with soft tissue contusion overlying
the right hip. No focal soft tissue hematoma. Moderate fecal
retention within the included colon and rectum.
IMPRESSION: 1. Acute, closed, subcapital right femoral neck fracture with
impaction of the femoral neck on the femoral head along its
superolateral aspect. Small joint effusion. No joint dislocation.
2. Increased stool burden within the included colon and rectum.

## 2019-11-20 IMAGING — CR DG CHEST 1V PORT
2 series · 2 of 2 positions shown · non-contrast
Comparison: Chest radiograph November 18, 2017

CLINICAL DATA: Code sepsis.  History of dementia.

EXAM:
PORTABLE CHEST 1 VIEW

[portable (1 of 2)]
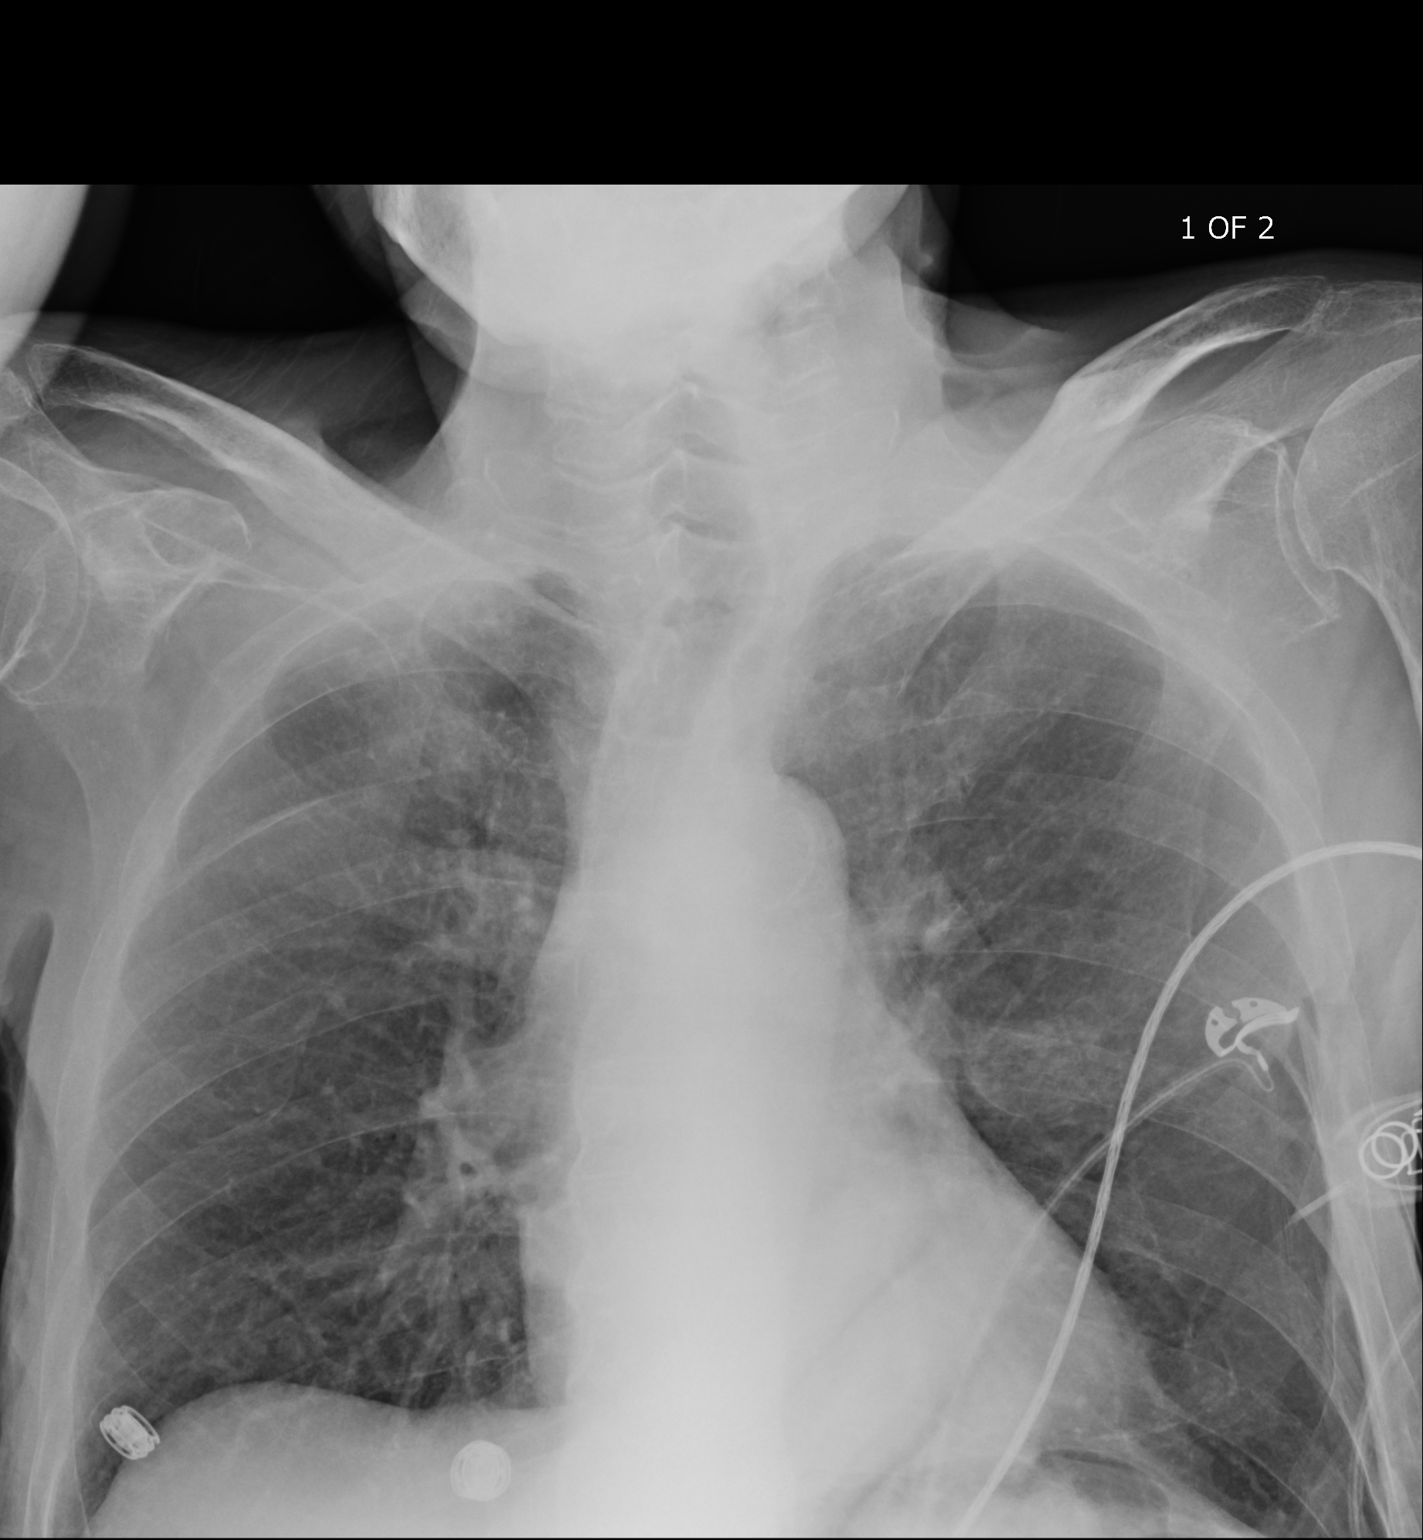

[portable (2 of 2)]
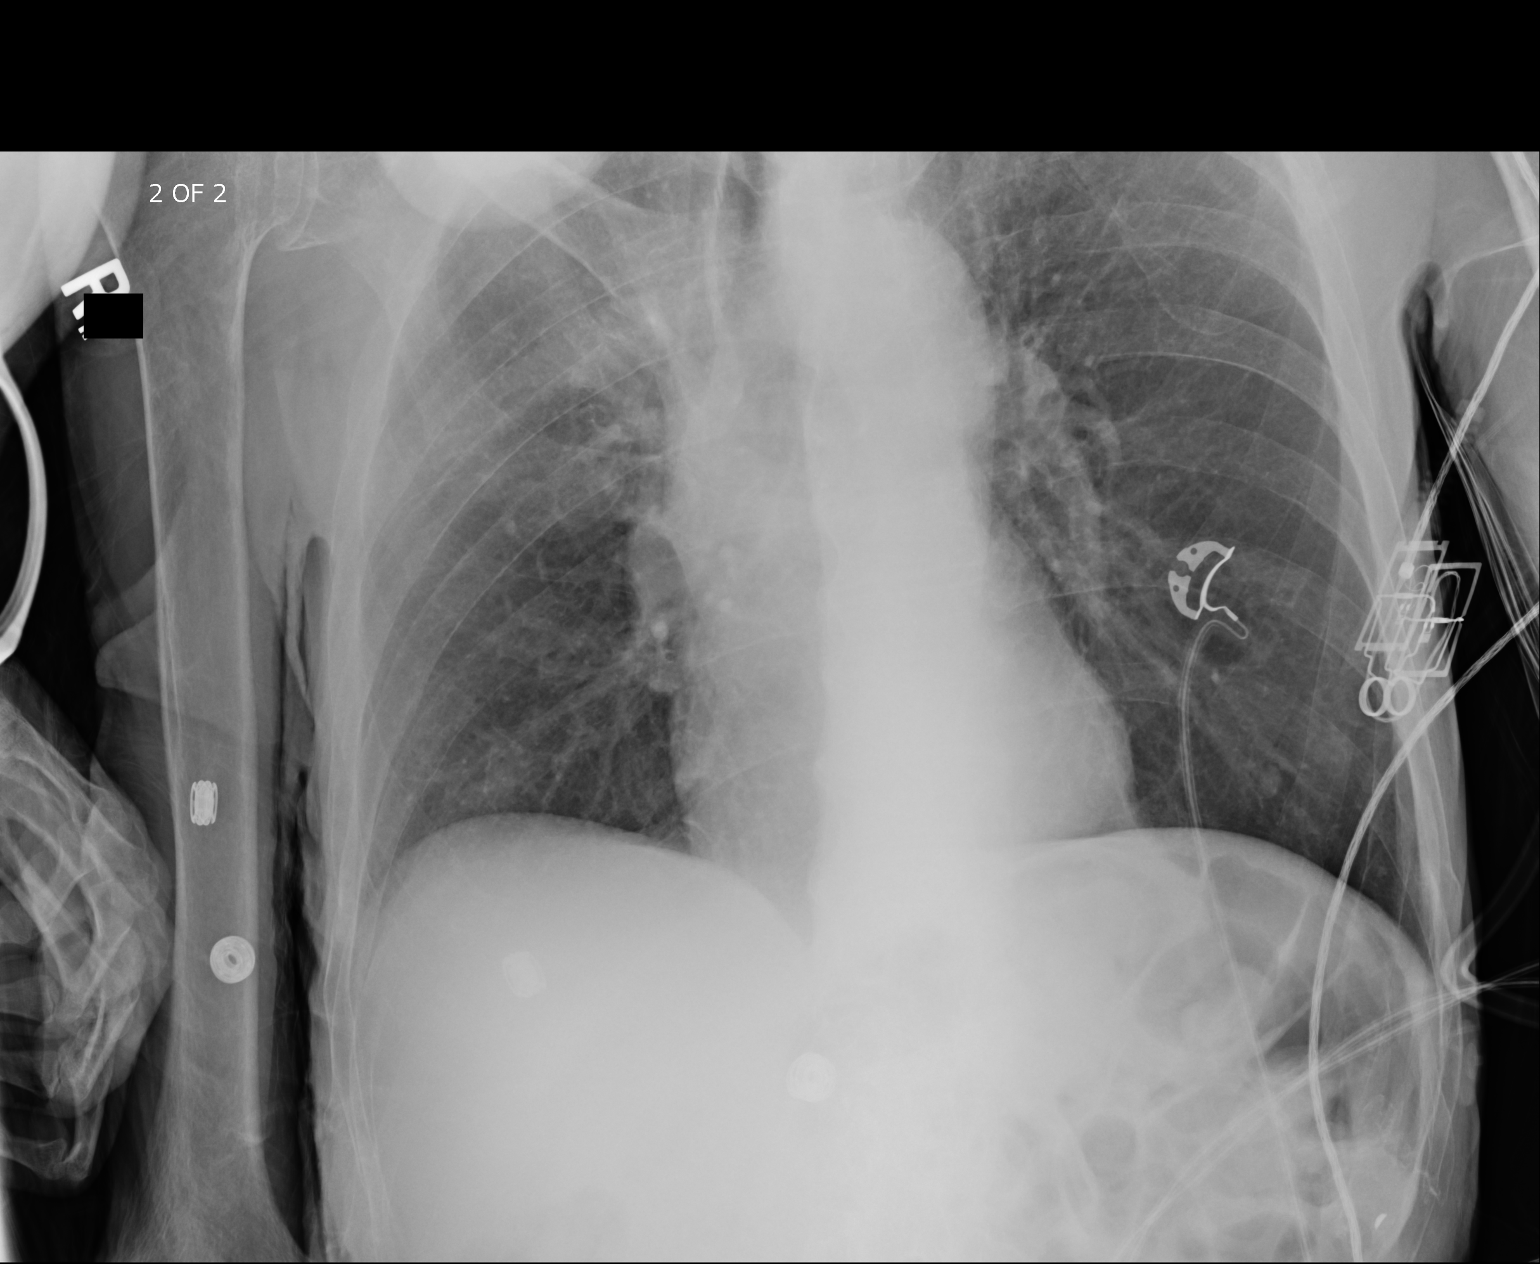

[2 of 2 positions shown; findings below may reference images not displayed]

FINDINGS: Limited by patient positioning. Cardiomediastinal silhouette is
normal. Calcified aortic knob. Patchy RIGHT upper lobe airspace
opacity. Mild chronic interstitial changes. No pleural effusion. No
pneumothorax. Osteopenia.
IMPRESSION: Patchy RIGHT upper lobe airspace opacity may be artifact,
atelectasis or pneumonia.

Aortic Atherosclerosis ([id number]).
